# Patient Record
Sex: Female | Born: 1950 | Race: Black or African American | Hispanic: No | Smoking: Never smoker
Health system: Southern US, Community
[De-identification: ages and names within clinical notes are randomized; demographics above are authoritative.]

## PROBLEM LIST (undated history)

## (undated) DIAGNOSIS — K219 Gastro-esophageal reflux disease without esophagitis: Secondary | ICD-10-CM

## (undated) DIAGNOSIS — D126 Benign neoplasm of colon, unspecified: Secondary | ICD-10-CM

## (undated) DIAGNOSIS — M810 Age-related osteoporosis without current pathological fracture: Secondary | ICD-10-CM

## (undated) DIAGNOSIS — K5790 Diverticulosis of intestine, part unspecified, without perforation or abscess without bleeding: Secondary | ICD-10-CM

## (undated) DIAGNOSIS — I10 Essential (primary) hypertension: Secondary | ICD-10-CM

## (undated) DIAGNOSIS — K644 Residual hemorrhoidal skin tags: Secondary | ICD-10-CM

## (undated) DIAGNOSIS — K635 Polyp of colon: Secondary | ICD-10-CM

## (undated) DIAGNOSIS — K2100 Gastro-esophageal reflux disease with esophagitis, without bleeding: Secondary | ICD-10-CM

## (undated) DIAGNOSIS — K449 Diaphragmatic hernia without obstruction or gangrene: Secondary | ICD-10-CM

## (undated) DIAGNOSIS — K648 Other hemorrhoids: Secondary | ICD-10-CM

## (undated) DIAGNOSIS — M1990 Unspecified osteoarthritis, unspecified site: Secondary | ICD-10-CM

## (undated) HISTORY — DX: Diverticulosis of intestine, part unspecified, without perforation or abscess without bleeding: K57.90

## (undated) HISTORY — DX: Essential (primary) hypertension: I10

## (undated) HISTORY — PX: SPINE SURGERY: SHX786

## (undated) HISTORY — DX: Benign neoplasm of colon, unspecified: D12.6

## (undated) HISTORY — DX: Residual hemorrhoidal skin tags: K64.4

## (undated) HISTORY — PX: COLONOSCOPY: SHX174

## (undated) HISTORY — DX: Other hemorrhoids: K64.8

## (undated) HISTORY — DX: Gastro-esophageal reflux disease with esophagitis, without bleeding: K21.00

## (undated) HISTORY — PX: UPPER GASTROINTESTINAL ENDOSCOPY: SHX188

## (undated) HISTORY — DX: Diaphragmatic hernia without obstruction or gangrene: K44.9

## (undated) HISTORY — DX: Gastro-esophageal reflux disease without esophagitis: K21.9

## (undated) HISTORY — DX: Unspecified osteoarthritis, unspecified site: M19.90

## (undated) HISTORY — DX: Polyp of colon: K63.5

## (undated) HISTORY — DX: Age-related osteoporosis without current pathological fracture: M81.0

## (undated) HISTORY — PX: POLYPECTOMY: SHX149

## (undated) HISTORY — DX: Gastro-esophageal reflux disease with esophagitis: K21.0

---

## 2007-03-08 ENCOUNTER — Encounter: Payer: Self-pay | Admitting: Gastroenterology

## 2007-03-14 ENCOUNTER — Encounter: Payer: Self-pay | Admitting: Gastroenterology

## 2009-08-12 ENCOUNTER — Encounter (INDEPENDENT_AMBULATORY_CARE_PROVIDER_SITE_OTHER): Payer: Self-pay | Admitting: *Deleted

## 2009-09-19 ENCOUNTER — Ambulatory Visit: Payer: Self-pay | Admitting: Gastroenterology

## 2009-09-19 DIAGNOSIS — R933 Abnormal findings on diagnostic imaging of other parts of digestive tract: Secondary | ICD-10-CM

## 2009-09-19 DIAGNOSIS — R12 Heartburn: Secondary | ICD-10-CM

## 2009-09-19 HISTORY — DX: Abnormal findings on diagnostic imaging of other parts of digestive tract: R93.3

## 2009-09-23 LAB — CONVERTED CEMR LAB
Eosinophils Relative: 2.5 % (ref 0.0–5.0)
HCT: 37 % (ref 36.0–46.0)
Monocytes Relative: 7.5 % (ref 3.0–12.0)
Neutrophils Relative %: 70.1 % (ref 43.0–77.0)
Platelets: 277 10*3/uL (ref 150.0–400.0)
WBC: 10.5 10*3/uL (ref 4.5–10.5)

## 2009-09-24 ENCOUNTER — Telehealth (INDEPENDENT_AMBULATORY_CARE_PROVIDER_SITE_OTHER): Payer: Self-pay | Admitting: *Deleted

## 2009-09-29 ENCOUNTER — Encounter (INDEPENDENT_AMBULATORY_CARE_PROVIDER_SITE_OTHER): Payer: Self-pay | Admitting: *Deleted

## 2009-11-03 ENCOUNTER — Encounter (INDEPENDENT_AMBULATORY_CARE_PROVIDER_SITE_OTHER): Payer: Self-pay | Admitting: *Deleted

## 2009-11-05 ENCOUNTER — Ambulatory Visit: Payer: Self-pay | Admitting: Gastroenterology

## 2009-11-19 ENCOUNTER — Ambulatory Visit: Payer: Self-pay | Admitting: Gastroenterology

## 2010-03-28 ENCOUNTER — Emergency Department (HOSPITAL_COMMUNITY)
Admission: EM | Admit: 2010-03-28 | Discharge: 2010-03-28 | Payer: Self-pay | Source: Home / Self Care | Admitting: Emergency Medicine

## 2010-04-21 NOTE — Letter (Signed)
Summary: New Patient letter  [facility]  [address] [place], [place] [address]   Phone: [phone]  Fax: [phone]       08/12/2009 MRN: [id number]  [facility] [address] [place], [place]  [address]  Dear Maria [name],  Welcome to the Gastroenterology Division at [facility].    You are scheduled to see Dr.  Christella Hartigan on 09-16-09 at 2:30p.m. on the 3rd floor at [facility], [address].  We ask that you try to arrive at our office 15 minutes prior to your appointment time to allow for check-in.  We would like you to complete the enclosed self-administered evaluation form prior to your visit and bring it with you on the day of your appointment.  We will review it with you.  Also, please bring a complete list of all your medications or, if you prefer, bring the medication bottles and we will list them.  Please bring your insurance card so that we may make a copy of it.  If your insurance requires a referral to see a specialist, please bring your referral form from your primary care physician.  Co-payments are due at the time of your visit and may be paid by cash, check or credit card.     Your office visit will consist of a consult with your physician (includes a physical exam), any laboratory testing he/she may order, scheduling of any necessary diagnostic testing (e.g. x-ray, ultrasound, CT-scan), and scheduling of a procedure (e.g. Endoscopy, Colonoscopy) if required.  Please allow enough time on your schedule to allow for any/all of these possibilities.    If you cannot keep your appointment, please call [phone] to cancel or reschedule prior to your appointment date.  This allows [place] the opportunity to schedule an appointment for another patient in need of care.  If you do not cancel or reschedule by 5 p.m. the business day prior to your appointment date, you will be charged a $50.00 late cancellation/no-show fee.    Thank you for choosing [facility] for your medical needs.  We appreciate the opportunity to care for you.  Please visit [place] at our website  to learn more about our practice.                     Sincerely,                                                             The Gastroenterology Division

## 2010-04-21 NOTE — Letter (Signed)
Summary: EGD Instructions  [facility]  [address] [place], [place] [address]   Phone: [phone]  Fax: [phone]       [name]    06-02-1950    MRN: [id number]       Procedure Day Dorna Bloom:  [facility]   11/19/09     Arrival Time: 10:30AM     Procedure Time: 11:30AM     Location of Procedure:                    _X _ [facility] (4th Floor)    PREPARATION FOR ENDOSCOPY   On Wed. 11/19/09 THE DAY OF THE PROCEDURE:  1.   No solid foods, milk or milk products are allowed after midnight the night before your procedure.  2.   Do not drink anything colored red or purple.  Avoid juices with pulp.  No orange juice.  3.  You may drink clear liquids until 9:30AM, which is 2 hours before your procedure.                                                                                                CLEAR LIQUIDS INCLUDE: Water Jello Ice Popsicles Tea (sugar ok, no milk/cream) Powdered fruit flavored drinks Coffee (sugar ok, no milk/cream) Gatorade Juice: apple, white grape, white cranberry  Lemonade Clear bullion, consomm, broth Carbonated beverages (any kind) Strained chicken noodle soup Hard Candy   MEDICATION INSTRUCTIONS  Unless otherwise instructed, you should take regular prescription medications with a small sip of water as early as possible the morning of your procedure.        OTHER INSTRUCTIONS  You will need a responsible adult at least 61 years of age to accompany you and drive you home.   This person must remain in the waiting room during your procedure.  Wear loose fitting clothing that is easily removed.  Leave jewelry and other valuables at home.  However, you may wish to bring a book to read or an iPod/MP3 player to listen to music as you wait for your procedure to start.  Remove all body piercing jewelry and leave at home.  Total time from sign-in until discharge is approximately 2-3 hours.  You should go home  directly after your procedure and rest.  You can resume normal activities the day after your procedure.  The day of your procedure you should not:   Drive   Make legal decisions   Operate machinery   Drink alcohol   Return to work  You will receive specific instructions about eating, activities and medications before you leave.    The above instructions have been reviewed and explained to me by   Wyona Almas RN  November 05, 2009 3:52 PM     I fully understand and can verbalize these instructions _____________________________ Date _________

## 2010-04-21 NOTE — Miscellaneous (Signed)
Summary: [facility] PREVISIT/PREP  Clinical Lists Changes  Observations: Added new observation of NKA: T (11/05/2009 15:22)

## 2010-04-21 NOTE — Miscellaneous (Signed)
Summary: rx  Clinical Lists Changes  Medications: Added new medication of PRILOSEC 20 MG  CPDR (OMEPRAZOLE) 1 twice a day 30 minutes before meals - Signed Rx of PRILOSEC 20 MG  CPDR (OMEPRAZOLE) 1 twice a day 30 minutes before meals;  #60 x 11;  Signed;  Entered by: Rachael Fee MD;  Authorized by: Rachael Fee MD;  Method used: Electronically to [facility] # [phone]*, [address] [place], [place]  [address], Ph: [phone], Fax: [phone]    Prescriptions: PRILOSEC 20 MG  CPDR (OMEPRAZOLE) 1 twice a day 30 minutes before meals  #60 x 11   Entered and Authorized by:   Rachael Fee MD   Signed by:   Rachael Fee MD on 11/19/2009   Method used:   Electronically to        [facility] # [phone]* (retail)       [address]       [place], [place]  [address]       Ph: [phone]       Fax: [phone]   RxID:   [id number]

## 2010-04-21 NOTE — Letter (Signed)
Summary: Previsit letter  [facility]  [address] [place], [place] [address]   Phone: [phone]  Fax: [phone]       09/29/2009 MRN: [id number]  [facility] [address] [place], [place]  [address]  Dear Ms. Fuston,  Welcome to the Gastroenterology Division at [facility].    You are scheduled to see a nurse for your pre-procedure visit on 10/07/09 at 2:30 pm on the 3rd floor at [facility], [address].  We ask that you try to arrive at our office 15 minutes prior to your appointment time to allow for check-in.  Your nurse visit will consist of discussing your medical and surgical history, your immediate family medical history, and your medications.    Please bring a complete list of all your medications or, if you prefer, bring the medication bottles and we will list them.  We will need to be aware of both prescribed and over the counter drugs.  We will need to know exact dosage information as well.  If you are on blood thinners (Coumadin, Plavix, Aggrenox, Ticlid, etc.) please call our office today/prior to your appointment, as we need to consult with your physician about holding your medication.   Please be prepared to read and sign documents such as consent forms, a financial agreement, and acknowledgement forms.  If necessary, and with your consent, a friend or relative is welcome to sit-in on the nurse visit with you.  Please bring your insurance card so that we may make a copy of it.  If your insurance requires a referral to see a specialist, please bring your referral form from your primary care physician.  No co-pay is required for this nurse visit.     If you cannot keep your appointment, please call [phone] to cancel or reschedule prior to your appointment date.  This allows [place] the opportunity to schedule an appointment for another patient in need of care.    Thank you for choosing [facility] for your medical  needs.  We appreciate the opportunity to care for you.  Please visit [place] at our website  to learn more about our practice.                     Sincerely.                                                                                                                   The Gastroenterology Division

## 2010-04-21 NOTE — Progress Notes (Signed)
Summary: [facility] -- Repeat GI  Phone Note Outgoing Call Call back at  [phone]    Call placed by: Chales Abrahams CMA Duncan Dull),  September 24, 2009 1:09 PM Call placed to: [facility] Summary of Call: left message on machine to call back regarding EGD Initial call taken by: Chales Abrahams CMA Duncan Dull),  September 24, 2009 1:11 PM  Follow-up for Phone Call        records recieved on Dr Christella Hartigan desk Follow-up by: Chales Abrahams CMA Duncan Dull),  September 24, 2009 4:20 PM

## 2010-04-21 NOTE — Procedures (Signed)
Summary: Colonoscopy/[facility]  Colonoscopy/[facility]   Imported By: Sherian Rein 10/06/2009 14:00:46  _____________________________________________________________________  External Attachment:    Type:   Image     Comment:   External Document

## 2010-04-21 NOTE — Procedures (Signed)
Summary: Upper Endoscopy  Patient: Maria [name] Note: All result statuses are Final unless otherwise noted.  Tests: (1) Upper Endoscopy (EGD)   EGD Upper Endoscopy       DONE     [facility]     [address]     [place], [place]  [address]           ENDOSCOPY PROCEDURE REPORT     PATIENT:  Maria, [name]  MR#:  [id number]     BIRTHDATE:  [birth year], 58 yrs. old  GENDER:  female     ENDOSCOPIST:  Rachael Fee, MD     PROCEDURE DATE:  11/19/2009     PROCEDURE:  EGD, diagnostic     ASA CLASS:  Class II     INDICATIONS:  02/2007 EGD done for pyrosis, dyspepsia found a GE     junction ulcer, esophagitis.  biopsies showed inflammation,     ulceration and she was recommended to have repeat EGD at 2 month     interval.     MEDICATIONS:   Fentanyl 50 mcg IV, Versed 6 mg IV     TOPICAL ANESTHETIC:  Exactacain Spray     DESCRIPTION OF PROCEDURE:   After the risks benefits and     alternatives of the procedure were thoroughly explained, informed     consent was obtained.  The [facility] GIF-H180 [id number] endoscope was     introduced through the mouth and advanced to the second portion of     the duodenum, without limitations.  The instrument was slowly     withdrawn as the mucosa was fully examined.     <<PROCEDUREIMAGES>>     There was reflux esophagitis (1cmlong linear erosions at GE     junction arranged in a spoke-like fashion) (see image6 and     image7).  Otherwise the examination was normal (see image5,     image3, image2, and image4).    Retroflexed views revealed no     abnormalities.    The scope was then withdrawn from the patient     and the procedure completed.     COMPLICATIONS:  None           ENDOSCOPIC IMPRESSION:     1) Reflux esophagitis, linear erosions     2) Otherwise normal examination           RECOMMENDATIONS:     Given erosive esophagitis despite once daily PPI, she should     increase to twice daily.  Best to take this 20-30 min prior to     breakfast  meal daily.  Script called in for BID prilosec.           ______________________________     Rachael Fee, MD           n.     eSIGNED:   Rachael Fee at 11/19/2009 11:27 AM           Clyda Greener, [id number]  Note: An exclamation mark (!) indicates a result that was not dispersed into the flowsheet. Document Creation Date: 11/19/2009 11:27 AM _______________________________________________________________________  (1) Order result status: Final Collection or observation date-time: 11/19/2009 11:19 Requested date-time:  Receipt date-time:  Reported date-time:  Referring Physician:   Ordering Physician: Rob Bunting [id number]) Specimen Source:  Source: Launa Grill Order Number: [id number] Lab site:

## 2010-04-21 NOTE — Procedures (Signed)
Summary: Upper GI Endoscopy/[facility]  Upper GI Endoscopy/[facility]   Imported By: Sherian Rein 10/06/2009 14:03:24  _____________________________________________________________________  External Attachment:    Type:   Image     Comment:   External Document

## 2010-04-21 NOTE — Assessment & Plan Note (Signed)
Summary: ACID REFLUX/YF   History of Present Illness Visit Type: Initial Visit Primary GI [name]: Maria [name] Chief Complaint: acid reflux History of Present Illness:     very pleasant 60 year old woman who recently moved from [place], [place].  She has seen gastroenterologist previously.  She has had colon polyps removed at least twice: the most recent colonoscopy was maybe 3 years ago.    She believes she has had  diverticulosis.  She has chronic reflux problems.  Acid regurg symptoms.   She does not take anti-acid medicine. She has been on PPIs in the past.  These may or may not have helped in the past. Diet control, she avoidd chocolate, sodas and fried foods.  Her symptoms are under good control iwth that diet.  Overall stable weight.  No dysphagia.  She will have nocturnal acid symptoms at times.  She is bothered about 2-3 times per week.  She does not take any meds but she will eat an apple.  she has had no overt GI bleeding. She has had no significant changes in her bowel habits.  she told me that she wants an upper endoscopy as well as a colonoscopy performed.           Current Medications (verified): 1)  None  Allergies (verified): No Known Drug Allergies  Past History:  Past Medical History: arthritis Diverticulosis Reported colon polyps, unclear pathology Hypertension Urinary tract infection  Past Surgical History: none  Family History: no colon cancer  Social History: she is widowed, she has 3 children, she works as a Occupational hygienist, she does not smoke cigarettes or drink alcohol.  Review of Systems       Pertinent positive and negative review of systems were noted in the above HPI and GI specific review of systems.  All other review of systems was otherwise negative.   Vital Signs:  Patient profile:   60 year old female Height:      66.5 inches Weight:      187.13 pounds BMI:     29.86 Pulse rate:   68 / minute Pulse rhythm:   regular BP  sitting:   128 / 66  (left arm)  Vitals Entered By: Maria [name] NCMA (September 19, 2009 1:44 PM)  Physical Exam  Additional Exam:  Constitutional: generally well appearing Psychiatric: alert and oriented times 3 Eyes: extraocular movements intact Mouth: oropharynx moist, no lesions Neck: supple, no lymphadenopathy Cardiovascular: heart regular rate and rythm Lungs: CTA bilaterally Abdomen: soft, non-tender, non-distended, no obvious ascites, no peritoneal signs, normal bowel sounds Extremities: no lower extremity edema bilaterally Skin: no lesions on visible extremities    Impression & Recommendations:  Problem # 1:  Chronic GERD, history of polyps in her colon I will get records sent over from her previous colonoscopies, upper endoscopies from [facility] She said she wants another look at her stomach and in her colon however is not clear that either of these procedures is really indicated at this point. She has chronic GERD that is mainly diet controlled. She has no alarm symptoms. She has no new changes in her bowels. She will get a CBC to see if she is anemic and that might change my plans however other than that I have recommended that she be on over-the-counter Prilosec once daily 20-30 minutes before dinner meal and I have given her a GERD handout. We will contact her after reviewing her previous colonoscopy, upper endoscopies.  Other Orders: TLB-CBC Platelet -  w/Differential ([id number]-[id number])  Patient Instructions: 1)  We will get records sent over from previous [facility] (colonoscopies and upper endoscopeis). These records will help determine if/when you need repeat examinations. 2)  GERD handout given. 3)  You will get lab test(s) done today (cbc). 4)  Start OTC prilosec, one pill 20-30 min prior to dinner meal, every night. 5)  The medication list was reviewed and reconciled.  All changed / newly prescribed medications were explained.  A complete  medication list was provided to the patient / caregiver.  Appended Document: ACID REFLUX/YF received faxed records:  02/2007 colonoscopy done for personal history of TAs, found hemorrhoids, pan-diverticulosis, 3mm polyp that was adenomatous on pathology 02/2007 EGD done for pyrosis, dyspepsia found a GE junction ulcer, esophagitis.  biopsies showed inflammation, ulceration and she was recommended to have repeat EGD at 2 month interval.  These were done at [facility] by DR. Edwena [name], [name].  patty, can you put her in for colonsocopy recall in 02/2012 (let her know).  also please call the [facility] and ask if she ever had that REPEAT EGD that they recommended (would be sometime in 2009 probably) or any other EGDs since the 12.2008.  If she has not, then she will need an EGD with me in next few weeks.    Appended Document: ACID REFLUX/YF she needs EGD in [facility] to follow up the ulcer noted on 02/2007 EGD (was recommended to have repeat by outside GI [name] 2-3 years ago, hasn't had it done from what I can tell)  Appended Document: ACID REFLUX/YF pt scheduled for EGD 10/15/09 previsit for 10/06/09

## 2010-07-21 ENCOUNTER — Encounter (HOSPITAL_COMMUNITY)
Admission: RE | Admit: 2010-07-21 | Discharge: 2010-07-21 | Disposition: A | Discharge status: Home / Self Care | Payer: BC Managed Care – PPO | Source: Ambulatory Visit | Attending: Orthopedic Surgery | Admitting: Orthopedic Surgery

## 2010-07-21 ENCOUNTER — Other Ambulatory Visit (HOSPITAL_COMMUNITY): Payer: Self-pay | Admitting: Orthopedic Surgery

## 2010-07-21 DIAGNOSIS — M5000 Cervical disc disorder with myelopathy, unspecified cervical region: Secondary | ICD-10-CM

## 2010-07-21 LAB — URINE MICROSCOPIC-ADD ON

## 2010-07-21 LAB — COMPREHENSIVE METABOLIC PANEL
Albumin: 3.8 g/dL (ref 3.5–5.2)
Alkaline Phosphatase: 98 U/L (ref 39–117)
BUN: 13 mg/dL (ref 6–23)
Chloride: 101 mEq/L (ref 96–112)
Glucose, Bld: 79 mg/dL (ref 70–99)
Potassium: 3.8 mEq/L (ref 3.5–5.1)
Total Bilirubin: 0.3 mg/dL (ref 0.3–1.2)

## 2010-07-21 LAB — CBC
Hemoglobin: 13.3 g/dL (ref 12.0–15.0)
MCH: 30.3 pg (ref 26.0–34.0)
MCV: 89.5 fL (ref 78.0–100.0)
RBC: 4.39 MIL/uL (ref 3.87–5.11)

## 2010-07-21 LAB — DIFFERENTIAL
Lymphs Abs: 1.9 10*3/uL (ref 0.7–4.0)
Monocytes Relative: 7 % (ref 3–12)
Neutro Abs: 5.6 10*3/uL (ref 1.7–7.7)
Neutrophils Relative %: 66 % (ref 43–77)

## 2010-07-21 LAB — URINALYSIS, ROUTINE W REFLEX MICROSCOPIC
Glucose, UA: NEGATIVE mg/dL
Protein, ur: NEGATIVE mg/dL
pH: 5 (ref 5.0–8.0)

## 2010-07-21 LAB — SURGICAL PCR SCREEN: Staphylococcus aureus: NEGATIVE

## 2010-07-21 LAB — TYPE AND SCREEN: ABO/RH(D): A POS

## 2010-07-22 ENCOUNTER — Ambulatory Visit (HOSPITAL_COMMUNITY): Payer: BC Managed Care – PPO

## 2010-07-22 ENCOUNTER — Observation Stay (HOSPITAL_COMMUNITY)
Admission: RE | Admit: 2010-07-22 | Discharge: 2010-07-23 | Disposition: A | Discharge status: Home / Self Care | Payer: BC Managed Care – PPO | Source: Ambulatory Visit | Attending: Orthopedic Surgery | Admitting: Orthopedic Surgery

## 2010-07-22 DIAGNOSIS — M5030 Other cervical disc degeneration, unspecified cervical region: Principal | ICD-10-CM | POA: Insufficient documentation

## 2010-07-22 DIAGNOSIS — Z01812 Encounter for preprocedural laboratory examination: Secondary | ICD-10-CM | POA: Insufficient documentation

## 2010-07-22 DIAGNOSIS — Z01818 Encounter for other preprocedural examination: Secondary | ICD-10-CM | POA: Insufficient documentation

## 2010-07-22 DIAGNOSIS — Z01810 Encounter for preprocedural cardiovascular examination: Secondary | ICD-10-CM | POA: Insufficient documentation

## 2010-07-26 NOTE — Op Note (Signed)
Maria [name], Maria [name]                ACCOUNT NO.:  [id number]  MEDICAL RECORD NO.:  [id number]           PATIENT TYPE:  O  LOCATION:  5025                         FACILITY:  [facility]  PHYSICIAN:  Estill Bamberg, MD      DATE OF BIRTH:  [birth year]  DATE OF PROCEDURE:  07/22/2010 DATE OF DISCHARGE:                              OPERATIVE REPORT   PREOPERATIVE DIAGNOSES: 1. Right-sided cervical radiculopathy. 2. C5-6, C6-7 degenerative disk disease.  POSTOPERATIVE DIAGNOSES: 1. Right-sided cervical radiculopathy. 2. C5-6, C6-7 degenerative disk disease.  PROCEDURES: 1. C5-6, C6-7 anterior cervical decompression and fusion. 2. Placement of anterior instrumentation C5-C7. 3. Use of local autograft. 4. Use of structural allograft x2. 5. Intraoperative use of fluoroscopy.  SURGEON:  Estill Bamberg, MD  ASSISTANT:  Janace Litten, OPA  ESTIMATED BLOOD LOSS:  Minimal.  COMPLICATIONS:  None.  DISPOSITION:  Stable.  INDICATIONS FOR PROCEDURE:  Briefly, Maria [name] is a very pleasant 60- year-old female who was sent to me at the request of Dr. Modesto Charon in consultation.  The patient did present to my office on July 03, 2010, with symptoms related to pain in her right arm.  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  The patient at the time of my evaluation did fail extensive conservative care in the form of antiinflammatories pain medications in addition to steroid injections and physical therapy and she went on to continue complaint of ongoing pain in the right arm.  We therefore did have a discussion regarding going forward with a C5-6 and a C6-7 anterior cervical decompression and fusion as noted above.  The patient is fully understood the risks and benefits of the procedure as outlined in my preoperative note.  OPERATIVE DETAILS:  On Jul 22, 2010, the patient  was brought to surgery and general endotracheal anesthesia was administered.  Antibiotics were given and SCDs were placed.  The patient was placed supine on a hospital bed.  A time-out procedure was performed.  I did bring in a lateral fluoroscopy in order to help optimize location of my incision.  The neck was then prepped and draped in usual sterile fashion and the shoulders were taped to the inferior aspect of the bed.  The neck was placed into a gentle degree of extension.  Again, the neck was prepped and draped in the usual sterile fashion.  I then made a transverse incision from the midline to the medial border of the sternocleidomastoid muscle.  The plane between the carotid sheath laterally and the strap muscles medially was identified and explored and the anterior cervical spine was readily identified.  I did subperiosteally exposed the vertebral bodies of C5, C6 and C7 and a self-retaining Shadow-Line retractor was placed. I did first turned my attention towards the C6-7 interspace.  A 15 blade knife was used to perform annulotomy anteriorly and a preliminary diskectomy was performed.  I then placed Caspar pins into the C6 and C7 vertebral bodies and gentle distraction  was applied across the Bono pins.  I then used a 3-mm high-speed bur to perform the posterior aspect of the diskectomy.  The posterior longitudinal ligament was then readily identified and was taken down using a nerve hook.  I then used a #1 and #2 Kerrison to take down the posterior longitudinal ligament from the midline out to the right neural foramen.  I was easily able to pass a nerve hook out to the right neural foramen.  I then used a series of rasps and curettes to prepare the endplates of C6 and C7.  I then felt that a 7-mm intervertebral graft will be the most appropriate fit and a 7-mm graft from musculoskeletal transplant foundation was hydrated and tamped into position in the usual fashion.  Of note,  prior to placing the allograft, I did place the autograft obtained from the decortication aspect of the procedure above and below the graft to help optimize incorporation of the graft.  I then turned my attention towards the C6-7 interspace.  Caspar pins were applied across C6-7 interspace after removing the Caspar pin from the C7 vertebral body and placing bone wax. Gentle distraction was applied and a diskectomy was performed in a manner just described.  At the termination of the decompression, I was easily able to pass a nerve hook out the right C6-7 neural foramen.  I then selected a 6-mm allograft and this was again tamped into position after placing autograft above and below the allograft.  I was very happy with the final press fit at each intervertebral level.  I then chose an appropriately sized Synthes Vectra plate.  I did use AP and lateral fluoroscopic views to help optimize the placement of the plate.  I then selected 16 mm self-drilling, self-tapping screws and the screws were placed into the C5, C6 and C7 vertebral bodies in the usual fashion.  I was very happy with final purchase of the screws.  Again, I did use AP and lateral fluoroscopy to help optimize location of the plate placement.  At this point, I did copiously irrigated the wound using approximately 300 mL of normal saline.  The Caspar pins were removed. The bone wax was applied.  I then closed the platysma using 2-0 Vicryl. The skin was closed using 3-0 Monocryl and Steri-Strips, followed by sterile dressing was applied.  All instrument counts were correct at the termination of the procedure.  Of note, Janace Litten was my assistant throughout the procedure and aided in essential retraction and suction needed throughout the procedure.     Estill Bamberg, MD     MD/MEDQ  D:  07/22/2010  T:  07/22/2010  Job:  [id number]  cc:   Janace Hoard  Electronically Signed by Estill Bamberg  on 07/26/2010 12:32:25 PM

## 2010-07-26 NOTE — Discharge Summary (Signed)
  [name], Maria [name]                ACCOUNT NO.:  [id number]  MEDICAL RECORD NO.:  [id number]           PATIENT TYPE:  O  LOCATION:  5025                         FACILITY:  [facility]  PHYSICIAN:  Estill Bamberg, MD      DATE OF BIRTH:  1950-05-09  DATE OF ADMISSION:  07/22/2010 DATE OF DISCHARGE:  07/23/2010                              DISCHARGE SUMMARY   ADMISSION DIAGNOSES: 1. Right-sided cervical radiculopathy. 2. C5-6 and C6-7 degenerative disk disease.  DISCHARGE DIAGNOSES: 1. Right-sided cervical radiculopathy. 2. C5-6 and C6-7 degenerative disk disease.  PROCEDURE PERFORMED:  C5-6, C6-7 anterior cervical decompression and fusion on Jul 22, 2010.  ADMISSION HISTORY:  Briefly, [name] is a very pleasant 60 year old female who presented to my office with severe debilitating pain in her right arm.  An MRI did reveal neural foraminal stenosis at the C5-6 and C6-7 levels and the patient did fail conservative care.  Therefore, the patient was admitted on Jul 22, 2010, for the procedure noted above.  HOSPITAL COURSE:  On Jul 22, 2010, the patient was admitted, was brought to Surgery, and underwent the procedure noted above.  The patient tolerated the procedure well and was transferred to recovery then hopefully to the general floor in stable condition.  The patient was noted to be neurovascularly intact on postoperative evaluation and upon her evaluation on the morning of postop day #1.  Her pain was well- controlled and her radiculopathy was entirely improved.  A hard collar was fitting her appropriately.  Her pain was controlled with Percocet and Valium.  The patient was ambulating and tolerating a diet.  We therefore made a decision to discharge the patient home uneventfully.  DISCHARGE INSTRUCTIONS:  The patient will take Percocet for pain and Valium for spasms.  She will be maintained in her hard cervical collar at all times.  She was given a [place] collar to use for  showering. She will avoid lifting over 10 pounds and I will see her in followup in my office in approximately 2 weeks.     Estill Bamberg, MD     MD/MEDQ  D:  07/23/2010  T:  07/23/2010  Job:  [id number]  Electronically Signed by Estill Bamberg  on 07/26/2010 12:32:27 PM

## 2011-04-21 ENCOUNTER — Other Ambulatory Visit: Payer: Self-pay | Admitting: Obstetrics and Gynecology

## 2011-04-21 DIAGNOSIS — Z1231 Encounter for screening mammogram for malignant neoplasm of breast: Secondary | ICD-10-CM

## 2011-04-30 ENCOUNTER — Ambulatory Visit: Payer: BC Managed Care – PPO

## 2011-05-06 ENCOUNTER — Ambulatory Visit
Admission: RE | Admit: 2011-05-06 | Discharge: 2011-05-06 | Disposition: A | Discharge status: Home / Self Care | Payer: BC Managed Care – PPO | Source: Ambulatory Visit | Attending: Obstetrics and Gynecology | Admitting: Obstetrics and Gynecology

## 2011-05-06 DIAGNOSIS — Z1231 Encounter for screening mammogram for malignant neoplasm of breast: Secondary | ICD-10-CM

## 2011-07-13 ENCOUNTER — Ambulatory Visit (INDEPENDENT_AMBULATORY_CARE_PROVIDER_SITE_OTHER): Payer: BC Managed Care – PPO | Admitting: Family Medicine

## 2011-07-13 DIAGNOSIS — J301 Allergic rhinitis due to pollen: Secondary | ICD-10-CM

## 2011-07-13 MED ORDER — IPRATROPIUM BROMIDE 0.03 % NA SOLN: 2.0000 | Freq: Four times a day (QID) | NASAL | Status: DC

## 2011-07-13 MED ORDER — ALBUTEROL SULFATE HFA 108 (90 BASE) MCG/ACT IN AERS: 2.0000 | INHALATION_SPRAY | Freq: Four times a day (QID) | RESPIRATORY_TRACT | Status: DC | PRN

## 2011-07-13 NOTE — Progress Notes (Signed)
  Patient Name: Maria [name] Date of Birth: [birth year] Medical Record Number: [id number] Gender: female Date of Encounter: 07/13/2011  History of Present Illness:  Maria [name] is a 61 y.o. very pleasant female patient who presents with the following:  Here with sinus drainage for a couple of days.  Today feels better, but she did note some wheezing and poor appetite today.  Feels better today.   She had copious nasal drainage, sneezing yesterday.  Eyes are a little runny.  No fever, some cough but non- productive.    She has been taking Mucinex D.    She had surgery to relieve pressure on a nerve in her right arm almost a year ago and has done very well since this.   No GI symptoms.  No history of glaucoma   Patient Active Problem List  Diagnoses  . HEARTBURN  . NONSPECIFIC ABN FINDING RAD & OTH EXAM GI TRACT   No past medical history on file. No past surgical history on file. History  Substance Use Topics  . Smoking status: Never Smoker   . Smokeless tobacco: Not on file  . Alcohol Use: Not on file   No family history on file. No Known Allergies  Medication list has been reviewed and updated.  Review of Systems: As per HPI- otherwise negative.  Physical Examination: Filed Vitals:   07/13/11 1349  BP: 120/83  Pulse: 71  Temp: 98 F (36.7 C)  TempSrc: Oral  Resp: 16  Height: 5\' 5"  (1.651 m)  Weight: 178 lb (80.74 kg)    Body mass index is 29.62 kg/(m^2).  GEN: WDWN, NAD, Non-toxic, A & O x 3 HEENT: Atraumatic, Normocephalic. Neck supple. No masses, No LAD.  Tm, oropharynx wnl.  Nasal cavity congested and positive for stringy mucus typical of allergies Ears and Nose: No external deformity. CV: RRR, No M/G/R. No JVD. No thrill. No extra heart sounds. PULM: CTA B, no wheezes, crackles, rhonchi. No retractions. No resp. distress. No accessory muscle use EXTR: No c/c/e NEURO Normal gait.  PSYCH: Normally interactive. Conversant. Not depressed or anxious  appearing.  Calm demeanor.    Assessment and Plan: 1. Allergic rhinitis  albuterol (PROVENTIL HFA;VENTOLIN HFA) 108 (90 BASE) MCG/ACT inhaler, ipratropium (ATROVENT) 0.03 % nasal spray   Suspect that Maria [name] was having allergic rhinitis symptoms yesterday.  She is now feeling better- suggested that she try some claritin. If her symptoms come back again she may fill the rx listed above.  If any other problems or concerns please call me or RTC as needed

## 2012-03-22 DIAGNOSIS — K449 Diaphragmatic hernia without obstruction or gangrene: Secondary | ICD-10-CM | POA: Insufficient documentation

## 2012-03-22 HISTORY — DX: Diaphragmatic hernia without obstruction or gangrene: K44.9

## 2012-05-11 ENCOUNTER — Ambulatory Visit
Admission: RE | Admit: 2012-05-11 | Discharge: 2012-05-11 | Disposition: A | Discharge status: Home / Self Care | Payer: BC Managed Care – PPO | Source: Ambulatory Visit | Attending: Family Medicine | Admitting: Family Medicine

## 2012-05-11 ENCOUNTER — Ambulatory Visit (INDEPENDENT_AMBULATORY_CARE_PROVIDER_SITE_OTHER): Payer: BC Managed Care – PPO | Admitting: Family Medicine

## 2012-05-11 VITALS — BP 135/79 | HR 62 | Temp 98.0°F | Resp 16 | Ht 66.5 in | Wt 181.0 lb

## 2012-05-11 DIAGNOSIS — N390 Urinary tract infection, site not specified: Secondary | ICD-10-CM

## 2012-05-11 DIAGNOSIS — R109 Unspecified abdominal pain: Secondary | ICD-10-CM

## 2012-05-11 LAB — POCT URINALYSIS DIPSTICK
Bilirubin, UA: NEGATIVE
Glucose, UA: NEGATIVE
Leukocytes, UA: NEGATIVE
Nitrite, UA: NEGATIVE
Urobilinogen, UA: 0.2
pH, UA: 5.5

## 2012-05-11 LAB — POCT CBC
Granulocyte percent: 61.7 %G (ref 37–80)
Hemoglobin: 13.4 g/dL (ref 12.2–16.2)
MPV: 9.4 fL (ref 0–99.8)
POC Granulocyte: 4.8 (ref 2–6.9)
POC MID %: 6.1 %M (ref 0–12)
Platelet Count, POC: 323 10*3/uL (ref 142–424)
RBC: 4.64 M/uL (ref 4.04–5.48)
WBC: 7.8 10*3/uL (ref 4.6–10.2)

## 2012-05-11 LAB — COMPREHENSIVE METABOLIC PANEL
ALT: 16 U/L (ref 0–35)
AST: 20 U/L (ref 0–37)
CO2: 28 mEq/L (ref 19–32)
Calcium: 10.1 mg/dL (ref 8.4–10.5)
Chloride: 103 mEq/L (ref 96–112)
Potassium: 4.2 mEq/L (ref 3.5–5.3)
Sodium: 139 mEq/L (ref 135–145)
Total Protein: 7.4 g/dL (ref 6.0–8.3)

## 2012-05-11 LAB — POCT URINE PREGNANCY: Preg Test, Ur: NEGATIVE

## 2012-05-11 LAB — POCT UA - MICROSCOPIC ONLY
Casts, Ur, LPF, POC: NEGATIVE
Yeast, UA: NEGATIVE

## 2012-05-11 MED ORDER — CEPHALEXIN 500 MG PO CAPS: 500.0000 mg | ORAL_CAPSULE | Freq: Two times a day (BID) | ORAL | Status: DC

## 2012-05-11 MED ORDER — IOHEXOL 300 MG/ML  SOLN
100.0000 mL | Freq: Once | INTRAMUSCULAR | Status: AC | PRN
  Administered 2012-05-11: 100 mL via INTRAVENOUS

## 2012-05-11 MED ORDER — DICYCLOMINE HCL 20 MG PO TABS: 20.0000 mg | ORAL_TABLET | Freq: Four times a day (QID) | ORAL | Status: DC

## 2012-05-11 NOTE — Progress Notes (Addendum)
[facility] [address] [place] [address] [phone]  Date:  05/11/2012   Name:  Maria [name]   DOB:  April 16, 1950   MRN:  [id number]  PCP:  No primary provider on file.    Chief Complaint: Abdominal Pain and Medication Refill   History of Present Illness:  Maria [name] is a 62 y.o. very pleasant female patient who presents with the following:  She is here today with a concern about her stomach- she has noted symptoms for about 2 months.   She has noted that "I'm just not digesting my food."  She will note pain in her right side more after eating or drinking for the last 2 months.  She does have her gallbladder still.  She is concerned about this persistent pain No diarhea.  She does note reflux.  She uses vinegar or apples to help treat her symptoms.  Her stools can be hard balls, or normal.   On her past colonoscopies she has been told that she had polyps and diverticulosis.    She has never been diagnosed with diverticulitis in the past.   She will sometimes regurgitate a little bit of food.    Her last colonoscopy was about 5 years ago- it was done in [facility], she is not sure of the doctor.    She also needs a RF of her benicar.   She has noted some urinary frequency  Patient Active Problem List  Diagnosis  . HEARTBURN  . NONSPECIFIC ABN FINDING RAD & OTH EXAM GI TRACT    Past Medical History  Diagnosis Date  . Arthritis   . Hypertension   . GERD (gastroesophageal reflux disease)   . Colon polyps   . Diverticulosis     Past Surgical History  Procedure Laterality Date  . Spine surgery      Neck    History  Substance Use Topics  . Smoking status: Never Smoker   . Smokeless tobacco: Not on file  . Alcohol Use: Not on file    Family History  Problem Relation Age of Onset  . Arthritis Brother     No Known Allergies  Medication list has been reviewed and updated.  Current Outpatient Prescriptions on File Prior to Visit   Medication Sig Dispense Refill  . Multiple Vitamin (MULTIVITAMIN) tablet Take 1 tablet by mouth daily.       No current facility-administered medications on file prior to visit.    Review of Systems:  As per HPI- otherwise negative.   Physical Examination: Filed Vitals:   05/11/12 1214  BP: 135/79  Pulse: 62  Temp: 98 F (36.7 C)  Resp: 16   Filed Vitals:   05/11/12 1214  Height: 5' 6.5" (1.689 m)  Weight: 181 lb (82.101 kg)   Body mass index is 28.78 kg/(m^2). Ideal Body Weight: Weight in (lb) to have BMI = 25: 156.9  GEN: WDWN, NAD, Non-toxic, A & O x 3, mild overweight HEENT: Atraumatic, Normocephalic. Neck supple. No masses, No LAD.  Bilateral TM wnl, oropharynx normal.  PEERL,EOMI.   Ears and Nose: No external deformity. CV: RRR, No M/G/R. No JVD. No thrill. No extra heart sounds. PULM: CTA B, no wheezes, crackles, rhonchi. No retractions. No resp. distress. No accessory muscle use. ABD: S, ND, +BS. No rebound. No HSM. She has diffuse mild tenderness across her lower abdomen.  No RUQ tenderness or epigastric tenderness  EXTR: No c/c/e NEURO Normal gait.  PSYCH: Normally  interactive. Conversant. Not depressed or anxious appearing.  Calm demeanor.  Gu; normal exam for age, no discharge or CMT, no adnexal masses but she does have complaint of RLQ tenderness on BME.    Results for orders placed in visit on 05/11/12  POCT CBC      Result Value Range   WBC 7.8  4.6 - 10.2 K/uL   Lymph, poc 2.5  0.6 - 3.4   POC LYMPH PERCENT 32.2  10 - 50 %L   MID (cbc) 0.5  0 - 0.9   POC MID % 6.1  0 - 12 %M   POC Granulocyte 4.8  2 - 6.9   Granulocyte percent 61.7  37 - 80 %G   RBC 4.64  4.04 - 5.48 M/uL   Hemoglobin 13.4  12.2 - 16.2 g/dL   HCT, POC 16.1  09.6 - 47.9 %   MCV 92.2  80 - 97 fL   MCH, POC 28.9  27 - 31.2 pg   MCHC 31.3 (*) 31.8 - 35.4 g/dL   RDW, POC 04.5     Platelet Count, POC 323  142 - 424 K/uL   MPV 9.4  0 - 99.8 fL  POCT UA - MICROSCOPIC ONLY       Result Value Range   WBC, Ur, HPF, POC 0-2     RBC, urine, microscopic 1-8     Bacteria, U Microscopic 1+     Mucus, UA trace     Epithelial cells, urine per micros 0-2     Crystals, Ur, HPF, POC neg     Casts, Ur, LPF, POC neg     Yeast, UA neg    POCT URINALYSIS DIPSTICK      Result Value Range   Color, UA yellow     Clarity, UA clear     Glucose, UA neg     Bilirubin, UA neg     Ketones, UA neg     Spec Grav, UA 1.025     Blood, UA moderate     pH, UA 5.5     Protein, UA neg     Urobilinogen, UA 0.2     Nitrite, UA neg     Leukocytes, UA Negative    POCT URINE PREGNANCY      Result Value Range   Preg Test, Ur Negative       Assessment and Plan: Abdominal  pain, other specified site - Plan: POCT CBC, Comprehensive metabolic panel, POCT UA - Microscopic Only, POCT urinalysis dipstick, CT Abdomen Pelvis W Contrast, POCT urine pregnancy, dicyclomine (BENTYL) 20 MG tablet, Ambulatory referral to Gastroenterology  Urinary tract infection, site not specified - Plan: Urine culture, cephALEXin (KEFLEX) 500 MG capsule  CT scan as below- negative.  Will treat for a UTI with keflex while await urine culture.  Try bentyl for her GI discomfort.  Referral to GI as she is due for a colonoscopy- she was told to follow- up in 5 years and 5 years have passed. She had polyps at her last colonoscopy but they were benign  Meds ordered this encounter  Medications  . olmesartan (BENICAR) 20 MG tablet    Sig: Take 20 mg by mouth daily.  . cephALEXin (KEFLEX) 500 MG capsule    Sig: Take 1 capsule (500 mg total) by mouth 2 (two) times daily.    Dispense:  14 capsule    Refill:  0  . dicyclomine (BENTYL) 20 MG tablet    Sig: Take 1  tablet (20 mg total) by mouth every 6 (six) hours.    Dispense:  40 tablet    Refill:  0     Warner Laduca, MD  *RADIOLOGY REPORT*  Clinical Data: Right lower quadrant pain for several weeks  CT ABDOMEN AND PELVIS WITH CONTRAST  Technique: Multidetector  CT imaging of the abdomen and pelvis was performed following the standard protocol during bolus administration of intravenous contrast.  Contrast: OMNIPAQUE IOHEXOL 300 MG/ML SOLN  Comparison: None.  Findings: The lung bases are clear. The liver enhances with no focal abnormality and no ductal dilatation is seen. No calcified gallstones are noted. The pancreas is normal in size and the pancreatic duct is not dilated. The adrenal glands and spleen are unremarkable. The stomach is decompressed. The kidneys enhance with no calculus or mass and no hydronephrosis is seen. The abdominal aorta is normal in caliber. No adenopathy is seen.  The urinary bladder is not well distended but no abnormality is seen. The uterus is normal in size. No adnexal lesion is noted. There are scattered colonic diverticula diffusely throughout the colon present but no diverticulitis is seen. The appendix fills with air and no inflammatory process is seen. The terminal ileum is unremarkable. There is degenerative disc disease at the L5 S1 level.  IMPRESSION:  1. No explanation for the patient's right lower quadrant pain is seen. 2. The appendix and terminal ileum appear normal. 3. No renal or ureteral calculi noted. 4. Scattered colonic diverticula.

## 2012-05-11 NOTE — Patient Instructions (Addendum)
You can go to [facility] now for your scan drink contrast bottle # one now drink contrast bottle two at 2:45.    Driving directions to [address] [place], [place], [place] [address] [id number]  - more info    [address]  [place], [place] [address]     1. Head south on [address] toward [address]      0.5 mi    2. Sharp left onto [address]      0.6 mi    3. Turn left onto the [address] ramp      0.2 mi    4. Merge onto [address]      3.0 mi    5. Continue straight to stay on [address]      0.4 mi    6. Slight left to stay on [facility]  Destination will be on the right     1.0 mi     [address] [place], [place] [address]

## 2012-05-12 ENCOUNTER — Telehealth: Payer: Self-pay | Admitting: Family Medicine

## 2012-05-12 NOTE — Telephone Encounter (Signed)
Called to check on her- she is doing fine. No worse.  Told that CMP looked ok.  Plan to do pelvic ultrasound if sx persist on Monday, and catch up her colonoscopy asap.  She has had her pain for about 2 months and CT looked ok, so do not suspect acute or dangerous etiology.  An ovarian cyst is possible so will do [place] if sx persist. She will call me if she gets worse over the weekend

## 2012-05-15 ENCOUNTER — Telehealth: Payer: Self-pay | Admitting: Radiology

## 2012-05-15 ENCOUNTER — Telehealth: Payer: Self-pay | Admitting: Family Medicine

## 2012-05-15 ENCOUNTER — Ambulatory Visit
Admission: RE | Admit: 2012-05-15 | Discharge: 2012-05-15 | Disposition: A | Discharge status: Home / Self Care | Payer: BC Managed Care – PPO | Source: Ambulatory Visit | Attending: Family Medicine | Admitting: Family Medicine

## 2012-05-15 ENCOUNTER — Ambulatory Visit (INDEPENDENT_AMBULATORY_CARE_PROVIDER_SITE_OTHER): Payer: BC Managed Care – PPO | Admitting: Family Medicine

## 2012-05-15 VITALS — BP 157/82 | HR 66 | Temp 97.3°F | Resp 18 | Ht 66.5 in | Wt 183.4 lb

## 2012-05-15 DIAGNOSIS — R109 Unspecified abdominal pain: Secondary | ICD-10-CM

## 2012-05-15 DIAGNOSIS — R3129 Other microscopic hematuria: Secondary | ICD-10-CM

## 2012-05-15 LAB — POCT URINALYSIS DIPSTICK
Glucose, UA: NEGATIVE
Nitrite, UA: NEGATIVE
Protein, UA: NEGATIVE
Spec Grav, UA: 1.02
Urobilinogen, UA: 0.2
pH, UA: 5.5

## 2012-05-15 LAB — POCT UA - MICROSCOPIC ONLY
Bacteria, U Microscopic: NEGATIVE
Casts, Ur, LPF, POC: NEGATIVE
Crystals, Ur, HPF, POC: NEGATIVE
Mucus, UA: NEGATIVE
Yeast, UA: NEGATIVE

## 2012-05-15 NOTE — Telephone Encounter (Signed)
Discussed with Shawna Orleans who called patient for me.  Will call her back- as her pain is different, let's check her back in clinic today and then set her up for an ultrasound this afternoon.  Will make her NPO.

## 2012-05-15 NOTE — Telephone Encounter (Signed)
Spoke to pt, she is still experiencing pain in her left and right lower abdomen, and now a sharp vaginal pain?

## 2012-05-15 NOTE — Telephone Encounter (Signed)
Patient will need to get records. Wed 2:45 with Dr Elnoria Howard

## 2012-05-15 NOTE — Telephone Encounter (Signed)
Called and LMOM- ultrasound looked ok but I will investigate the fluid in her endometrium to be sure this is not significant.

## 2012-05-15 NOTE — Patient Instructions (Signed)
We will call GI and get an appointment for you ASAP.  If you get worse in the meantime please give me a call.

## 2012-05-15 NOTE — Progress Notes (Signed)
[facility] [address] [place] [address] [phone]  Date:  05/15/2012   Name:  Maria [name]   DOB:  01/30/1951   MRN:  [id number]  PCP:  No primary provider on file.    Chief Complaint: Follow-up and patient would not tell me what was wrong with her   History of Present Illness:  Maria [name] is a 62 y.o. very pleasant female patient who presents with the following:  Here for a follow- up of abdominal pain today.  She was here on 05/11/12 with several months of abdominal pain, worse in the lower quadrants, usually the RLQ. A CT scan was normal, CMP normal, CBC normal, and her urine culture came back as insignificant growth- we had started bentyl, prilosec and keflex for a possible UTI.  She was also concerned because at her colonoscopy about 5 years ago she was noted to have polyps and was asked to follow- up in 5 years.  However, her last colonoscopy was done in [facility] so she needed a referral to GI here in [place].    She has started prilosec OTC and bentyl.  However, she continues to notice RLQ pain after she eats.  (she does not have RUQ pain.) We called her today to check on her and she had complaint of vaginal pain so we had her come in for a recheck.  She describes occasional, "slight" pain on her labia. She had some sort of uterine polyp removed about 7 years ago and this reminds her of before she had this polyp removed.    No vomiting, she is eating fairly well but eaten nothing yet today.   She sometimes notes constipation, but no diarrhea No fever.    She does not yet notice any improvement since she started her bentyl and prilosec    Patient Active Problem List  Diagnosis  . HEARTBURN  . NONSPECIFIC ABN FINDING RAD & OTH EXAM GI TRACT    Past Medical History  Diagnosis Date  . Arthritis   . Hypertension   . GERD (gastroesophageal reflux disease)   . Colon polyps   . Diverticulosis     Past Surgical History  Procedure  Laterality Date  . Spine surgery      Neck    History  Substance Use Topics  . Smoking status: Never Smoker   . Smokeless tobacco: Not on file  . Alcohol Use: Not on file    Family History  Problem Relation Age of Onset  . Arthritis Brother     No Known Allergies  Medication list has been reviewed and updated.  Current Outpatient Prescriptions on File Prior to Visit  Medication Sig Dispense Refill  . cephALEXin (KEFLEX) 500 MG capsule Take 1 capsule (500 mg total) by mouth 2 (two) times daily.  14 capsule  0  . dicyclomine (BENTYL) 20 MG tablet Take 1 tablet (20 mg total) by mouth every 6 (six) hours.  40 tablet  0  . Multiple Vitamin (MULTIVITAMIN) tablet Take 1 tablet by mouth daily.      Marland Kitchen olmesartan (BENICAR) 20 MG tablet Take 20 mg by mouth daily.       No current facility-administered medications on file prior to visit.    Review of Systems:  As per HPI- otherwise negative.   Physical Examination: Filed Vitals:   05/15/12 1112  BP: 157/82  Pulse: 66  Temp: 97.3 F (36.3 C)  Resp: 18   Filed Vitals:  05/15/12 1112  Height: 5' 6.5" (1.689 m)  Weight: 183 lb 6.4 oz (83.19 kg)   Body mass index is 29.16 kg/(m^2). Ideal Body Weight: Weight in (lb) to have BMI = 25: 156.9  GEN: WDWN, NAD, Non-toxic, A & O x 3 HEENT: Atraumatic, Normocephalic. Neck supple. No masses, No LAD. Ears and Nose: No external deformity. CV: RRR, No M/G/R. No JVD. No thrill. No extra heart sounds. PULM: CTA B, no wheezes, crackles, rhonchi. No retractions. No resp. distress. No accessory muscle use. ABD: S, NT, ND, +BS. No rebound. No HSM. EXTR: No c/c/e NEURO Normal gait.  PSYCH: Normally interactive. Conversant. Not depressed or anxious appearing.  Calm demeanor.  GU: normal exam for age, no tenderness noted today, no CMT, no adnexal tenderness or abnormality noted  Results for orders placed in visit on 05/15/12  POCT UA - MICROSCOPIC ONLY      Result Value Range   WBC, Ur,  HPF, POC neg     RBC, urine, microscopic neg     Bacteria, U Microscopic neg     Mucus, UA neg     Epithelial cells, urine per micros 0-1     Crystals, Ur, HPF, POC neg     Casts, Ur, LPF, POC neg     Yeast, UA neg    POCT URINALYSIS DIPSTICK      Result Value Range   Color, UA yellow     Clarity, UA clear     Glucose, UA neg     Bilirubin, UA neg     Ketones, UA neg     Spec Grav, UA 1.020     Blood, UA moderate     pH, UA 5.5     Protein, UA neg     Urobilinogen, UA 0.2     Nitrite, UA neg     Leukocytes, UA Negative      Assessment and Plan: Microhematuria - Plan: POCT UA - Microscopic Only, POCT urinalysis dipstick  Abdominal  pain, other specified site - Plan: US Pelvis Complete, US Transvaginal Non-OB  Can D/C her keflex now.  Discussed her problem with her- at this time her exam is benign, so I think going to see GI would be the next best step.  However, she would also like to have an ultrasound to ensure her ovaries are normal.    Sent for an ultrasound today: *RADIOLOGY REPORT*  Clinical Data: Right lower quadrant pain.  TRANSABDOMINAL AND TRANSVAGINAL ULTRASOUND OF PELVIS Technique: Both transabdominal and transvaginal ultrasound examinations of the pelvis were performed. Transabdominal technique was performed for global imaging of the pelvis including uterus, ovaries, adnexal regions, and pelvic cul-de-sac.  It was necessary to proceed with endovaginal exam following the transabdominal exam to visualize the uterus, endometrium, ovaries and adnexal regions.  Comparison: CT abdomen pelvis 05/11/2012.  Findings:  Uterus: Measures 7.4 x 3.4 x 4.5 cm. Parenchymal echogenicity is uniform.  Endometrium: Measures 4 mm. Small amount of fluid is seen in the endometrial canal.  Right ovary: Measures 1.7 x 1.4 x 1.4 cm, negative.  Left ovary: Measures 2.1 x 1.6 x 1.5 cm, negative.  Other findings: No free fluid.  IMPRESSION: Small amount of fluid in the  endometrial canal. Otherwise, negative.  No cause for her pain noted on her ultrasound. Will refer to GI asap- we were able to make an appt for her on Wednesday.   Maria Amsterdam, MD

## 2012-05-15 NOTE — Telephone Encounter (Signed)
Appt made for Wed 2:45 Dr Elnoria Howard list of meds needed fax # [phone].

## 2012-05-15 NOTE — Telephone Encounter (Signed)
I spoke to patient about the appt I scheduled with Dr Elnoria Howard. It is for Wed at 2:45. Patient advised he needs records faxed over. She wants the # so she can call to reschedule. She is given this, but urged to keep the appt because Dr Patsy Lager wants her to go as soon as possible. She states she is having financial difficulty and may not go. I have advised her, again to keep the appt. She states she has a $70 copay and can not afford it. She was advised also to get her records sent over so they can review. She asked about her [place] advised will call her when this is reviewed. Amy

## 2012-05-16 ENCOUNTER — Telehealth: Payer: Self-pay | Admitting: Family Medicine

## 2012-05-16 DIAGNOSIS — R3129 Other microscopic hematuria: Secondary | ICD-10-CM

## 2012-05-16 NOTE — Telephone Encounter (Signed)
Called to speak to her about her pelvic ultrasound done yesterday.    She has been menopausal for nearly 10 years and has not noted any uterine bleeding.   However, she does have some endometrial fluid and her lining is 4 mm.    Endometrial bx may be indicated  TRANSABDOMINAL AND TRANSVAGINAL ULTRASOUND OF PELVIS  Technique: Both transabdominal and transvaginal ultrasound  examinations of the pelvis were performed. Transabdominal technique  was performed for global imaging of the pelvis including uterus,  ovaries, adnexal regions, and pelvic cul-de-sac.  It was necessary to proceed with endovaginal exam following the  transabdominal exam to visualize the uterus, endometrium, ovaries  and adnexal regions.  Comparison: CT abdomen pelvis 05/11/2012.  Findings:  Uterus: Measures 7.4 x 3.4 x 4.5 cm. Parenchymal echogenicity is  uniform.  Endometrium: Measures 4 mm. Small amount of fluid is seen in the  endometrial canal.  Right ovary: Measures 1.7 x 1.4 x 1.4 cm, negative.  Left ovary: Measures 2.1 x 1.6 x 1.5 cm, negative.  Other findings: No free fluid.  IMPRESSION:  Small amount of fluid in the endometrial canal. Otherwise,  negative.  Maria [name] is a pt of Dr. Normand Sloop at [facility]- will send an epic message to her and spoke with her nurse on the phone today.  Maria [name] will call and schedule an appt in the next few weeks.    Also discussed her urinary frequency which she has noted for about 2 weeks.  Her urine culture was negative on 05/11/12.  Her repeat UA showed moderate blood on the dip, but 0 rbc on micro.  Plan to have her repeat a UA/ micro here in about 2 weeks- I will leave an order on her chart.

## 2012-05-23 ENCOUNTER — Other Ambulatory Visit: Payer: Self-pay

## 2012-05-23 DIAGNOSIS — N920 Excessive and frequent menstruation with regular cycle: Secondary | ICD-10-CM

## 2012-05-30 ENCOUNTER — Encounter: Payer: Self-pay | Admitting: Obstetrics and Gynecology

## 2012-05-30 ENCOUNTER — Ambulatory Visit: Payer: BC Managed Care – PPO

## 2012-05-30 ENCOUNTER — Other Ambulatory Visit: Payer: Self-pay | Admitting: Obstetrics and Gynecology

## 2012-05-30 ENCOUNTER — Ambulatory Visit: Payer: BC Managed Care – PPO | Admitting: Obstetrics and Gynecology

## 2012-05-30 VITALS — BP 122/80 | Ht 66.0 in | Wt 182.0 lb

## 2012-05-30 DIAGNOSIS — N920 Excessive and frequent menstruation with regular cycle: Secondary | ICD-10-CM

## 2012-05-30 DIAGNOSIS — N857 Hematometra: Secondary | ICD-10-CM

## 2012-05-30 NOTE — Progress Notes (Signed)
Pt presented for [facility] and [place] because she had a pelvic US for pain and fluid was seen in the uterus.  She denies any PMVB.   SHG/EMBX:  The patient was consented for both procedures.  She was placed in dorsal lithotomy position and speculum placed in the vagina.  The cervix was cleansed with three betadine swabs.  The endometrial pipet was placed in the the endometrial cavity through the cervix.  The uterus did sound to 7cm.  The pipet was removed and specimen was sent to pathology.  The sonohysterography catheter was then placed through the cervix and vaginal probe placed back in the vagina. A small 0.53 cm calcified mass was visualized at the fundus c/w calcified fibroid Will RT 2 weeks for annual Pt offered a hysteroscopy to further identify the mass.  She will let me know at the NV

## 2012-06-02 ENCOUNTER — Telehealth: Payer: Self-pay

## 2012-06-02 NOTE — Telephone Encounter (Signed)
Message copied by Rolla Plate on Fri Jun 02, 2012 11:13 AM ------      Message from: Maria [name]      Created: Thu Jun 01, 2012 11:56 PM       Please call the patient and let her know her endometrial biopsy is normal ------

## 2012-06-02 NOTE — Telephone Encounter (Signed)
Lm on v tcb rgd labs

## 2012-06-02 NOTE — Telephone Encounter (Signed)
Spoke with pt rgd labs informed endo biopsy wnl pt voice understanding 

## 2012-08-16 ENCOUNTER — Encounter: Payer: Self-pay | Admitting: Family Medicine

## 2013-01-25 ENCOUNTER — Encounter: Payer: Self-pay | Admitting: *Deleted

## 2013-04-11 ENCOUNTER — Other Ambulatory Visit: Payer: Self-pay | Admitting: Gastroenterology

## 2013-04-11 DIAGNOSIS — R109 Unspecified abdominal pain: Secondary | ICD-10-CM

## 2013-04-26 ENCOUNTER — Encounter (HOSPITAL_COMMUNITY)
Admission: RE | Admit: 2013-04-26 | Discharge: 2013-04-26 | Disposition: A | Discharge status: Home / Self Care | Payer: BC Managed Care – PPO | Source: Ambulatory Visit | Attending: Gastroenterology | Admitting: Gastroenterology

## 2013-04-26 ENCOUNTER — Ambulatory Visit (HOSPITAL_COMMUNITY)
Admission: RE | Admit: 2013-04-26 | Discharge: 2013-04-26 | Disposition: A | Discharge status: Home / Self Care | Payer: BC Managed Care – PPO | Source: Ambulatory Visit | Attending: Gastroenterology | Admitting: Gastroenterology

## 2013-04-26 DIAGNOSIS — R109 Unspecified abdominal pain: Secondary | ICD-10-CM | POA: Insufficient documentation

## 2013-04-26 MED ORDER — TECHNETIUM TC 99M MEBROFENIN IV KIT
5.5000 | PACK | Freq: Once | INTRAVENOUS | Status: AC | PRN
  Administered 2013-04-26: 5.5 via INTRAVENOUS

## 2013-12-05 ENCOUNTER — Ambulatory Visit (INDEPENDENT_AMBULATORY_CARE_PROVIDER_SITE_OTHER): Payer: BC Managed Care – PPO | Admitting: Family Medicine

## 2013-12-05 VITALS — BP 152/86 | HR 66 | Temp 97.4°F | Resp 16 | Ht 65.0 in | Wt 191.0 lb

## 2013-12-05 DIAGNOSIS — Z13220 Encounter for screening for lipoid disorders: Secondary | ICD-10-CM

## 2013-12-05 DIAGNOSIS — I10 Essential (primary) hypertension: Secondary | ICD-10-CM

## 2013-12-05 DIAGNOSIS — M1990 Unspecified osteoarthritis, unspecified site: Secondary | ICD-10-CM

## 2013-12-05 DIAGNOSIS — M129 Arthropathy, unspecified: Secondary | ICD-10-CM

## 2013-12-05 MED ORDER — OLMESARTAN MEDOXOMIL 20 MG PO TABS: 20.0000 mg | ORAL_TABLET | Freq: Every day | ORAL | Status: DC

## 2013-12-05 MED ORDER — CELECOXIB 100 MG PO CAPS: 100.0000 mg | ORAL_CAPSULE | Freq: Two times a day (BID) | ORAL | Status: DC

## 2013-12-05 NOTE — Patient Instructions (Addendum)
Go back on your benicar.  I will be in touch with your labs.  Please come and see me in about 2 months for a recheck For your arthritis, I would recommend tylenol as your safest choice.  You can also try some celebrex as needed for your pain, but remember this may irritate your stomach or raise your blood pressure  As for GYN, I might recommend that you see someone at [facility] off of [address]

## 2013-12-05 NOTE — Progress Notes (Signed)
[facility] [address], [place] [address] [phone]  Date:  12/05/2013   Name:  Maria [name]   DOB:  10/08/1950   MRN:  [id number]  PCP:  Lamar Blinks, MD    Chief Complaint: Arthritis and Hypertension   History of Present Illness:  Maria [name] is a 63 y.o. very pleasant female patient who presents with the following:  Here today to discuss chronic issues as above.  Last seen by myself over a year ago, she did see Dr, Benson Norway and had several colon polyps removed.  She also has a hiatal hernia, and was having a lot of GERD but is now doing better with prilosec.  She does not have any ulcer history as far as she knows.   She also saw GYN for some tiny fibroid cysts.  She had seen Dr. Charlesetta Garibaldi, but might like to go somewhere else.  She liked Dr. Charlesetta Garibaldi but had a lot of pain during her most recent procedure there.    She has been out of her BP medication for a couple of months- had been on benicar and tolerated it well  She does have complaint of some arthritis.  She had some bone spurs removed from her neck a few years ago by Dr. Lynann Bologna.  However right now her main issue is her hands and knees.  She is not taking anything for this right now. She had used celebrex in the past which worked well for her.    She is fasting today and would like to do labs.   Patient Active Problem List   Diagnosis Date Noted  . HEARTBURN 09/19/2009  . NONSPECIFIC ABN FINDING RAD & OTH EXAM GI TRACT 09/19/2009    Past Medical History  Diagnosis Date  . Arthritis   . Hypertension   . GERD (gastroesophageal reflux disease)   . Colon polyps   . Diverticulosis     Past Surgical History  Procedure Laterality Date  . Spine surgery      Neck    History  Substance Use Topics  . Smoking status: Never Smoker   . Smokeless tobacco: Not on file  . Alcohol Use: Not on file    Family History  Problem Relation Age of Onset  . Arthritis Brother     No Known  Allergies  Medication list has been reviewed and updated.  Current Outpatient Prescriptions on File Prior to Visit  Medication Sig Dispense Refill  . Multiple Vitamin (MULTIVITAMIN) tablet Take 1 tablet by mouth daily.      Marland Kitchen omeprazole (PRILOSEC) 20 MG capsule Take 20 mg by mouth daily.      Marland Kitchen olmesartan (BENICAR) 20 MG tablet Take 20 mg by mouth daily.       No current facility-administered medications on file prior to visit.    Review of Systems:  As per HPI- otherwise negative.   Physical Examination: Filed Vitals:   12/05/13 1153  BP: 152/86  Pulse: 66  Temp: 97.4 F (36.3 C)  Resp: 16   Filed Vitals:   12/05/13 1153  Height: 5\' 5"  (1.651 m)  Weight: 191 lb (86.637 kg)   Body mass index is 31.78 kg/(m^2). Ideal Body Weight: Weight in (lb) to have BMI = 25: 149.9  GEN: WDWN, NAD, Non-toxic, A & O x 3, obese, looks well HEENT: Atraumatic, Normocephalic. Neck supple. No masses, No LAD. Ears and Nose: No external deformity. CV: RRR, No M/G/R. No JVD. No thrill. No extra heart  sounds. PULM: CTA B, no wheezes, crackles, rhonchi. No retractions. No resp. distress. No accessory muscle use. EXTR: No c/c/e NEURO Normal gait.  PSYCH: Normally interactive. Conversant. Not depressed or anxious appearing.  Calm demeanor.  Hands: normal motion, no apparent nodules.  No ulnar deviation of fingers Knees: crepitus bilaterally but no heat or effusion, full ROM  Assessment and Plan: Essential hypertension - Plan: olmesartan (BENICAR) 20 MG tablet, CBC, Comprehensive metabolic panel  Arthritis - Plan: celecoxib (CELEBREX) 100 MG capsule  Screening for hyperlipidemia - Plan: Lipid panel  See patient instructions for more details.   She will cautiously give celebrex a try  Signed Lamar Blinks, MD

## 2013-12-06 ENCOUNTER — Encounter: Payer: Self-pay | Admitting: Family Medicine

## 2013-12-06 ENCOUNTER — Telehealth: Payer: Self-pay

## 2013-12-06 DIAGNOSIS — I10 Essential (primary) hypertension: Secondary | ICD-10-CM

## 2013-12-06 LAB — CBC
HEMATOCRIT: 39.1 % (ref 36.0–46.0)
Hemoglobin: 13 g/dL (ref 12.0–15.0)
MCH: 29.6 pg (ref 26.0–34.0)
MCHC: 33.2 g/dL (ref 30.0–36.0)
MCV: 89.1 fL (ref 78.0–100.0)
PLATELETS: 301 10*3/uL (ref 150–400)
RBC: 4.39 MIL/uL (ref 3.87–5.11)
RDW: 12.8 % (ref 11.5–15.5)
WBC: 7.6 10*3/uL (ref 4.0–10.5)

## 2013-12-06 LAB — COMPREHENSIVE METABOLIC PANEL
ALBUMIN: 4.3 g/dL (ref 3.5–5.2)
ALK PHOS: 99 U/L (ref 39–117)
ALT: 14 U/L (ref 0–35)
AST: 18 U/L (ref 0–37)
BUN: 16 mg/dL (ref 6–23)
CHLORIDE: 103 meq/L (ref 96–112)
CO2: 26 mEq/L (ref 19–32)
Calcium: 9.7 mg/dL (ref 8.4–10.5)
Creat: 0.81 mg/dL (ref 0.50–1.10)
Glucose, Bld: 90 mg/dL (ref 70–99)
POTASSIUM: 4.1 meq/L (ref 3.5–5.3)
SODIUM: 138 meq/L (ref 135–145)
Total Bilirubin: 0.3 mg/dL (ref 0.2–1.2)
Total Protein: 7.1 g/dL (ref 6.0–8.3)

## 2013-12-06 LAB — LIPID PANEL
Cholesterol: 210 mg/dL — ABNORMAL HIGH (ref 0–200)
HDL: 51 mg/dL (ref >39)
LDL CALC: 136 mg/dL — AB (ref 0–99)
Total CHOL/HDL Ratio: 4.1 Ratio
Triglycerides: 113 mg/dL (ref <150)
VLDL: 23 mg/dL (ref 0–40)

## 2013-12-06 NOTE — Telephone Encounter (Signed)
Pt is waiting for prior authorization from dr copland for benecar and celebrex.  bcbs #[id number]

## 2013-12-07 NOTE — Telephone Encounter (Signed)
Information for Prior Auth sent to Cover My Meds. Pending approval.

## 2013-12-07 NOTE — Telephone Encounter (Signed)
Pt called again to check on the status of the prior authorization for benecar and celebrex. Pt is very frustrated and said she doesn't think she should have to wait this long for a rx. Please call asap!

## 2013-12-09 ENCOUNTER — Telehealth: Payer: Self-pay

## 2013-12-09 NOTE — Telephone Encounter (Signed)
The patient called to request information regarding the prior authorization for her Celebrex and Benicar.  The patient states that she has called several times since Wednesday and still has not been able to get her medicine.  Please call the patient at [phone].

## 2013-12-10 ENCOUNTER — Telehealth: Payer: Self-pay

## 2013-12-10 NOTE — Telephone Encounter (Signed)
Pt advised benicar has been approved. The celebrex is still under review.

## 2013-12-10 NOTE — Telephone Encounter (Signed)
Patient is calling regarding  Lab results from visit with Dr Lorelei Pont on 12/05/13.

## 2013-12-10 NOTE — Telephone Encounter (Signed)
Benicar PA approval received this morning. 12/07/2013-12/10/2014- faxed to pharmacy The celecoxib is still pending for physician review- pt notified

## 2013-12-11 NOTE — Telephone Encounter (Signed)
Letter sent last week. Called and let her know.  Please check for this when my pts call. Our mail is very slow

## 2013-12-11 NOTE — Telephone Encounter (Signed)
Please advise on labs to report to pt.

## 2013-12-12 MED ORDER — LOSARTAN POTASSIUM 50 MG PO TABS: 50.0000 mg | ORAL_TABLET | Freq: Every day | ORAL | Status: DC

## 2013-12-12 NOTE — Telephone Encounter (Signed)
Pt notified- Her Benicar copay is $190. She is unable to pay this much for her bp medication. Can this be switched to a different medication that is available as a generic?    Celebrex has been approved 11/16/2013-12/12/2014

## 2013-12-12 NOTE — Telephone Encounter (Signed)
Pt notified of lab results

## 2013-12-12 NOTE — Telephone Encounter (Signed)
Changed to losartan and let her know

## 2014-08-29 ENCOUNTER — Encounter: Payer: Self-pay | Admitting: *Deleted

## 2014-11-20 ENCOUNTER — Encounter: Payer: Self-pay | Admitting: Family Medicine

## 2014-11-20 DIAGNOSIS — K2100 Gastro-esophageal reflux disease with esophagitis, without bleeding: Secondary | ICD-10-CM

## 2014-11-20 HISTORY — DX: Gastro-esophageal reflux disease with esophagitis, without bleeding: K21.00

## 2014-12-13 ENCOUNTER — Encounter (HOSPITAL_COMMUNITY)
Admission: RE | Disposition: A | Discharge status: Home / Self Care | Payer: Self-pay | Source: Ambulatory Visit | Attending: Gastroenterology

## 2014-12-13 ENCOUNTER — Ambulatory Visit (HOSPITAL_COMMUNITY)
Admission: RE | Admit: 2014-12-13 | Discharge: 2014-12-13 | Disposition: A | Discharge status: Home / Self Care | Payer: Medicare Other | Source: Ambulatory Visit | Attending: Gastroenterology | Admitting: Gastroenterology

## 2014-12-13 DIAGNOSIS — K209 Esophagitis, unspecified: Secondary | ICD-10-CM | POA: Insufficient documentation

## 2014-12-13 DIAGNOSIS — K449 Diaphragmatic hernia without obstruction or gangrene: Secondary | ICD-10-CM | POA: Insufficient documentation

## 2014-12-13 DIAGNOSIS — K219 Gastro-esophageal reflux disease without esophagitis: Secondary | ICD-10-CM | POA: Diagnosis present

## 2014-12-13 HISTORY — PX: ESOPHAGEAL MANOMETRY: SHX5429

## 2014-12-13 SURGERY — MANOMETRY, ESOPHAGUS

## 2014-12-13 MED ORDER — LIDOCAINE VISCOUS 2 % MT SOLN
OROMUCOSAL | Status: AC
  Filled 2014-12-13: qty 15

## 2014-12-13 SURGICAL SUPPLY — 2 items
FACESHIELD LNG OPTICON STERILE (SAFETY) IMPLANT
GLOVE BIO SURGEON STRL SZ8 (GLOVE) ×6 IMPLANT

## 2014-12-13 NOTE — Consult Note (Signed)
  Rolla Etienne HPI: This is a 64 year old female with persistent esophagitis.  Her repeat EGD on 11/19/2014 revealed an LA Grade D esophagitis and a 3 cm hiatal hernia.  She is here for manometry in hopes of pursuing a Nissen fundoplication.  Past Medical History  Diagnosis Date  . Arthritis   . Hypertension   . GERD (gastroesophageal reflux disease)   . Colon polyps   . Diverticulosis     Past Surgical History  Procedure Laterality Date  . Spine surgery      Neck    Family History  Problem Relation Age of Onset  . Arthritis Brother     Social History:  reports that she has never smoked. She does not have any smokeless tobacco history on file. Her alcohol and drug histories are not on file.  Allergies: No Known Allergies  Medications: Scheduled: Continuous:  No results found for this or any previous visit (from the past 24 hour(s)).   No results found.  ROS:  As stated above in the HPI otherwise negative.  Height 5\' 5"  (1.651 m), weight 86.637 kg (191 lb).    PE: Nursing procedure.  Assessment/Plan: 1) LA Grade D esophagitis - Manometry today. 2) Hiatal Hernia.   HUNG,PATRICK D 12/13/2014, 2:05 PM

## 2014-12-17 ENCOUNTER — Encounter (HOSPITAL_COMMUNITY): Payer: Self-pay | Admitting: Gastroenterology

## 2015-02-05 ENCOUNTER — Encounter: Payer: Self-pay | Admitting: Family Medicine

## 2015-02-05 ENCOUNTER — Ambulatory Visit (INDEPENDENT_AMBULATORY_CARE_PROVIDER_SITE_OTHER): Payer: Medicare Other | Admitting: Family Medicine

## 2015-02-05 VITALS — BP 131/79 | HR 73 | Temp 97.7°F | Resp 17 | Ht 65.5 in | Wt 195.0 lb

## 2015-02-05 DIAGNOSIS — Z0000 Encounter for general adult medical examination without abnormal findings: Secondary | ICD-10-CM

## 2015-02-05 DIAGNOSIS — Z131 Encounter for screening for diabetes mellitus: Secondary | ICD-10-CM

## 2015-02-05 DIAGNOSIS — R9431 Abnormal electrocardiogram [ECG] [EKG]: Secondary | ICD-10-CM | POA: Diagnosis not present

## 2015-02-05 DIAGNOSIS — Z130 Encounter for screening for diseases of the blood and blood-forming organs and certain disorders involving the immune mechanism: Secondary | ICD-10-CM | POA: Diagnosis not present

## 2015-02-05 DIAGNOSIS — Z119 Encounter for screening for infectious and parasitic diseases, unspecified: Secondary | ICD-10-CM | POA: Diagnosis not present

## 2015-02-05 DIAGNOSIS — M1990 Unspecified osteoarthritis, unspecified site: Secondary | ICD-10-CM

## 2015-02-05 DIAGNOSIS — S91301A Unspecified open wound, right foot, initial encounter: Secondary | ICD-10-CM

## 2015-02-05 DIAGNOSIS — Z23 Encounter for immunization: Secondary | ICD-10-CM

## 2015-02-05 LAB — POC MICROSCOPIC URINALYSIS (UMFC): Mucus: ABSENT

## 2015-02-05 LAB — COMPREHENSIVE METABOLIC PANEL
ALBUMIN: 4 g/dL (ref 3.6–5.1)
ALT: 21 U/L (ref 6–29)
AST: 21 U/L (ref 10–35)
Alkaline Phosphatase: 86 U/L (ref 33–130)
BILIRUBIN TOTAL: 0.4 mg/dL (ref 0.2–1.2)
BUN: 13 mg/dL (ref 7–25)
CO2: 24 mmol/L (ref 20–31)
Calcium: 9.3 mg/dL (ref 8.6–10.4)
Chloride: 103 mmol/L (ref 98–110)
Creat: 0.88 mg/dL (ref 0.50–0.99)
GLUCOSE: 87 mg/dL (ref 65–99)
Potassium: 3.8 mmol/L (ref 3.5–5.3)
Sodium: 138 mmol/L (ref 135–146)
Total Protein: 6.8 g/dL (ref 6.1–8.1)

## 2015-02-05 LAB — CBC
HCT: 37.9 % (ref 36.0–46.0)
HEMOGLOBIN: 12.7 g/dL (ref 12.0–15.0)
MCH: 29.3 pg (ref 26.0–34.0)
MCHC: 33.5 g/dL (ref 30.0–36.0)
MCV: 87.5 fL (ref 78.0–100.0)
MPV: 10.9 fL (ref 8.6–12.4)
Platelets: 319 10*3/uL (ref 150–400)
RBC: 4.33 MIL/uL (ref 3.87–5.11)
RDW: 12.8 % (ref 11.5–15.5)
WBC: 10.2 10*3/uL (ref 4.0–10.5)

## 2015-02-05 LAB — POCT URINALYSIS DIP (MANUAL ENTRY)
Bilirubin, UA: NEGATIVE
Glucose, UA: NEGATIVE
Ketones, POC UA: NEGATIVE
LEUKOCYTES UA: NEGATIVE
NITRITE UA: NEGATIVE
PH UA: 5.5
Protein Ur, POC: NEGATIVE
Spec Grav, UA: 1.025
Urobilinogen, UA: 0.2

## 2015-02-05 LAB — LIPID PANEL
CHOLESTEROL: 198 mg/dL (ref 125–200)
HDL: 49 mg/dL (ref >=46)
LDL CALC: 130 mg/dL — AB (ref <130)
TRIGLYCERIDES: 95 mg/dL (ref <150)
Total CHOL/HDL Ratio: 4 Ratio (ref <=5.0)
VLDL: 19 mg/dL (ref <30)

## 2015-02-05 MED ORDER — CELECOXIB 100 MG PO CAPS: 100.0000 mg | ORAL_CAPSULE | Freq: Two times a day (BID) | ORAL | Status: DC

## 2015-02-05 NOTE — Progress Notes (Signed)
[facility] [address] [place] [address] [phone]  Date:  02/05/2015   Name:  Maria [name]   DOB:  1950/10/25   MRN:  [id number]  PCP:  Lamar Blinks, MD    Chief Complaint: Annual Exam   History of Present Illness:  Maria [name] is a 64 y.o. very pleasant female patient who presents with the following:  Here today for a CPE.  History of HTN, GERD She is fasting today for labs.  Colonoscopy this year Mammogram about 3 years ago Bone density: several years ago, she was told that she had osteopenia She has NOT had a hysterectomy- she sees her OBG next week.  Never had a pneumonia vaccine, due for a tetanus shot.    She has been off her HTN medication for about 6 months  She reports being active by doing a lot of walking.  She never has any CP with exertion or otherwise   Patient Active Problem List   Diagnosis Date Noted  . Reflux esophagitis 11/20/2014  . HEARTBURN 09/19/2009  . NONSPECIFIC ABN FINDING RAD & OTH EXAM GI TRACT 09/19/2009    Past Medical History  Diagnosis Date  . Arthritis   . Hypertension   . GERD (gastroesophageal reflux disease)   . Colon polyps   . Diverticulosis     Past Surgical History  Procedure Laterality Date  . Spine surgery      Neck  . Esophageal manometry N/A 12/13/2014    Procedure: ESOPHAGEAL MANOMETRY (EM);  Surgeon: Carol Ada, MD;  Location: [facility];  Service: Endoscopy;  Laterality: N/A;    Social History  Substance Use Topics  . Smoking status: Never Smoker   . Smokeless tobacco: None  . Alcohol Use: No    Family History  Problem Relation Age of Onset  . Arthritis Brother     No Known Allergies  Medication list has been reviewed and updated.  Current Outpatient Prescriptions on File Prior to Visit  Medication Sig Dispense Refill  . Multiple Vitamin (MULTIVITAMIN) tablet Take 1 tablet by mouth daily.    Marland Kitchen omeprazole (PRILOSEC) 20 MG capsule Take 20 mg by mouth daily.     . celecoxib (CELEBREX) 100 MG capsule Take 1 capsule (100 mg total) by mouth 2 (two) times daily. Use as needed for arthritis pain (Patient not taking: Reported on 02/05/2015) 60 capsule 3  . losartan (COZAAR) 50 MG tablet Take 1 tablet (50 mg total) by mouth daily. (Patient not taking: Reported on 02/05/2015) 90 tablet 3   No current facility-administered medications on file prior to visit.    Review of Systems:  As per HPI- otherwise negative.   Physical Examination: Filed Vitals:   02/05/15 1326  BP: 131/79  Pulse: 73  Temp: 97.7 F (36.5 C)  Resp: 17   Filed Vitals:   02/05/15 1326  Height: 5' 5.5" (1.664 m)  Weight: 195 lb (88.451 kg)   Body mass index is 31.94 kg/(m^2). Ideal Body Weight: Weight in (lb) to have BMI = 25: 152.2  GEN: WDWN, NAD, Non-toxic, A & O x 3, obese, looks well HEENT: Atraumatic, Normocephalic. Neck supple. No masses, No LAD.  Bilateral TM wnl, oropharynx normal.  PEERL,EOMI.   Ears and Nose: No external deformity. CV: RRR, No M/G/R. No JVD. No thrill. No extra heart sounds. PULM: CTA B, no wheezes, crackles, rhonchi. No retractions. No resp. distress. No accessory muscle use. ABD: S, NT, ND, +BS. No rebound. No  HSM. EXTR: No c/c/e NEURO Normal gait.  PSYCH: Normally interactive. Conversant. Not depressed or anxious appearing.  Calm demeanor.  She has complaint of pain in her right foot. The right foot shows fat pad atroph.  There is also cracked skin over the 1st MCP Crepitus bilateral knees, she does not have quite full flexion of the left shoulder  EKG: sinus rhythm with LVH and flipped t waves  Results for orders placed or performed in visit on 02/05/15  POCT urinalysis dipstick  Result Value Ref Range   Color, UA yellow yellow   Clarity, UA clear clear   Glucose, UA negative negative   Bilirubin, UA negative negative   Ketones, POC UA negative negative   Spec Grav, UA 1.025    Blood, UA small (A) negative   pH, UA 5.5    Protein  Ur, POC negative negative   Urobilinogen, UA 0.2    Nitrite, UA Negative Negative   Leukocytes, UA Negative Negative  POCT Microscopic Urinalysis ([facility])  Result Value Ref Range   WBC,UR,HPF,POC None None WBC/hpf   RBC,UR,HPF,POC None None RBC/hpf   Bacteria None None, Too numerous to count   Mucus Absent Absent   Epithelial Cells, UR Per Microscopy Few (A) None, Too numerous to count cells/hpf    Assessment and Plan: Welcome to Medicare preventive visit - Plan: Lipid panel, EKG 12-Lead, Pneumococcal conjugate vaccine 13-valent IM, POCT urinalysis dipstick, POCT Microscopic Urinalysis ([facility])  Open wound of right foot, initial encounter - Plan: Td vaccine greater than or equal to 7yo preservative free IM  Screening for diabetes mellitus - Plan: Comprehensive metabolic panel  Screening for deficiency anemia - Plan: CBC  Arthritis - Plan: celecoxib (CELEBREX) 100 MG capsule  Screening examination for infectious disease - Plan: Hepatitis C antibody  Abnormal EKG - Plan: Ambulatory referral to Cardiology  Here today for a CPE EKG shows non- specific abnl. She would like to see cardiology to ensure all is well and I will refer her  Labs pending as above celebrex for her various MSK pains= mostly due to OA.  Encouraged her to continue being active  Went over advance directives packet with her  Signed Lamar Blinks, MD

## 2015-02-05 NOTE — Patient Instructions (Signed)
It was great to see you today!  I will be in touch with your labs asap Please ask your OB-GYN about your mammogram and pap smear- if they could send [place] these records we would be very appreciative Try the celebrex as needed for your arthritis pain It is also ok to use tylenol as needed for pain according to the package directions Take a look at your living will/ power of attorney packet and discuss with your family/ children  Continue to exercise and keep an eye on your blood pressure Please see me in one year- sooner if you need anything   Try some soft padded insoles in your shoes

## 2015-02-06 ENCOUNTER — Encounter: Payer: Self-pay | Admitting: Family Medicine

## 2015-02-06 LAB — HEPATITIS C ANTIBODY: HCV AB: NEGATIVE

## 2015-02-14 ENCOUNTER — Emergency Department (HOSPITAL_COMMUNITY)
Admission: EM | Admit: 2015-02-14 | Discharge: 2015-02-14 | Disposition: A | Discharge status: Home / Self Care | Payer: Medicare Other | Attending: Emergency Medicine | Admitting: Emergency Medicine

## 2015-02-14 ENCOUNTER — Encounter (HOSPITAL_COMMUNITY): Payer: Self-pay | Admitting: Emergency Medicine

## 2015-02-14 DIAGNOSIS — Z791 Long term (current) use of non-steroidal anti-inflammatories (NSAID): Secondary | ICD-10-CM | POA: Insufficient documentation

## 2015-02-14 DIAGNOSIS — I10 Essential (primary) hypertension: Secondary | ICD-10-CM | POA: Diagnosis not present

## 2015-02-14 DIAGNOSIS — Z86010 Personal history of colonic polyps: Secondary | ICD-10-CM | POA: Insufficient documentation

## 2015-02-14 DIAGNOSIS — K219 Gastro-esophageal reflux disease without esophagitis: Secondary | ICD-10-CM | POA: Insufficient documentation

## 2015-02-14 DIAGNOSIS — M25551 Pain in right hip: Secondary | ICD-10-CM | POA: Diagnosis present

## 2015-02-14 DIAGNOSIS — M1990 Unspecified osteoarthritis, unspecified site: Secondary | ICD-10-CM | POA: Diagnosis not present

## 2015-02-14 DIAGNOSIS — M5431 Sciatica, right side: Secondary | ICD-10-CM | POA: Diagnosis not present

## 2015-02-14 DIAGNOSIS — Z79899 Other long term (current) drug therapy: Secondary | ICD-10-CM | POA: Insufficient documentation

## 2015-02-14 MED ORDER — KETOROLAC TROMETHAMINE 60 MG/2ML IM SOLN
30.0000 mg | Freq: Once | INTRAMUSCULAR | Status: AC
Start: 2015-02-14 — End: 2015-02-14
  Administered 2015-02-14: 30 mg via INTRAMUSCULAR
  Filled 2015-02-14: qty 2

## 2015-02-14 MED ORDER — METHOCARBAMOL 500 MG PO TABS: 500.0000 mg | ORAL_TABLET | Freq: Two times a day (BID) | ORAL | Status: DC

## 2015-02-14 MED ORDER — PREDNISONE 20 MG PO TABS: 40.0000 mg | ORAL_TABLET | Freq: Every day | ORAL | Status: DC

## 2015-02-14 MED ORDER — TRAMADOL HCL 50 MG PO TABS: 50.0000 mg | ORAL_TABLET | Freq: Four times a day (QID) | ORAL | Status: DC | PRN

## 2015-02-14 NOTE — ED Provider Notes (Signed)
CSN: [id number]     Arrival date & time 02/14/15  1632 History  By signing my name below, I, Maria [name], attest that this documentation has been prepared under the direction and in the presence of Jeannett Senior, [employer] Electronically Signed: Soijett [name], ED Scribe. 02/14/2015. 6:09 PM.   Chief Complaint  Patient presents with  . Hip Pain      The history is provided by the patient. No language interpreter was used.    Maria [name] is a 64 y.o. female with a medical hx of arthritis and HTN, who presents to the Emergency Department complaining of moderate right hip pain x 3 days. She notes that she was moving a large TV stand by herself prior to the onset of her symptoms. She denies falling at this time. She states that her right hip pain radiates to the front of her right thigh. She notes that she has had a pain similar to this in the past when she picked up a heavy baby and there was back pain resulting. Pt denies a PMHx of DM at this time. She notes that she has tried tylenol with no relief of her symptoms. She denies color change, wound, rash, joint swelling, numbness, weakness, gait problem, dysuria, fever, and any other symptoms.   PCP: Dr. Lamar Blinks   Past Medical History  Diagnosis Date  . Arthritis   . Hypertension   . GERD (gastroesophageal reflux disease)   . Colon polyps   . Diverticulosis    Past Surgical History  Procedure Laterality Date  . Spine surgery      Neck  . Esophageal manometry N/A 12/13/2014    Procedure: ESOPHAGEAL MANOMETRY (EM);  Surgeon: Carol Ada, MD;  Location: [facility];  Service: Endoscopy;  Laterality: N/A;   Family History  Problem Relation Age of Onset  . Arthritis Brother    Social History  Substance Use Topics  . Smoking status: Never Smoker   . Smokeless tobacco: None  . Alcohol Use: No   OB History    No data available     Review of Systems  Constitutional: Negative for fever.  Genitourinary: Negative for  dysuria.  Musculoskeletal: Positive for arthralgias. Negative for joint swelling and gait problem.  Skin: Negative for color change, rash and wound.  Neurological: Negative for weakness and numbness.      Allergies  Review of patient's allergies indicates no known allergies.  Home Medications   Prior to Admission medications   Medication Sig Start Date End Date Taking? Authorizing Provider  celecoxib (CELEBREX) 100 MG capsule Take 1 capsule (100 mg total) by mouth 2 (two) times daily. Use as needed for arthritis pain 02/05/15   [name], MD  Multiple Vitamin (MULTIVITAMIN) tablet Take 1 tablet by mouth daily.    Historical Provider, MD  omeprazole (PRILOSEC) 20 MG capsule Take 20 mg by mouth daily.    Historical Provider, MD   BP 137/84 mmHg  Pulse 75  Temp(Src) 98.1 F (36.7 C) (Oral)  Resp 16  SpO2 97% Physical Exam  Constitutional: She is oriented to person, place, and time. She appears well-developed and well-nourished. No distress.  HENT:  Head: Normocephalic and atraumatic.  Eyes: EOM are normal.  Neck: Neck supple.  Cardiovascular: Normal rate.   Pulmonary/Chest: Effort normal. No respiratory distress.  Musculoskeletal: Normal range of motion.  No midline lumbar spine tenderness. TTP over right SI joint. Pain with right straight leg raise.   Neurological: She is alert and  oriented to person, place, and time.  5/5 and equal lower extremity strength. 2+ and equal patellar reflexes bilaterally. Pt able to dorsiflex bilateral toes and feet with good strength against resistance. Equal sensation bilaterally over thighs and lower legs.   Skin: Skin is warm and dry.  Psychiatric: She has a normal mood and affect. Her behavior is normal.  Nursing note and vitals reviewed.   ED Course  Procedures (including critical care time) DIAGNOSTIC STUDIES: Oxygen Saturation is 97% on RA, nl by my interpretation.    COORDINATION OF CARE: 6:08 PM Discussed treatment plan  with pt at bedside which includes toradol injection, pain medication Rx, muscle relaxer Rx, prednisone Rx and pt agreed to plan.    Labs Review Labs Reviewed - No data to display  Imaging Review No results found.    EKG Interpretation None      MDM   Final diagnoses:  Sciatica of right side    Patient with right lower back pain radiating into the right anterior thigh after lifting a heavy piece of furniture. She is neurovascularly intact. No reflux to suggest cauda equina. I do not think she needs any imaging at this time. Denies any falls or any other trauma. Home with Robaxin, prednisone, tramadol. Most likely lumbar radiculopathy versus sciatica. Return precautions discussed.  Filed Vitals:   02/14/15 1701  BP: 137/84  Pulse: 75  Temp: 98.1 F (36.7 C)  TempSrc: Oral  Resp: 16  SpO2: 97%    I personally performed the services described in this documentation, which was scribed in my presence. The recorded information has been reviewed and is accurate.   Jeannett Senior, PA-C 02/14/15 1936  Carmin Muskrat, MD 02/15/15 1210

## 2015-02-14 NOTE — ED Notes (Signed)
Pt comes to ED with c/o of right hip pain , and surging pain  Right thigh pain. Pt was moving furniture and noticed on going pain for last 2 days, having trouble moving around in pain,  Pain score 7 out 10, and can not make quick movements/ range of motion limited.

## 2015-02-14 NOTE — ED Notes (Signed)
AVS explained in detail. Knows to take all medications as prescribed and not to drink ETOH/drive/operate heavy machinery while taking medications. Knows to follow up with PCP if pain persists past steroid regimen. Ambulatory with slow gait. Refused wheelchair. Contacted registration and were told to expect patient so security can transport her to her car (pt says, "My car is all the way at the other end of the parking lot."). No other c/c.

## 2015-02-14 NOTE — Discharge Instructions (Signed)
Take tylenol or motrin for pain. Take tramadol for severe pain. Robaxin for spasms. Take prednisone as prescribed daily until all gone. Follow up with your doctor next week. Return if any weakness in leg. Any trouble urinating. Loss of bowels control. Any new concerning symptom.   Lumbosacral Radiculopathy Lumbosacral radiculopathy is a condition that involves the spinal nerves and nerve roots in the low back and bottom of the spine. The condition develops when these nerves and nerve roots move out of place or become inflamed and cause symptoms. CAUSES This condition may be caused by:  Pressure from a disk that bulges out of place (herniated disk). A disk is a plate of cartilage that separates bones in the spine.  Disk degeneration.  A narrowing of the bones of the lower back (spinal stenosis).  A tumor.  An infection.  An injury that places sudden pressure on the disks that cushion the bones of your lower spine. RISK FACTORS This condition is more likely to develop in:  Males aged 30-50 years.  Females aged 65-60 years.  People who lift improperly.  People who are overweight or live a sedentary lifestyle.  People who smoke.  People who perform repetitive activities that strain the spine. SYMPTOMS Symptoms of this condition include:  Pain that goes down from the back into the legs (sciatica). This is the most common symptom. The pain may be worse with sitting, coughing, or sneezing.  Pain and numbness in the arms and legs.  Muscle weakness.  Tingling.  Loss of bladder control or bowel control. DIAGNOSIS This condition is diagnosed with a physical exam and medical history. If the pain is lasting, you may have tests, such as:  MRI scan.  X-ray.  CT scan.  Myelogram.  Nerve conduction study. TREATMENT This condition is often treated with:  Hot packs and ice applied to affected areas.  Stretches to improve flexibility.  Exercises to strengthen back  muscles.  Physical therapy.  Pain medicine.  A steroid injection in the spine. In some cases, no treatment is needed. If the condition is long-lasting (chronic), or if symptoms are severe, treatment may involve surgery or lifestyle changes, such as following a weight loss plan. HOME CARE INSTRUCTIONS Medicines  Take medicines only as directed by your health care provider.  Do not drive or operate heavy machinery while taking pain medicine. Injury Care  Apply a heat pack to the injured area as directed by your health care provider.  Apply ice to the affected area:  Put ice in a plastic bag.  Place a towel between your skin and the bag.  Leave the ice on for 20-30 minutes, every 2 hours while you are awake or as needed. Or, leave the ice on for as long as directed by your health care provider. Other Instructions  If you were shown how to do any exercises or stretches, do them as directed by your health care provider.  If your health care provider prescribed a diet or exercise program, follow it as directed.  Keep all follow-up visits as directed by your health care provider. This is important. SEEK MEDICAL CARE IF:  Your pain does not improve over time even when taking pain medicines. SEEK IMMEDIATE MEDICAL CARE IF:  Your develop severe pain.  Your pain suddenly gets worse.  You develop increasing weakness in your legs.  You lose the ability to control your bladder or bowel.  You have difficulty walking or balancing.  You have a fever.   This  information is not intended to replace advice given to you by your health care provider. Make sure you discuss any questions you have with your health care provider.   Document Released: 03/08/2005 Document Revised: 07/23/2014 Document Reviewed: 03/04/2014 Elsevier Interactive Patient Education [employer].

## 2015-04-11 ENCOUNTER — Encounter: Payer: Self-pay | Admitting: Family Medicine

## 2015-04-14 ENCOUNTER — Ambulatory Visit (INDEPENDENT_AMBULATORY_CARE_PROVIDER_SITE_OTHER): Payer: Medicare Other | Admitting: Internal Medicine

## 2015-04-14 VITALS — BP 142/88 | HR 67 | Ht 65.5 in | Wt 196.8 lb

## 2015-04-14 DIAGNOSIS — R9431 Abnormal electrocardiogram [ECG] [EKG]: Secondary | ICD-10-CM

## 2015-04-14 NOTE — Progress Notes (Signed)
Cardiology Office Note   Date:  04/14/2015   ID:  ALENI BENTSEN, DOB 01/06/1951, MRN [id number]  PCP:  Lamar Blinks, MD  Cardiologist:   Dorris Carnes, MD   Pt referred by J Copland for abnormal EKG     History of Present Illness: Maria [name] is a 65 y.o. female with a history of Pt denies SOB  Dneis CP   Occasionally feels like heart may slows  No dizziness   Works  Exercise  Goes to [employer]  Uses treadmill Steps    Current Outpatient Prescriptions  Medication Sig Dispense Refill  . celecoxib (CELEBREX) 100 MG capsule Take 1 capsule (100 mg total) by mouth 2 (two) times daily. Use as needed for arthritis pain 60 capsule 3  . Multiple Vitamin (MULTIVITAMIN) tablet Take 1 tablet by mouth daily.    Marland Kitchen omeprazole (PRILOSEC) 20 MG capsule Take 20 mg by mouth daily.     No current facility-administered medications for this visit.    Allergies:   Review of patient's allergies indicates no known allergies.   Past Medical History  Diagnosis Date  . Arthritis   . Hypertension   . GERD (gastroesophageal reflux disease)   . Colon polyps   . Diverticulosis     Past Surgical History  Procedure Laterality Date  . Spine surgery      Neck  . Esophageal manometry N/A 12/13/2014    Procedure: ESOPHAGEAL MANOMETRY (EM);  Surgeon: Carol Ada, MD;  Location: [facility];  Service: Endoscopy;  Laterality: N/A;     Social History:  The patient  reports that she has never smoked. She does not have any smokeless tobacco history on file. She reports that she does not drink alcohol or use illicit drugs.   Family History:  The patient's family history includes Arthritis in her brother.    ROS:  Please see the history of present illness. All other systems are reviewed and  Negative to the above problem except as noted.    PHYSICAL EXAM: VS:  BP 142/88 mmHg  Pulse 67  Ht 5' 5.5" (1.664 m)  Wt 89.268 kg (196 lb 12.8 oz)  BMI 32.24 kg/m2  SpO2 99%  GEN: Well nourished,  well developed, in no acute distress HEENT: normal Neck: no JVD, carotid bruits, or masses Cardiac: RRR; no murmurs, rubs, or gallops,no edema  Respiratory:  clear to auscultation bilaterally, normal work of breathing GI: soft, nontender, nondistended, + BS  No hepatomegaly  MS: no deformity Moving all extremities   Skin: warm and dry, no rash Neuro:  Strength and sensation are intact Psych: euthymic mood, full affect   EKG:  EKG is ordered today.   Lipid Panel    Component Value Date/Time   CHOL 198 02/05/2015 1416   TRIG 95 02/05/2015 1416   HDL 49 02/05/2015 1416   CHOLHDL 4.0 02/05/2015 1416   VLDL 19 02/05/2015 1416   LDLCALC 130* 02/05/2015 1416      Wt Readings from Last 3 Encounters:  04/14/15 89.268 kg (196 lb 12.8 oz)  02/05/15 88.451 kg (195 lb)  12/13/14 86.637 kg (191 lb)      ASSESSMENT AND PLAN:  1.  Abnormal EKG She has subltle changes on EKG several years ago   Will get echo to evaluate LV chamber thickness, LV function  2.  Blood pressure  Pt's diastolic BP is a little hight  WIll need to be followed  She may need Rx with  Antihypertensive  3.  HL  Discussed lipids   LDL 130  Goal 100  Watch saturated fats  Pt has questions re use of golden seal and echinacea  WIll reivew    Signed, Dorris Carnes, MD  04/14/2015 10:14 AM    [facility] [place], [place], [place]  [address] Phone: [phone]; Fax: [phone]

## 2015-04-14 NOTE — Patient Instructions (Signed)
Your physician recommends that you continue on your current medications as directed. Please refer to the Current Medication list given to you today.  Your physician has requested that you have an echocardiogram. Echocardiography is a painless test that uses sound waves to create images of your heart. It provides your doctor with information about the size and shape of your heart and how well your heart's chambers and valves are working. This procedure takes approximately one hour. There are no restrictions for this procedure.  Your physician recommends that you schedule a follow-up appointment as needed with Dr. Ross.   

## 2015-04-16 ENCOUNTER — Encounter: Payer: Self-pay | Admitting: Family Medicine

## 2015-04-16 ENCOUNTER — Telehealth: Payer: Self-pay | Admitting: Internal Medicine

## 2015-04-16 NOTE — Telephone Encounter (Signed)
Referring to Maria [name] for suggestions.

## 2015-04-16 NOTE — Telephone Encounter (Signed)
New Messasge  Pt asking what type of vitamins/supplements are useful in strengthening the heart. Please call back and discuss.

## 2015-04-16 NOTE — Telephone Encounter (Signed)
Spoke with pt.  Pt was asking if there were any specific vitamins or supplements she should take for heart health.  I explained there are none that we would recommend on a routine basis other than just a normal multivitamin.  We discussed some specific agents she had questions about including echinacea and goldenseal.  I told her neither of these have strong data to support use for heart health at this time.

## 2015-04-24 ENCOUNTER — Ambulatory Visit (HOSPITAL_COMMUNITY): Payer: Medicare Other | Attending: Internal Medicine

## 2015-04-24 ENCOUNTER — Other Ambulatory Visit: Payer: Self-pay

## 2015-04-24 DIAGNOSIS — E785 Hyperlipidemia, unspecified: Secondary | ICD-10-CM | POA: Insufficient documentation

## 2015-04-24 DIAGNOSIS — R9431 Abnormal electrocardiogram [ECG] [EKG]: Secondary | ICD-10-CM | POA: Diagnosis not present

## 2015-04-24 DIAGNOSIS — I517 Cardiomegaly: Secondary | ICD-10-CM | POA: Insufficient documentation

## 2015-04-24 DIAGNOSIS — I10 Essential (primary) hypertension: Secondary | ICD-10-CM | POA: Insufficient documentation

## 2015-05-29 ENCOUNTER — Telehealth: Payer: Self-pay

## 2015-05-29 NOTE — Telephone Encounter (Signed)
[facility] note received via fax  Call:   Date: 05/24/15 Time: 12:09pm   Caller:  Patient Return number:  [phone]  Nurse:  Starleen Blue, RN  Chief Complaint: Muscle Jerks, Tics and Shudders  Reason for call:  Caller states she has hand and leg cramps  Related visit to physician within the last 2 weeks:  No    Disposition:  Final attempt made--message left

## 2015-05-29 NOTE — Telephone Encounter (Signed)
Called to follow up with patient.  Left message for call back.

## 2015-05-30 NOTE — Telephone Encounter (Signed)
I called her back on 3/10.  She stated that she was having "real bad leg cramps."  However she had been doing a 6 day fast with her church.  Now that she is eating again she is ok.  Advised her that this fast may be excessive for her and I would not recommend that she do this again

## 2015-06-05 ENCOUNTER — Encounter: Payer: Medicare Other | Admitting: Pharmacist

## 2015-06-05 NOTE — Progress Notes (Signed)
   Cardiology Office Note   Date:  06/05/2015   ID:  Maria [name], DOB [birth year], MRN [id number]  PCP:  Lamar Blinks, MD  Cardiologist:   Dorris Carnes, MD    History of Present Illness: Maria [name] is a 65 y.o. female patient of Dr. Harrington Challenger with a PMH significant for HTN.  She was first seen by Dr. Harrington Challenger in January after there were some EKG abnormalities seen by her PCP.  Dr. Harrington Challenger suggested she have an echo done to evaluate LV function. Echo was done on 04/24/15.  EF normal but some LV hypertrophy.  Dr. Harrington Challenger thought this may explain the EKG abnormalities and suggested close follow up of her BP.  Dr. Harrington Challenger suggested she keep track of her BP over 1 month and made the appt for her to follow up today.   She has taken Benicar in the past but stopped because she felt fine.   Current BP meds: none  Intolerances: none   BP Readings from Last 3 Encounters:  04/14/15 142/88  02/14/15 137/84  02/05/15 131/79     Current Outpatient Prescriptions  Medication Sig Dispense Refill  . celecoxib (CELEBREX) 100 MG capsule Take 1 capsule (100 mg total) by mouth 2 (two) times daily. Use as needed for arthritis pain 60 capsule 3  . Multiple Vitamin (MULTIVITAMIN) tablet Take 1 tablet by mouth daily.    Marland Kitchen omeprazole (PRILOSEC) 20 MG capsule Take 20 mg by mouth daily.     No current facility-administered medications for this visit.    Allergies:   Review of patient's allergies indicates no known allergies.   Past Medical History  Diagnosis Date  . Arthritis   . Hypertension   . GERD (gastroesophageal reflux disease)   . Colon polyps   . Diverticulosis     Past Surgical History  Procedure Laterality Date  . Spine surgery      Neck  . Esophageal manometry N/A 12/13/2014    Procedure: ESOPHAGEAL MANOMETRY (EM);  Surgeon: Carol Ada, MD;  Location: [facility];  Service: Endoscopy;  Laterality: N/A;     Social History:  The patient  reports that she has never smoked. She does not have any  smokeless tobacco history on file. She reports that she does not drink alcohol or use illicit drugs.   Family History:  The patient's family history includes Arthritis in her brother.     Wt Readings from Last 3 Encounters:  04/14/15 196 lb 12.8 oz (89.268 kg)  02/05/15 195 lb (88.451 kg)  12/13/14 191 lb (86.637 kg)      ASSESSMENT AND PLAN:  1. Hypertension  Weston Brass Aris [place] [facility]  06/05/2015 10:14 AM    [facility] [place], [place], [place]  [address] Phone: [phone]; Fax: [phone]     This encounter was created in error - please disregard.

## 2015-09-15 ENCOUNTER — Encounter (INDEPENDENT_AMBULATORY_CARE_PROVIDER_SITE_OTHER): Payer: Self-pay

## 2015-09-15 ENCOUNTER — Ambulatory Visit (INDEPENDENT_AMBULATORY_CARE_PROVIDER_SITE_OTHER): Payer: Medicare Other | Admitting: Family Medicine

## 2015-09-15 ENCOUNTER — Encounter: Payer: Self-pay | Admitting: Family Medicine

## 2015-09-15 VITALS — BP 149/77 | HR 65 | Temp 98.1°F | Ht 66.0 in | Wt 195.2 lb

## 2015-09-15 DIAGNOSIS — R030 Elevated blood-pressure reading, without diagnosis of hypertension: Secondary | ICD-10-CM | POA: Diagnosis not present

## 2015-09-15 DIAGNOSIS — IMO0001 Reserved for inherently not codable concepts without codable children: Secondary | ICD-10-CM

## 2015-09-15 DIAGNOSIS — R062 Wheezing: Secondary | ICD-10-CM | POA: Diagnosis not present

## 2015-09-15 MED ORDER — MONTELUKAST SODIUM 10 MG PO TABS: 10.0000 mg | ORAL_TABLET | Freq: Every day | ORAL | Status: DC

## 2015-09-15 MED ORDER — ALBUTEROL SULFATE HFA 108 (90 BASE) MCG/ACT IN AERS: 2.0000 | INHALATION_SPRAY | Freq: Four times a day (QID) | RESPIRATORY_TRACT | Status: DC | PRN
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
## 2015-09-15 NOTE — Progress Notes (Signed)
Pre visit review using our clinic review tool, if applicable. No additional management support is needed unless otherwise documented below in the visit note. 
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
## 2015-10-13 ENCOUNTER — Ambulatory Visit (INDEPENDENT_AMBULATORY_CARE_PROVIDER_SITE_OTHER): Payer: Medicare Other | Admitting: Physician Assistant

## 2015-10-13 VITALS — BP 124/80 | HR 68 | Temp 98.0°F | Resp 17 | Ht 65.5 in | Wt 194.0 lb

## 2015-10-13 DIAGNOSIS — R350 Frequency of micturition: Secondary | ICD-10-CM

## 2015-10-13 DIAGNOSIS — R7303 Prediabetes: Secondary | ICD-10-CM | POA: Insufficient documentation

## 2015-10-13 DIAGNOSIS — N3090 Cystitis, unspecified without hematuria: Secondary | ICD-10-CM | POA: Diagnosis not present

## 2015-10-13 HISTORY — DX: Prediabetes: R73.03

## 2015-10-13 LAB — COMPREHENSIVE METABOLIC PANEL
ALBUMIN: 4.2 g/dL (ref 3.6–5.1)
ALK PHOS: 93 U/L (ref 33–130)
ALT: 17 U/L (ref 6–29)
AST: 18 U/L (ref 10–35)
BILIRUBIN TOTAL: 0.4 mg/dL (ref 0.2–1.2)
BUN: 13 mg/dL (ref 7–25)
CALCIUM: 9.7 mg/dL (ref 8.6–10.4)
CO2: 25 mmol/L (ref 20–31)
Chloride: 101 mmol/L (ref 98–110)
Creat: 0.82 mg/dL (ref 0.50–0.99)
Glucose, Bld: 106 mg/dL — ABNORMAL HIGH (ref 65–99)
POTASSIUM: 4.3 mmol/L (ref 3.5–5.3)
Sodium: 138 mmol/L (ref 135–146)
TOTAL PROTEIN: 6.9 g/dL (ref 6.1–8.1)

## 2015-10-13 LAB — POCT URINALYSIS DIP (MANUAL ENTRY)
BILIRUBIN UA: NEGATIVE
GLUCOSE UA: NEGATIVE
Ketones, POC UA: NEGATIVE
Leukocytes, UA: NEGATIVE
Nitrite, UA: NEGATIVE
Protein Ur, POC: NEGATIVE
SPEC GRAV UA: 1.015
UROBILINOGEN UA: 0.2
pH, UA: 5.5

## 2015-10-13 LAB — POCT GLYCOSYLATED HEMOGLOBIN (HGB A1C): HEMOGLOBIN A1C: 6.4

## 2015-10-13 LAB — POCT CBC
Granulocyte percent: 71.9 %G (ref 37–80)
HCT, POC: 39.7 % (ref 37.7–47.9)
Hemoglobin: 13.7 g/dL (ref 12.2–16.2)
Lymph, poc: 2.7 (ref 0.6–3.4)
MCH, POC: 30.5 pg (ref 27–31.2)
MCHC: 34.7 g/dL (ref 31.8–35.4)
MCV: 87.8 fL (ref 80–97)
MID (CBC): 0.4 (ref 0–0.9)
MPV: 9 fL (ref 0–99.8)
PLATELET COUNT, POC: 263 10*3/uL (ref 142–424)
POC Granulocyte: 7.7 — AB (ref 2–6.9)
POC LYMPH %: 24.8 % (ref 10–50)
POC MID %: 3.3 % (ref 0–12)
RBC: 4.52 M/uL (ref 4.04–5.48)
RDW, POC: 12.1 %
WBC: 10.7 10*3/uL — AB (ref 4.6–10.2)

## 2015-10-13 LAB — POC MICROSCOPIC URINALYSIS (UMFC): MUCUS RE: ABSENT

## 2015-10-13 LAB — GLUCOSE, POCT (MANUAL RESULT ENTRY): POC GLUCOSE: 107 mg/dL — AB (ref 70–99)

## 2015-10-13 MED ORDER — CIPROFLOXACIN HCL 500 MG PO TABS: 500.0000 mg | ORAL_TABLET | Freq: Two times a day (BID) | ORAL | 0 refills | Status: DC

## 2015-10-13 NOTE — Patient Instructions (Addendum)
You should LIMIT white foods (potatoes, rice, bread, pasta) and AVOID sweets, sugary beverages and fried foods. You should exercise regularly - at least 30 minutes at a time for most days of the week (at least 4).  Bernardo Heater is a good grain. All other grains should be whole grains.  Take cipro twice a day for 3 days.  I will call you with your lab results.  Follow up with Dr. Lorelei Pont or sooner if your symptoms worsen.    IF you received an x-ray today, you will receive an invoice from [facility]. Please contact [facility] at [phone] with questions or concerns regarding your invoice.   IF you received labwork today, you will receive an invoice from [employer]. Please contact [facility] at [phone] with questions or concerns regarding your invoice.   Our billing staff will not be able to assist you with questions regarding bills from these companies.  You will be contacted with the lab results as soon as they are available. The fastest way to get your results is to activate your My Chart account. Instructions are located on the last page of this paperwork. If you have not heard from [place] regarding the results in 2 weeks, please contact this office.     We recommend that you schedule a mammogram for breast cancer screening. Typically, you do not need a referral to do this. Please contact a local imaging center to schedule your mammogram.  [facility] - [phone]  *ask for the Radiology Department [facility] - [phone] or [phone]  [facility] - [phone] [place] [phone] [facility] - [phone]  *ask for the [facility] - [phone]  *ask for the Radiology Department [facility] - [phone]  *ask for the [facility] - [phone]
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
## 2015-10-15 LAB — URINE CULTURE: Organism ID, Bacteria: 10000

## 2015-10-20 ENCOUNTER — Telehealth: Payer: Self-pay | Admitting: Family Medicine

## 2015-10-20 ENCOUNTER — Encounter: Payer: Self-pay | Admitting: Family Medicine

## 2015-10-20 NOTE — Telephone Encounter (Signed)
Called her back- LMOM.  Will send her a mychart message so she can reply directly and let me know her concern.  I read note from [facility] last week

## 2015-10-20 NOTE — Telephone Encounter (Signed)
Pt called in to request a call back from PCP directly if possible. She says that provider informed her to call and leave a message for her whenever she need to speak with her.    [phone]

## 2015-10-29 ENCOUNTER — Encounter: Payer: Self-pay | Admitting: Family Medicine

## 2015-10-30 ENCOUNTER — Telehealth: Payer: Self-pay | Admitting: Emergency Medicine

## 2015-10-30 NOTE — Telephone Encounter (Signed)
Please contact Maria [name] and let her know that Dr. Edilia Bo is out of the office. I would recommend that she add trial of as needed Tylenol. If this does not help, she should schedule an appointment with Dr. Edilia Bo for further evaluation.

## 2015-10-30 NOTE — Telephone Encounter (Signed)
Dr. Lorelei Pont replied to pt's MyChart message and advised pt to schedule appt. Pt has viewed MyChart message.

## 2015-12-17 ENCOUNTER — Ambulatory Visit (HOSPITAL_BASED_OUTPATIENT_CLINIC_OR_DEPARTMENT_OTHER): Payer: Medicare Other

## 2015-12-17 ENCOUNTER — Ambulatory Visit (INDEPENDENT_AMBULATORY_CARE_PROVIDER_SITE_OTHER): Payer: Medicare Other | Admitting: Family Medicine

## 2015-12-17 VITALS — BP 137/90 | HR 95 | Temp 98.5°F | Ht 65.5 in | Wt 179.6 lb

## 2015-12-17 DIAGNOSIS — M25551 Pain in right hip: Secondary | ICD-10-CM

## 2015-12-17 DIAGNOSIS — M8580 Other specified disorders of bone density and structure, unspecified site: Secondary | ICD-10-CM

## 2015-12-17 DIAGNOSIS — Z23 Encounter for immunization: Secondary | ICD-10-CM | POA: Diagnosis not present

## 2015-12-17 DIAGNOSIS — M1990 Unspecified osteoarthritis, unspecified site: Secondary | ICD-10-CM

## 2015-12-17 DIAGNOSIS — D72829 Elevated white blood cell count, unspecified: Secondary | ICD-10-CM | POA: Diagnosis not present

## 2015-12-17 LAB — CBC
HEMATOCRIT: 39.2 % (ref 36.0–46.0)
HEMOGLOBIN: 13.3 g/dL (ref 12.0–15.0)
MCHC: 34 g/dL (ref 30.0–36.0)
MCV: 87.8 fl (ref 78.0–100.0)
PLATELETS: 253 10*3/uL (ref 150.0–400.0)
RBC: 4.47 Mil/uL (ref 3.87–5.11)
RDW: 12.4 % (ref 11.5–15.5)
WBC: 9.6 10*3/uL (ref 4.0–10.5)

## 2015-12-17 LAB — VITAMIN D 25 HYDROXY (VIT D DEFICIENCY, FRACTURES): VITD: 26.61 ng/mL — AB (ref 30.00–100.00)

## 2015-12-17 NOTE — Progress Notes (Signed)
Pre visit review using our clinic review tool, if applicable. No additional management support is needed unless otherwise documented below in the visit note. 

## 2015-12-17 NOTE — Patient Instructions (Signed)
Please go to lab, and then down to the ground floor imaging department for films of your hip and lower back.  Then you can go home and I will be in touch with your results asap!  I will also get you set up for a bone density scan.

## 2015-12-17 NOTE — Progress Notes (Addendum)
[place] [facility], [place], [place] [address] [phone]  Date:  12/17/2015   Name:  Maria [name]   DOB:  [birth year]   MRN:  [id number]  PCP:  Lamar Blinks, MD    Chief Complaint: Hip Pain (c/o right pain x 3 months. )   History of Present Illness:  Maria [name] is a 65 y.o. very pleasant female patient who presents with the following:  History of GERD and pre-diabetes Lab Results  Component Value Date   HGBA1C 6.4 10/13/2015   Here today with complaint of right hip pain- she has noted pain in her right hip only for the last 2-3 months.   It is sometimes worse after exercise.   She has to sleep on her left side. Getting up from a seated position can be painful.   The hip does not pop.   Never had any hip surgery.   She has lost a little weight but this was intentional  She has not tried any meds for her hip.    Her niece was killed not long ago allegedly by her BF- they are still awaiting trial.  This trauma has also contributed to her weight loss.  However she feels that she is managing this well and is getting better from an emotional standpoint.   Her last bone density was a couple of years ago- she is not sure where this was done. She did have osteopenia per her recollection.  BP Readings from Last 3 Encounters:  12/17/15 137/90  10/13/15 124/80  09/15/15 (!) 149/77     Wt Readings from Last 3 Encounters:  12/17/15 179 lb 9.6 oz (81.5 kg)  10/13/15 194 lb (88 kg)  09/15/15 195 lb 3.2 oz (88.5 kg)     Patient Active Problem List   Diagnosis Date Noted  . Prediabetes 10/13/2015  . Reflux esophagitis 11/20/2014  . NONSPECIFIC ABN FINDING RAD & OTH EXAM GI TRACT 09/19/2009    Past Medical History:  Diagnosis Date  . Arthritis   . Colon polyps   . Diverticulosis   . GERD (gastroesophageal reflux disease)   . Hypertension     Past Surgical History:  Procedure Laterality Date   . ESOPHAGEAL MANOMETRY N/A 12/13/2014   Procedure: ESOPHAGEAL MANOMETRY (EM);  Surgeon: Carol Ada, MD;  Location: [facility];  Service: Endoscopy;  Laterality: N/A;  . SPINE SURGERY     Neck    Social History  Substance Use Topics  . Smoking status: Never Smoker  . Smokeless tobacco: Not on file  . Alcohol use No    Family History  Problem Relation Age of Onset  . Arthritis Brother     No Known Allergies  Medication list has been reviewed and updated.  Current Outpatient Prescriptions on File Prior to Visit  Medication Sig Dispense Refill  . albuterol (PROVENTIL HFA;VENTOLIN HFA) 108 (90 Base) MCG/ACT inhaler Inhale 2 puffs into the lungs every 6 (six) hours as needed for wheezing or shortness of breath. 1 Inhaler 0  . b complex vitamins tablet Take 1 tablet by mouth daily.    . celecoxib (CELEBREX) 100 MG capsule Take 1 capsule (100 mg total) by mouth 2 (two) times daily. Use as needed for arthritis pain 60 capsule 3  . ciprofloxacin (CIPRO) 500 MG tablet Take 1 tablet (500 mg total) by mouth 2 (two) times daily. 6 tablet 0  . montelukast (SINGULAIR) 10 MG tablet  Take 1 tablet (10 mg total) by mouth at bedtime. 30 tablet 0  . Multiple Vitamin (MULTIVITAMIN) tablet Take 1 tablet by mouth daily.    Marland Kitchen omeprazole (PRILOSEC) 20 MG capsule Take 20 mg by mouth daily.     No current facility-administered medications on file prior to visit.     Review of Systems:  As per HPI- otherwise negative. No fever, chills, nausea, vomiting  Physical Examination: Vitals:   12/17/15 1152  BP: 137/90  Pulse: 95  Temp: 98.5 F (36.9 C)   Vitals:   12/17/15 1152  Weight: 179 lb 9.6 oz (81.5 kg)  Height: 5' 5.5" (1.664 m)   Body mass index is 29.43 kg/m. Ideal Body Weight: Weight in (lb) to have BMI = 25: 152.2  GEN: WDWN, NAD, Non-toxic, A & O x 3, overweight, looks well HEENT: Atraumatic, Normocephalic. Neck supple. No masses, No LAD. Ears and Nose: No external  deformity. CV: RRR, No M/G/R. No JVD. No thrill. No extra heart sounds. PULM: CTA B, no wheezes, crackles, rhonchi. No retractions. No resp. distress. No accessory muscle use. ABD: S, NT, ND EXTR: No c/c/e NEURO Normal gait.  PSYCH: Normally interactive. Conversant. Not depressed or anxious appearing.  Calm demeanor.  She has normal BLE strength and DTR, negative SLR.  Right hip: She noted pain with FABERE testing.  No pain with hip joint rotation.  Not tender over greater trochanter.  She does have some discomfort over the right sided sciatic notch, but this is not the main site of her pain. Normal lumbar flexion    Assessment and Plan: Right hip pain - Plan: DG HIP UNILAT W OR W/O PELVIS 2-3 VIEWS RIGHT, DG Lumbar Spine Complete  Osteopenia - Plan: Vitamin D (25 hydroxy), DG Bone Density  Immunization due  Leukocytosis - Plan: CBC  Encounter for immunization - Plan: Flu vaccine HIGH DOSE PF  Here today with right hip pain for 2-3 months.  Consider OA as cause. Await films and make further plans pending results Flu shot today Follow-up on mild leukocytosis with CBC- resolved Will check vitamin D and order dexa due to history of osteopenia on previous scan  Results for orders placed or performed in visit on 12/17/15  Vitamin D (25 hydroxy)  Result Value Ref Range   VITD 26.61 (L) 30.00 - 100.00 ng/mL  CBC  Result Value Ref Range   WBC 9.6 4.0 - 10.5 K/uL   RBC 4.47 3.87 - 5.11 Mil/uL   Platelets 253.0 150.0 - 400.0 K/uL   Hemoglobin 13.3 12.0 - 15.0 g/dL   HCT 39.2 36.0 - 46.0 %   MCV 87.8 78.0 - 100.0 fl   MCHC 34.0 30.0 - 36.0 g/dL   RDW 12.4 11.5 - 15.5 %   Signed Lamar Blinks, MD  Called with her radiology reports and labs on 9/28[facility] with following info  Your do have arthritis in your hip and your lower back.  I am going to refill your celebrex for you to use as needed.  However, I would try tylenol first as this would be the safest choice for your arthritis  pain.  If tylenol does not control your pain use the celebrex.   Your bone density is normal- great news!  Dg Lumbar Spine Complete  Result Date: 12/18/2015 CLINICAL DATA:  Lumbago with right-sided radicular symptoms for 3 months EXAM: LUMBAR SPINE - COMPLETE 4+ VIEW COMPARISON:  None. FINDINGS: Frontal, lateral, spot lumbosacral lateral, and bilateral oblique views were obtained.  There are 5 non-rib-bearing lumbar type vertebral bodies. There is no fracture or spondylolisthesis. There is marked disc space narrowing at L5-S1. Other disc spaces appear unremarkable. There are anterior osteophytes at L5 and S1. There is facet osteoarthritic change at L3-4, L4-5, and L5-S1 bilaterally. IMPRESSION: Areas of osteoarthritic change, most notably at L5-S1. No fracture or spondylolisthesis. Electronically Signed   By: Lowella Grip III M.D.   On: 12/18/2015 12:54   Dg Bone Density  Result Date: 12/18/2015 EXAM: DUAL X-RAY ABSORPTIOMETRY (DXA) FOR BONE MINERAL DENSITY IMPRESSION: Referring Physician:  [name] PATIENT: Name: Nahlani, Grigg Patient ID: [id number] Birth Date: 21-Aug-1950 Height: 65.5 in. Sex: Female Measured: 12/18/2015 Weight: 179.6 lbs. Indications: African-American, Estrogen Deficiency, Low Calcium Intake, Post Menopausal Fractures: Treatments: ASSESSMENT: The BMD measured at Femur Neck Left is 0.909 g/cm2 with a T-score of -0.9. This patient is considered normal according to [facility]) criteria. Site Region Measured Date Measured Age WHO YA BMD Classification T-score AP Spine L1-L4 12/18/2015 66.0 Normal -0.4 1.137 g/cm2 DualFemur Neck Left 11/18/2015 65.0 years Normal -0.9 0.909 g/cm2 World Health Organization [facility]) criteria for post-menopausal, Caucasian Women: Normal       T-score at or above -1 SD Osteopenia   T-score between -1 and -2.5 SD Osteoporosis T-score at or below -2.5 SD RECOMMENDATION: [place] recommends that FDA-approved medical  therapies be considered in postmenopausal women and men age 85 or older with a: 1. Hip or vertebral (clinical or morphometric) fracture. 2. T-score of < -2.5 at the spine or hip. 3. Ten-year fracture probability by FRAX of 3% or greater for hip fracture or 20% or greater for major osteoporotic fracture. All treatment decisions require clinical judgment and consideration of individual patient factors, including patient preferences, co-morbidities, previous drug use, risk factors not captured in the FRAX model (e.g. falls, vitamin D deficiency, increased bone turnover, interval significant decline in bone density) and possible under - or over-estimation of fracture risk by FRAX. All patients should ensure an adequate intake of dietary calcium (1200 mg/d) and vitamin D (800 IU daily) unless contraindicated. FOLLOW-UP: People with diagnosed cases of osteoporosis or at high risk for fracture should have regular bone mineral density tests. For patients eligible for Medicare, routine testing is allowed once every 2 years. The testing frequency can be increased to one year for patients who have rapidly progressing disease, those who are receiving or discontinuing medical therapy to restore bone mass, or have additional risk factors. I have reviewed this report and agree with the above findings. [facility] Radiology Electronically Signed   By: Nolon Nations M.D.   On: 12/18/2015 11:53   Dg Hip Unilat W Or W/o Pelvis 2-3 Views Right  Result Date: 12/18/2015 CLINICAL DATA:  Low back pain, right hip pain EXAM: DG HIP (WITH OR WITHOUT PELVIS) 2-3V RIGHT COMPARISON:  None. FINDINGS: Three views of the right hip submitted. No acute fracture or subluxation. Mild degenerative changes are noted bilateral hip joints with mild superior acetabular spurring and mild superior narrowing of joint space. Mild degenerative changes pubic symphysis. IMPRESSION: No acute fracture or subluxation.  Mild degenerative changes. Electronically  Signed   By: Lahoma Crocker M.D.   On: 12/18/2015 12:53

## 2015-12-18 ENCOUNTER — Ambulatory Visit (HOSPITAL_BASED_OUTPATIENT_CLINIC_OR_DEPARTMENT_OTHER)
Admission: RE | Admit: 2015-12-18 | Discharge: 2015-12-18 | Disposition: A | Discharge status: Home / Self Care | Payer: Medicare Other | Source: Ambulatory Visit | Attending: Family Medicine | Admitting: Family Medicine

## 2015-12-18 DIAGNOSIS — M47896 Other spondylosis, lumbar region: Secondary | ICD-10-CM | POA: Insufficient documentation

## 2015-12-18 DIAGNOSIS — M8580 Other specified disorders of bone density and structure, unspecified site: Secondary | ICD-10-CM | POA: Diagnosis not present

## 2015-12-18 DIAGNOSIS — M47897 Other spondylosis, lumbosacral region: Secondary | ICD-10-CM | POA: Insufficient documentation

## 2015-12-18 DIAGNOSIS — M25551 Pain in right hip: Secondary | ICD-10-CM | POA: Insufficient documentation

## 2015-12-18 DIAGNOSIS — Z780 Asymptomatic menopausal state: Secondary | ICD-10-CM | POA: Diagnosis not present

## 2015-12-18 MED ORDER — CELECOXIB 100 MG PO CAPS: 100.0000 mg | ORAL_CAPSULE | Freq: Two times a day (BID) | ORAL | 3 refills | Status: DC

## 2015-12-18 NOTE — Addendum Note (Signed)
Addended by: Lamar Blinks C on: 12/18/2015 07:56 PM   Modules accepted: Orders

## 2016-01-01 LAB — GLUCOSE, POCT (MANUAL RESULT ENTRY): POC GLUCOSE: 102 mg/dL — AB (ref 70–99)

## 2016-04-06 NOTE — Progress Notes (Deleted)
Subjective:   Maria [name] is a 66 y.o. female who presents for an Initial Medicare Annual Wellness Visit.  Review of Systems    No ROS.  Medicare Wellness Visit.   Sleep patterns: {SX; SLEEP PATTERNS:18802::"feels rested on waking","does not get up to void","gets up *** times nightly to void","*** hours nightly"}.   Home Safety/Smoke Alarms:   Living environment; residence and Firearm Safety: {Rehab home environment / accessibility:30080::"no firearms","firearms stored safely"}. Seat Belt Safety/Bike Helmet: Wears seat belt.   Counseling:   Eye Exam-  Dental-  Female:   Pap-       Mammo-  Last 05/06/11: Negative - BI-RADS 1    Dexa scan- Last 12/18/15: Normal        CCS-     Objective:    There were no vitals filed for this visit. There is no height or weight on file to calculate BMI.   Current Medications (verified) Outpatient Encounter Prescriptions as of 04/07/2016  Medication Sig  . albuterol (PROVENTIL HFA;VENTOLIN HFA) 108 (90 Base) MCG/ACT inhaler Inhale 2 puffs into the lungs every 6 (six) hours as needed for wheezing or shortness of breath.  Marland Kitchen b complex vitamins tablet Take 1 tablet by mouth daily.  . celecoxib (CELEBREX) 100 MG capsule Take 1 capsule (100 mg total) by mouth 2 (two) times daily. Use as needed for arthritis pain  . montelukast (SINGULAIR) 10 MG tablet Take 1 tablet (10 mg total) by mouth at bedtime.  . Multiple Vitamin (MULTIVITAMIN) tablet Take 1 tablet by mouth daily.  Marland Kitchen omeprazole (PRILOSEC) 20 MG capsule Take 20 mg by mouth daily.   No facility-administered encounter medications on file as of 04/07/2016.     Allergies (verified) Patient has no known allergies.   History: Past Medical History:  Diagnosis Date  . Arthritis   . Colon polyps   . Diverticulosis   . GERD (gastroesophageal reflux disease)   . Hypertension    Past Surgical History:  Procedure Laterality Date  . ESOPHAGEAL MANOMETRY N/A 12/13/2014   Procedure: ESOPHAGEAL  MANOMETRY (EM);  Surgeon: Carol Ada, MD;  Location: [facility];  Service: Endoscopy;  Laterality: N/A;  . SPINE SURGERY     Neck   Family History  Problem Relation Age of Onset  . Arthritis Brother    Social History   Occupational History  . Retired    Social History Main Topics  . Smoking status: Never Smoker  . Smokeless tobacco: Not on file  . Alcohol use No  . Drug use: No  . Sexual activity: Not on file    Tobacco Counseling Counseling given: Not Answered   Activities of Daily Living No flowsheet data found.  Immunizations and Health Maintenance Immunization History  Administered Date(s) Administered  . Influenza Split 01/17/2013, 12/21/2014  . Influenza, High Dose Seasonal PF 12/17/2015  . Pneumococcal Conjugate-13 02/05/2015  . Td 02/05/2015   Health Maintenance Due  Topic Date Due  . ZOSTAVAX  12/02/2009  . MAMMOGRAM  05/05/2013  . PNA vac Low Risk Adult (2 of 2 - PPSV23) 02/05/2016    Patient Care Team: [name], MD as PCP - General (Family Medicine)  Indicate any recent Medical Services you may have received from other than [facility] providers in the past year (date may be approximate).     Assessment:   This is a routine wellness examination for Maria [name]. Physical assessment deferred to PCP.   Hearing/Vision screen No exam data present  Dietary issues and exercise activities  discussed:   Diet (meal preparation, eat out, water intake, caffeinated beverages, dairy products, fruits and vegetables): {Desc; diets:16563} Breakfast: Lunch:  Dinner:      Goals    None     Depression Screen PHQ 2/9 Scores 10/13/2015 09/15/2015 02/05/2015  PHQ - 2 Score 0 0 0    Fall Risk Fall Risk  10/13/2015 09/15/2015 02/05/2015  Falls in the past year? No No No    Cognitive Function:        Screening Tests Health Maintenance  Topic Date Due  . ZOSTAVAX  12/02/2009  . MAMMOGRAM  05/05/2013  . PNA vac Low Risk Adult (2 of 2 - PPSV23)  02/05/2016  . COLONOSCOPY  11/18/2024  . TETANUS/TDAP  02/04/2025  . INFLUENZA VACCINE  Addressed  . DEXA SCAN  Completed  . Hepatitis C Screening  Completed      Plan:   ***  During the course of the visit, Maria [name] was educated and counseled about the following appropriate screening and preventive services:   Vaccines to include Pneumoccal, Influenza, Hepatitis B, Td, Zostavax, HCV  Electrocardiogram  Cardiovascular disease screening  Colorectal cancer screening  Bone density screening  Diabetes screening  Glaucoma screening  Mammography/PAP  Nutrition counseling  Smoking cessation counseling  Patient Instructions (the written plan) were given to the patient.    Shela Nevin, [place]   04/06/2016

## 2016-04-06 NOTE — Progress Notes (Deleted)
Pre visit review using our clinic review tool, if applicable. No additional management support is needed unless otherwise documented below in the visit note. 

## 2016-04-07 ENCOUNTER — Ambulatory Visit: Payer: Medicare Other | Admitting: *Deleted

## 2016-04-13 ENCOUNTER — Telehealth: Payer: Self-pay | Admitting: Family Medicine

## 2016-04-13 NOTE — Telephone Encounter (Signed)
Patient scheduled medicare wellness appointment 05/24/16 at 11am with Glenard Haring and physical with PCP at 12pm.

## 2016-04-22 LAB — HM MAMMOGRAPHY: HM MAMMO: NORMAL (ref 0–4)

## 2016-04-28 LAB — HM DEXA SCAN: HM DEXA SCAN: NORMAL

## 2016-05-07 ENCOUNTER — Ambulatory Visit (INDEPENDENT_AMBULATORY_CARE_PROVIDER_SITE_OTHER): Payer: Medicare Other | Admitting: Medical

## 2016-05-07 VITALS — BP 132/70 | HR 72 | Ht 66.0 in | Wt 187.0 lb

## 2016-05-07 DIAGNOSIS — B070 Plantar wart: Secondary | ICD-10-CM

## 2016-05-07 DIAGNOSIS — M2011 Hallux valgus (acquired), right foot: Secondary | ICD-10-CM | POA: Diagnosis not present

## 2016-05-07 DIAGNOSIS — R739 Hyperglycemia, unspecified: Secondary | ICD-10-CM | POA: Diagnosis not present

## 2016-05-07 LAB — COMPREHENSIVE METABOLIC PANEL
ALBUMIN: 4.3 g/dL (ref 3.5–5.2)
ALK PHOS: 92 U/L (ref 39–117)
ALT: 14 U/L (ref 0–35)
AST: 15 U/L (ref 0–37)
BUN: 16 mg/dL (ref 6–23)
CO2: 28 mEq/L (ref 19–32)
Calcium: 9.6 mg/dL (ref 8.4–10.5)
Chloride: 103 mEq/L (ref 96–112)
Creatinine, Ser: 0.86 mg/dL (ref 0.40–1.20)
GFR: 70.08 mL/min (ref >60.00)
GLUCOSE: 89 mg/dL (ref 70–99)
POTASSIUM: 3.5 meq/L (ref 3.5–5.1)
Sodium: 137 mEq/L (ref 135–145)
TOTAL PROTEIN: 7.6 g/dL (ref 6.0–8.3)
Total Bilirubin: 0.3 mg/dL (ref 0.2–1.2)

## 2016-05-07 LAB — HEMOGLOBIN A1C: HEMOGLOBIN A1C: 6.2 % (ref 4.6–6.5)

## 2016-05-07 NOTE — Progress Notes (Signed)
Pre visit review using our clinic review tool, if applicable. No additional management support is needed unless otherwise documented below in the visit note. 

## 2016-05-07 NOTE — Patient Instructions (Addendum)
You appear to have hallux valgus with small callus on rt foot. Also on left foot likely plantar wart.  I will refer you to podiatrist for both.  Will get your a1c and cmp today. Knowing sugar average would be helpful in event medicine such as compound w needs to be applied.   Follow up with pcp as regularly scheduled or as needed

## 2016-05-07 NOTE — Progress Notes (Signed)
Subjective:    Patient ID: Maria [name], female    DOB: 03-07-1951, 66 y.o.   MRN: [id number]  HPI  Pt in for left heel small lump/bump for 3 weeks. Pt felt putting lotion on her foot. If she squeezes area ill have pain. Saw small black dot in center.  Pt has change to rt great to base redness, callous formation and great toe mild deviated. Pt has history of old injury as child. to rt great toe area as child.(Pt states was severe injury and had to have surgery)  Pt a1c in past in summer was 6.4. Mild elevation of sugar in past at 106.    Review of Systems  Constitutional: Negative for chills, fatigue and fever.  Respiratory: Negative for cough, chest tightness, shortness of breath and wheezing.   Cardiovascular: Negative for chest pain and palpitations.  Gastrointestinal: Negative for abdominal pain.  Musculoskeletal: Negative for back pain.       Hallux valgus  Skin:       Likely plantar wart.   Past Medical History:  Diagnosis Date  . Arthritis   . Colon polyps   . Diverticulosis   . GERD (gastroesophageal reflux disease)   . Hypertension      Social History   Social History  . Marital status: Widowed    Spouse name: N/A  . Number of children: 3  . Years of education: 12   Occupational History  . Retired    Social History Main Topics  . Smoking status: Never Smoker  . Smokeless tobacco: Not on file  . Alcohol use No  . Drug use: No  . Sexual activity: Not on file   Other Topics Concern  . Not on file   Social History Narrative  . No narrative on file    Past Surgical History:  Procedure Laterality Date  . ESOPHAGEAL MANOMETRY N/A 12/13/2014   Procedure: ESOPHAGEAL MANOMETRY (EM);  Surgeon: Carol Ada, MD;  Location: [facility];  Service: Endoscopy;  Laterality: N/A;  . SPINE SURGERY     Neck    Family History  Problem Relation Age of Onset  . Arthritis Brother     No Known Allergies  Current Outpatient Prescriptions on File Prior to  Visit  Medication Sig Dispense Refill  . albuterol (PROVENTIL HFA;VENTOLIN HFA) 108 (90 Base) MCG/ACT inhaler Inhale 2 puffs into the lungs every 6 (six) hours as needed for wheezing or shortness of breath. 1 Inhaler 0  . b complex vitamins tablet Take 1 tablet by mouth daily.    . celecoxib (CELEBREX) 100 MG capsule Take 1 capsule (100 mg total) by mouth 2 (two) times daily. Use as needed for arthritis pain 60 capsule 3  . montelukast (SINGULAIR) 10 MG tablet Take 1 tablet (10 mg total) by mouth at bedtime. 30 tablet 0  . Multiple Vitamin (MULTIVITAMIN) tablet Take 1 tablet by mouth daily.    Marland Kitchen omeprazole (PRILOSEC) 20 MG capsule Take 20 mg by mouth daily.     No current facility-administered medications on file prior to visit.     BP 132/70   Pulse 72   Ht 5\' 6"  (1.676 m)   Wt 187 lb (84.8 kg)   SpO2 100%   BMI 30.18 kg/m       Objective:   Physical Exam  General- No acute distress. Pleasant patient. Neck- Full range of motion, no jvd Lungs- Clear, even and unlabored. Heart- regular rate and rhythm. Neurologic- CNII- XII grossly intact.  Left heel- latera aspect plantar wart lesion appearance. Rt foot- hallux valgus. Callous formation mild at base of toe. Faint redness. No warmth.        Assessment & Plan:  You appear to have hallux valgus with small callus on rt foot. Also on left foot likely plantar wart.  I will refer you to podiatrist for both.  Will get your a1c and cmp today. Knowing sugar average would be helpful in event medicine such as compound w needs to be applied.   Follow up with pcp as regularly scheduled or as needed

## 2016-05-12 ENCOUNTER — Encounter: Payer: Self-pay | Admitting: Podiatry

## 2016-05-12 ENCOUNTER — Ambulatory Visit (INDEPENDENT_AMBULATORY_CARE_PROVIDER_SITE_OTHER): Payer: Medicare Other | Admitting: Podiatry

## 2016-05-12 VITALS — BP 148/82 | HR 56 | Ht 66.0 in | Wt 187.0 lb

## 2016-05-12 DIAGNOSIS — Q828 Other specified congenital malformations of skin: Secondary | ICD-10-CM

## 2016-05-12 DIAGNOSIS — M21619 Bunion of unspecified foot: Secondary | ICD-10-CM

## 2016-05-12 DIAGNOSIS — M79672 Pain in left foot: Secondary | ICD-10-CM

## 2016-05-12 NOTE — Patient Instructions (Signed)
Seen for painful lesion left foot. Noted of porokeratotic lesion under left foot. Debrided. Return as needed.

## 2016-05-12 NOTE — Progress Notes (Signed)
SUBJECTIVE: 66 y.o. year old female presents complaining of painful knot under left plantar lateral, painful x 3 weeks. Has right foot bunion with an old scar from a cut that is not painful. Stays active with her daily walking exercise. Blood sugar was under control checked by PCP yesterday.   REVIEW OF SYSTEMS: Negative other than Diverticulitis and Acid reflux. Occasional arthritic pain in right hand.    OBJECTIVE: DERMATOLOGIC EXAMINATION: Plantar porokeratosis under left foot midlateral surface.  VASCULAR EXAMINATION OF LOWER LIMBS: All pedal pulses are palpable with normal pulsation.  Capillary Filling times within 3 seconds in all digits.  No edema or erythema noted. Temperature gradient from tibial crest to dorsum of foot is within normal bilateral.  NEUROLOGIC EXAMINATION OF THE LOWER LIMBS: Achilles DTR is present and within normal. Monofilament (Semmes-Weinstein 10-gm) sensory testing positive 6 out of 6, bilateral. Vibratory sensations(128Hz  turning fork) intact at medial and lateral forefoot bilateral.  Sharp and Dull discriminatory sensations at the plantar ball of hallux is intact bilateral.   MUSCULOSKELETAL EXAMINATION: Positive for bunion deformity R>L, asymptomatic. Short first digit bilateral.   ASSESSMENT: Painful porokeratosis left foot. Bunion deformity R>L.  PLAN: Reviewed clinical findings, nature of prognosis of the lesion, and available treatment options. Keratotic lesion debrided. Alleviated pain. Return as needed.

## 2016-05-21 NOTE — Progress Notes (Signed)
Subjective:   Maria [name] is a 66 y.o. female who presents for Medicare Annual (Subsequent) preventive examination.  Review of Systems:  No ROS.  Medicare Wellness Visit. Cardiac Risk Factors include: advanced age (>68men, >44 women);sedentary lifestyle Sleep patterns:   Sleeps 7-8 hrs per night. Feels rested. Home Safety/Smoke Alarms:  Feels safe in home. Smoke alarms in place.  Living environment; residence and Firearm Safety: Lives alone on first floor apt. No guns.  Seat Belt Safety/Bike Helmet: Wears seat belt.   Counseling:   Eye Exam- Wears glasses. Eye doctor every 2 years. Dental- Only as needed.  Female:   Pap- Dr.Megan Morris in Feb 2017. Normal per pt.     Mammo- Last 2/1/ 17: Normal per pt.   Dexa scan- last 12/18/15: Normal         CCS- Last 11/19/14: Polyp removed.diverticulosis. Repeat in 5 yrs per report.     Objective:     Vitals: BP 130/64 (BP Location: Right Arm, Patient Position: Sitting, Cuff Size: Normal)   Pulse 70   Ht 5\' 6"  (1.676 m)   Wt 178 lb 6.4 oz (80.9 kg)   SpO2 98%   BMI 28.79 kg/m   Body mass index is 28.79 kg/m.   Tobacco History  Smoking Status  . Never Smoker  Smokeless Tobacco  . Never Used     Counseling given: No   Past Medical History:  Diagnosis Date  . Arthritis   . Colon polyps   . Diverticulosis   . GERD (gastroesophageal reflux disease)   . Hypertension    Past Surgical History:  Procedure Laterality Date  . ESOPHAGEAL MANOMETRY N/A 12/13/2014   Procedure: ESOPHAGEAL MANOMETRY (EM);  Surgeon: Carol Ada, MD;  Location: [facility];  Service: Endoscopy;  Laterality: N/A;  . SPINE SURGERY     Neck   Family History  Problem Relation Age of Onset  . Arthritis Brother    History  Sexual Activity  . Sexual activity: Not on file    Outpatient Encounter Prescriptions as of 05/24/2016  Medication Sig  . albuterol (PROVENTIL HFA;VENTOLIN HFA) 108 (90 Base) MCG/ACT inhaler Inhale 2 puffs into the lungs  every 6 (six) hours as needed for wheezing or shortness of breath.  . Multiple Vitamin (MULTIVITAMIN) tablet Take 1 tablet by mouth daily.  Marland Kitchen omeprazole (PRILOSEC) 20 MG capsule Take 20 mg by mouth daily.  Marland Kitchen b complex vitamins tablet Take 1 tablet by mouth daily.  . celecoxib (CELEBREX) 100 MG capsule Take 1 capsule (100 mg total) by mouth 2 (two) times daily. Use as needed for arthritis pain (Patient not taking: Reported on 05/24/2016)  . montelukast (SINGULAIR) 10 MG tablet Take 1 tablet (10 mg total) by mouth at bedtime. (Patient not taking: Reported on 05/24/2016)   No facility-administered encounter medications on file as of 05/24/2016.     Activities of Daily Living In your present state of health, do you have any difficulty performing the following activities: 05/24/2016  Hearing? N  Vision? N  Difficulty concentrating or making decisions? N  Dressing or bathing? N  Doing errands, shopping? N  Preparing Food and eating ? N  Using the Toilet? N  In the past six months, have you accidently leaked urine? N  Do you have problems with loss of bowel control? N  Managing your Medications? N  Managing your Finances? N  Housekeeping or managing your Housekeeping? N  Some recent data might be hidden    Patient Care Team:  [name], MD as PCP - General (Family Medicine)    Assessment:    Physical assessment deferred to PCP.  Exercise Activities and Dietary recommendations Current Exercise Habits: Home exercise routine, Type of exercise: strength training/weights;treadmill, Time (Minutes): 45, Frequency (Times/Week): 3, Weekly Exercise (Minutes/Week): 135   Diet (meal preparation, eat out, water intake, caffeinated beverages, dairy products, fruits and vegetables): in general, a "healthy" diet         Goals    . Lose 15 lbs          With a healthy diet and exercise.      Fall Risk Fall Risk  05/24/2016 10/13/2015 09/15/2015 02/05/2015  Falls in the past year? No No No No    Depression Screen PHQ 2/9 Scores 05/24/2016 10/13/2015 09/15/2015 02/05/2015  PHQ - 2 Score 0 0 0 0     Cognitive Function MMSE - Mini Mental State Exam 05/24/2016  Orientation to time 5  Orientation to Place 5  Registration 3  Attention/ Calculation 5  Recall 2  Language- name 2 objects 2  Language- repeat 1  Language- follow 3 step command 3  Language- read & follow direction 1  Write a sentence 1  Copy design 1  Total score 29        Immunization History  Administered Date(s) Administered  . Influenza Split 01/17/2013, 12/21/2014  . Influenza, High Dose Seasonal PF 12/17/2015  . Pneumococcal Conjugate-13 02/05/2015  . Td 02/05/2015   Screening Tests Health Maintenance  Topic Date Due  . MAMMOGRAM  05/05/2013  . PNA vac Low Risk Adult (2 of 2 - PPSV23) 02/05/2016  . COLONOSCOPY  11/18/2024  . TETANUS/TDAP  02/04/2025  . INFLUENZA VACCINE  Addressed  . DEXA SCAN  Completed  . Hepatitis C Screening  Completed      Plan:     Continue to eat heart healthy diet (full of fruits, vegetables, whole grains, lean protein, water--limit salt, fat, and sugar intake) and increase physical activity as tolerated.  Continue doing brain stimulating activities (puzzles, reading, adult coloring books, staying active) to keep memory sharp.   During the course of the visit the patient was educated and counseled about the following appropriate screening and preventive services:   Vaccines to include Pneumoccal, Influenza, Hepatitis B, Td, Zostavax, HCV  Cardiovascular Disease  Colorectal cancer screening  Bone density screening  Diabetes screening  Glaucoma screening  Mammography/PAP  Nutrition counseling   Patient Instructions (the written plan) was given to the patient.   Maria [name], [place]  05/24/2016  I have reviewed MWE as above by [name] and agree with her documentationDenny Peon MD

## 2016-05-21 NOTE — Progress Notes (Signed)
Pre visit review using our clinic review tool, if applicable. No additional management support is needed unless otherwise documented below in the visit note. 

## 2016-05-24 ENCOUNTER — Ambulatory Visit (INDEPENDENT_AMBULATORY_CARE_PROVIDER_SITE_OTHER): Payer: Medicare Other | Admitting: Family Medicine

## 2016-05-24 ENCOUNTER — Encounter: Payer: Self-pay | Admitting: Family Medicine

## 2016-05-24 VITALS — BP 130/64 | HR 70 | Ht 66.0 in | Wt 178.4 lb

## 2016-05-24 DIAGNOSIS — J3489 Other specified disorders of nose and nasal sinuses: Secondary | ICD-10-CM | POA: Diagnosis not present

## 2016-05-24 DIAGNOSIS — R7303 Prediabetes: Secondary | ICD-10-CM | POA: Diagnosis not present

## 2016-05-24 DIAGNOSIS — Z0000 Encounter for general adult medical examination without abnormal findings: Secondary | ICD-10-CM | POA: Diagnosis not present

## 2016-05-24 NOTE — Progress Notes (Signed)
[place] at [facility] [address], [place], [place] [address] [phone]  Date:  05/24/2016   Name:  Maria [name]   DOB:  1950-10-23   MRN:  [id number]  PCP:  Lamar Blinks, MD    Chief Complaint: Medicare Wellness   History of Present Illness:  Maria [name] is a 66 y.o. very pleasant female patient who presents with the following:  Here for CPE today.  Last CPE 02-05-2015.    Feels like she is coming down with some sinus issues.  This morning her mouth and nasal passages are dry.  Some nasal congestion.  Thought maybe he throat was dry this AM, so she took some Tylenol.  No cough, headache, sinus pressure, sneezing, drainage or mucus.  Denies CP.  Working in a [employer] right now.  This job will be ending the week before Easter.  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  Is wanting to go back to school for medical coding.  Is going to look into what classes she might be able to take for free as a retiree.    Denies SOB, has not used albuterol in so long, that she doesn't know where the inhaler is.  Has occasional reflux.  Paying better attention to not eating late, certain foods and sitting up after eating.  Will take a Zantac PRN, if reflux flares up.  Is not taking a medications at this time.  Denies any pain of any sort.  No fever or chills She is overall feeling well Had her medicare wellness exam today She has a history of pre-diabetes with good recent A1c  Lab Results  Component Value Date   HGBA1C 6.2 05/07/2016   She is NOT fasting today  Patient Active Problem List   Diagnosis Date Noted  . Prediabetes 10/13/2015  . Reflux esophagitis 11/20/2014  . NONSPECIFIC ABN FINDING RAD & OTH EXAM GI TRACT 09/19/2009    Past Medical History:  Diagnosis Date  . Arthritis   . Colon polyps   . Diverticulosis   . GERD (gastroesophageal reflux disease)   . Hypertension     Past Surgical History:   Procedure Laterality Date  . ESOPHAGEAL MANOMETRY N/A 12/13/2014   Procedure: ESOPHAGEAL MANOMETRY (EM);  Surgeon: Carol Ada, MD;  Location: [facility];  Service: Endoscopy;  Laterality: N/A;  . SPINE SURGERY     Neck    Social History  Substance Use Topics  . Smoking status: Never Smoker  . Smokeless tobacco: Never Used  . Alcohol use No    Family History  Problem Relation Age of Onset  . Arthritis Brother     No Known Allergies  Medication list has been reviewed and updated.  Current Outpatient Prescriptions on File Prior to Visit  Medication Sig Dispense Refill  . albuterol (PROVENTIL HFA;VENTOLIN HFA) 108 (90 Base) MCG/ACT inhaler Inhale 2 puffs into the lungs every 6 (six) hours as needed for wheezing or shortness of breath. 1 Inhaler 0  . Multiple Vitamin (MULTIVITAMIN) tablet Take 1 tablet by mouth daily.    Marland Kitchen omeprazole (PRILOSEC) 20 MG capsule Take 20 mg by mouth daily.    Marland Kitchen b complex vitamins tablet Take 1 tablet by mouth daily.    . celecoxib (CELEBREX) 100 MG capsule Take 1 capsule (100 mg total) by mouth 2 (two) times daily. Use as needed for arthritis pain (Patient not taking: Reported on 05/24/2016) 60 capsule 3  . montelukast (SINGULAIR)  10 MG tablet Take 1 tablet (10 mg total) by mouth at bedtime. (Patient not taking: Reported on 05/24/2016) 30 tablet 0   No current facility-administered medications on file prior to visit.     Review of Systems:  As per HPI- otherwise negative.   Physical Examination: Vitals:   05/24/16 1115  BP: 130/64  Pulse: 70   Vitals:   05/24/16 1115  Weight: 178 lb 6.4 oz (80.9 kg)  Height: 5\' 6"  (1.676 m)   Body mass index is 28.79 kg/m. Ideal Body Weight: Weight in (lb) to have BMI = 25: 154.6  GEN: WDWN, NAD, Non-toxic, A & O x 3, overweight, looks well HEENT: Atraumatic, Normocephalic. Neck supple. No masses, No LAD.  Bilateral TM wnl, oropharynx normal.  PEERL,EOMI.   Nasal cavity is inflamed slightly, more so on  the left  Ears and Nose: No external deformity. CV: RRR, No M/G/R. No JVD. No thrill. No extra heart sounds. PULM: CTA B, no wheezes, crackles, rhonchi. No retractions. No resp. distress. No accessory muscle use. ABD: S, NT, ND EXTR: No c/c/e NEURO Normal gait.  PSYCH: Normally interactive. Conversant. Not depressed or anxious appearing.  Calm demeanor.    Assessment and Plan:   Encounter for Medicare annual wellness exam  Nasal dryness  Pre-diabetes  Here today for her medicare wellness exam- see RN note.  She notes that her nose feels dry but otherwise no sx of current illness Her labs are UTD She will let me know if her sx do not go away with the conservative measures we discussed today   Patient instructions:    Signed Lamar Blinks, MD

## 2016-05-24 NOTE — Patient Instructions (Addendum)
Continue to eat heart healthy diet (full of fruits, vegetables, whole grains, lean protein, water--limit salt, fat, and sugar intake) and increase physical activity as tolerated.  Continue doing brain stimulating activities (puzzles, reading, adult coloring books, staying active) to keep memory sharp.   It was great to see you today; for your dry nose try a nasal saline spray to irrigate and wash out your nose. Placing a small amount of vaseline in your nostrils can also be helpful for dryness.  Take care and please do let me know if your symptoms do not resolve- Sooner if worse.  Otherwise please see me in 6 months

## 2016-07-19 ENCOUNTER — Telehealth: Payer: Self-pay

## 2016-07-19 NOTE — Telephone Encounter (Signed)
Clearance form received via fax from Dr. Burt Knack, [place], [place].  Form forwarded to PCP for review and completion.

## 2016-07-19 NOTE — Telephone Encounter (Signed)
Ok will look for it

## 2016-08-24 NOTE — Progress Notes (Signed)
[place] at [facility] [address] [place], [place] [address] [phone]  Date:  08/25/2016   Name:  Maria [name]   DOB:  1950/09/06   MRN:  [id number]  PCP:  [name], MD    Chief Complaint: Foot Pain (c/o right foot discomfort x over 6 months. Pt feels the discomfort is getting worse. )   History of Present Illness:  Maria [name] is a 66 y.o. very pleasant female patient who presents with the following:  Seen here for a CPE/ MWE about 3 months ago History of pre-diabetes, reflux  Here today with pain in her right foot-  She did see Percell Miller for this back in February- noted to have hallux valgus.  However she feels that her current sx are different from what brought her in during February She feels as though her mid foot hurts She seems to be afraid that she is getting a blood clot in her foot which will cause a stroke No history of blood clot, no calf pain or swelling NKI  She has noted pain in her foot for the last 6 months She notes that pain in her foot is causing her to alter her gait and may be causing right hip pain.  She cannot really tell if the pain in her hip is coming from her foot or not  She did go to [facility] on [address] and had her prevnar last year- not quite time for pneumovax yet  Patient Active Problem List   Diagnosis Date Noted  . Prediabetes 10/13/2015  . Reflux esophagitis 11/20/2014  . NONSPECIFIC ABN FINDING RAD & OTH EXAM GI TRACT 09/19/2009    Past Medical History:  Diagnosis Date  . Arthritis   . Colon polyps   . Diverticulosis   . GERD (gastroesophageal reflux disease)   . Hypertension     Past Surgical History:  Procedure Laterality Date  . ESOPHAGEAL MANOMETRY N/A 12/13/2014   Procedure: ESOPHAGEAL MANOMETRY (EM);  Surgeon: Carol Ada, MD;  Location: [facility];  Service: Endoscopy;  Laterality: N/A;  . SPINE SURGERY     Neck    Social History  Substance Use  Topics  . Smoking status: Never Smoker  . Smokeless tobacco: Never Used  . Alcohol use No    Family History  Problem Relation Age of Onset  . Arthritis Brother     No Known Allergies  Medication list has been reviewed and updated.  Current Outpatient Prescriptions on File Prior to Visit  Medication Sig Dispense Refill  . albuterol (PROVENTIL HFA;VENTOLIN HFA) 108 (90 Base) MCG/ACT inhaler Inhale 2 puffs into the lungs every 6 (six) hours as needed for wheezing or shortness of breath. 1 Inhaler 0  . b complex vitamins tablet Take 1 tablet by mouth daily.    . celecoxib (CELEBREX) 100 MG capsule Take 1 capsule (100 mg total) by mouth 2 (two) times daily. Use as needed for arthritis pain 60 capsule 3  . montelukast (SINGULAIR) 10 MG tablet Take 1 tablet (10 mg total) by mouth at bedtime. 30 tablet 0  . Multiple Vitamin (MULTIVITAMIN) tablet Take 1 tablet by mouth daily.    Marland Kitchen omeprazole (PRILOSEC) 20 MG capsule Take 20 mg by mouth daily.     No current facility-administered medications on file prior to visit.     Review of Systems:  As per HPI- otherwise negative.   Physical Examination: Vitals:   08/25/16 5638  BP: 140/82  Pulse: 62  Temp: 97.8 F (36.6 C)   Vitals:   08/25/16 0927  Weight: 184 lb 12.8 oz (83.8 kg)  Height: 5\' 6"  (1.676 m)   Body mass index is 29.83 kg/m. Ideal Body Weight: Weight in (lb) to have BMI = 25: 154.6  GEN: WDWN, NAD, Non-toxic, A & O x 3, overweight, otherwise looks well HEENT: Atraumatic, Normocephalic. Neck supple. No masses, No LAD. Ears and Nose: No external deformity. CV: RRR, No M/G/R. No JVD. No thrill. No extra heart sounds. PULM: CTA B, no wheezes, crackles, rhonchi. No retractions. No resp. distress. No accessory muscle use. ABD: S, NT, ND, +BS. No rebound. No HSM. EXTR: No c/c/e NEURO Normal gait.  PSYCH: Normally interactive. Conversant. Not depressed or anxious appearing.  Calm demeanor.  She has normal right hip ROM and  I am not able to reproduce any tenderness in her hip or over the SI joint Foot has normal circulation and perfusion.  Mild bunion.  She has some tenderness to squeeze of mid foot which may indicate a neuroma   Dg Foot Complete Right  Result Date: 08/25/2016 CLINICAL DATA:  2 months of plantar right foot pain with no known injury EXAM: RIGHT FOOT COMPLETE - 3+ VIEW COMPARISON:  None in PACs FINDINGS: The bones of the right foot are subjectively adequately mineralized. There is mild narrowing of the interphalangeal joint spaces diffusely. The first MTP joint exhibits moderate narrowing and there is a hallux valgus contour of the first ray. The other MTP joints appear normal. The tarsometatarsal and intertarsal joint spaces are well maintained. There is a moderate-sized plantar calcaneal spur with smaller Achilles region spur. A punctate calcification is noted in the soft tissues of the heel pad. IMPRESSION: No acute abnormality of the plantar region of foot is observed. There is hallux valgus contour of the first ray with mild degenerative change of the first MTP joint. Mild diffuse interphalangeal joint degenerative change is present. Moderate-sized plantar calcaneal spur. Punctate radiodensity in the soft tissues of the heel pad may reflect a foreign body. Electronically Signed   By: David  Martinique M.D.   On: 08/25/2016 10:20   Dg Hip Unilat W Or W/o Pelvis 2-3 Views Right  Result Date: 08/25/2016 CLINICAL DATA:  Posterior right hip pain radiates to right leg. EXAM: DG HIP (WITH OR WITHOUT PELVIS) 2-3V RIGHT COMPARISON:  12/10/2015 FINDINGS: No fracture. No subluxation or dislocation. Minimal degenerative change in the right hip is similar to prior. IMPRESSION: Mild degeneration right acetabulum.  No acute bony findings. Electronically Signed   By: Misty Stanley M.D.   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

## 2016-08-25 ENCOUNTER — Ambulatory Visit (HOSPITAL_BASED_OUTPATIENT_CLINIC_OR_DEPARTMENT_OTHER)
Admission: RE | Admit: 2016-08-25 | Discharge: 2016-08-25 | Disposition: A | Discharge status: Home / Self Care | Payer: Medicare Other | Source: Ambulatory Visit | Attending: Family Medicine | Admitting: Family Medicine

## 2016-08-25 ENCOUNTER — Ambulatory Visit (INDEPENDENT_AMBULATORY_CARE_PROVIDER_SITE_OTHER): Payer: Medicare Other | Admitting: Family Medicine

## 2016-08-25 VITALS — BP 140/82 | HR 62 | Temp 97.8°F | Ht 66.0 in | Wt 184.8 lb

## 2016-08-25 DIAGNOSIS — M25551 Pain in right hip: Secondary | ICD-10-CM | POA: Diagnosis not present

## 2016-08-25 DIAGNOSIS — M79671 Pain in right foot: Secondary | ICD-10-CM

## 2016-08-25 DIAGNOSIS — M1990 Unspecified osteoarthritis, unspecified site: Secondary | ICD-10-CM | POA: Diagnosis not present

## 2016-08-25 DIAGNOSIS — M2011 Hallux valgus (acquired), right foot: Secondary | ICD-10-CM | POA: Diagnosis not present

## 2016-08-25 DIAGNOSIS — M19071 Primary osteoarthritis, right ankle and foot: Secondary | ICD-10-CM | POA: Insufficient documentation

## 2016-08-25 DIAGNOSIS — M7731 Calcaneal spur, right foot: Secondary | ICD-10-CM | POA: Insufficient documentation

## 2016-08-25 DIAGNOSIS — M1611 Unilateral primary osteoarthritis, right hip: Secondary | ICD-10-CM | POA: Diagnosis not present

## 2016-08-25 MED ORDER — CELECOXIB 100 MG PO CAPS: 100.0000 mg | ORAL_CAPSULE | Freq: Two times a day (BID) | ORAL | 6 refills | Status: DC

## 2016-08-25 NOTE — Patient Instructions (Signed)
You do have mild arthritis in your hip, but this does not appear to be any worse than last year.  I am going to have you see a foot doctor about your right foot pain Also, use your celebrex as needed for pain- remember when you are taking this do not also use aleve or motrin

## 2016-09-09 ENCOUNTER — Ambulatory Visit (INDEPENDENT_AMBULATORY_CARE_PROVIDER_SITE_OTHER): Payer: Medicare Other | Admitting: Family Medicine

## 2016-09-09 ENCOUNTER — Encounter: Payer: Self-pay | Admitting: Family Medicine

## 2016-09-09 ENCOUNTER — Telehealth: Payer: Self-pay | Admitting: Family Medicine

## 2016-09-09 VITALS — BP 126/68 | HR 58 | Temp 98.3°F | Resp 14 | Ht 66.0 in | Wt 186.5 lb

## 2016-09-09 DIAGNOSIS — R109 Unspecified abdominal pain: Secondary | ICD-10-CM

## 2016-09-09 DIAGNOSIS — R350 Frequency of micturition: Secondary | ICD-10-CM | POA: Diagnosis not present

## 2016-09-09 DIAGNOSIS — R319 Hematuria, unspecified: Secondary | ICD-10-CM

## 2016-09-09 LAB — POCT URINALYSIS DIPSTICK
Bilirubin, UA: NEGATIVE
GLUCOSE UA: NEGATIVE
KETONES UA: NEGATIVE
Leukocytes, UA: NEGATIVE
Nitrite, UA: NEGATIVE
Protein, UA: NEGATIVE
SPEC GRAV UA: 1.015 (ref 1.010–1.025)
Urobilinogen, UA: 4 E.U./dL — AB
pH, UA: 6 (ref 5.0–8.0)

## 2016-09-09 MED ORDER — TRAMADOL HCL 50 MG PO TABS: 50.0000 mg | ORAL_TABLET | Freq: Two times a day (BID) | ORAL | 0 refills | Status: DC | PRN

## 2016-09-09 NOTE — Telephone Encounter (Signed)
pt prefers call when results are in for urine culture from 09/09/16 with Dr. Nani Ravens

## 2016-09-09 NOTE — Patient Instructions (Addendum)
Someone will reach to you regarding your ultrasound.  Give [place] around 2-3 days to get the results of your urine culture back.   Keep a food/symptom diary for your symptoms and bring them to your next appointment.   Come to your next appointment ready to leave a urine sample.

## 2016-09-09 NOTE — Progress Notes (Signed)
Chief Complaint  Patient presents with  . Abdominal Pain    x2-3 days, RLQ, denies fever, chills, n/v     Maria [name] is here for R abdominal pain.  Duration: 7 days Nighttime awakenings? No Bleeding? No Weight loss? No Palliation: Unknown Provocation: Drinking soda Associated symptoms: increased urination Denies: fever, nausea, vomiting, diarrhea, constipation, dark/tarry stool, injury, change in activity, blood in urine, pain with urination, retention or flank pain Treatment to date: None  ROS: Constitutional: No fevers GI: No N/V/D/C, no bleeding + pain  Past Medical History:  Diagnosis Date  . Arthritis   . Colon polyps   . Diverticulosis   . GERD (gastroesophageal reflux disease)   . Hypertension    Family History  Problem Relation Age of Onset  . Arthritis Brother    Past Surgical History:  Procedure Laterality Date  . ESOPHAGEAL MANOMETRY N/A 12/13/2014   Procedure: ESOPHAGEAL MANOMETRY (EM);  Surgeon: Carol Ada, MD;  Location: [facility];  Service: Endoscopy;  Laterality: N/A;  . SPINE SURGERY     Neck    BP 126/68 (BP Location: Left Arm, Patient Position: Sitting, Cuff Size: Small)   Pulse (!) 58   Temp 98.3 F (36.8 C) (Oral)   Resp 14   Ht 5\' 6"  (1.676 m)   Wt 186 lb 8 oz (84.6 kg)   SpO2 98%   BMI 30.10 kg/m  Gen.: Awake, alert, appears stated age 66: Mucous membranes moist without mucosal lesions Heart: Regular rate and rhythm without murmurs Lungs: Clear auscultation bilaterally, no rales or wheezing, normal effort without accessory muscle use. Abdomen: Bowel sounds are present. Abdomen is soft, nontender, nondistended, no masses or organomegaly. Negative Murphy's, Rovsing's, McBurney's, and Carnett's sign. Pain was more over R abd wall over muscle (oblique?) Psych: Age appropriate judgment and insight. Normal mood and affect.  Urinary frequency  Abdominal wall pain - Plan: traMADol (ULTRAM) 50 MG tablet, US Abdomen Limited RUQ,  Fecal occult blood, imunochemical  Hematuria, unspecified type  Orders as above. Will culture. Tx based off of results.  UA neg but did show some blood. I see this has been present in past UAs as well. Will rule out infection through culture and have her come in with PCP for possible hematuria work up vs f/u pain. Keep a symptom diary. RUQ [place] given post prandial symptoms. Could be msk in etiology, but the drinking cause pain doesn't fit.  F/u in 1-2 weeks with Dr Lorelei Pont.  Pt voiced understanding and agreement to the plan.  [place], DO 09/09/16 1:46 PM

## 2016-09-09 NOTE — Progress Notes (Signed)
Pre visit review using our clinic review tool, if applicable. No additional management support is needed unless otherwise documented below in the visit note. 

## 2016-09-10 NOTE — Addendum Note (Signed)
Addended by: Harl Bowie on: 09/10/2016 07:11 AM   Modules accepted: Orders

## 2016-09-10 NOTE — Addendum Note (Signed)
Addended by: Harl Bowie on: 09/10/2016 02:50 PM   Modules accepted: Orders

## 2016-09-11 LAB — URINALYSIS, MICROSCOPIC ONLY
Bacteria, UA: NONE SEEN [HPF]
Casts: NONE SEEN [LPF]
Crystals: NONE SEEN [HPF]
RBC / HPF: NONE SEEN RBC/HPF (ref <=2)
Squamous Epithelial / LPF: NONE SEEN [HPF] (ref <=5)
WBC, UA: NONE SEEN WBC/HPF (ref <=5)
YEAST: NONE SEEN [HPF]

## 2016-09-11 LAB — URINE CULTURE: ORGANISM ID, BACTERIA: NO GROWTH

## 2016-09-13 ENCOUNTER — Ambulatory Visit: Payer: Medicare Other | Admitting: Family Medicine

## 2016-09-14 ENCOUNTER — Ambulatory Visit (HOSPITAL_BASED_OUTPATIENT_CLINIC_OR_DEPARTMENT_OTHER)
Admission: RE | Admit: 2016-09-14 | Discharge: 2016-09-14 | Disposition: A | Discharge status: Home / Self Care | Payer: Medicare Other | Source: Ambulatory Visit | Attending: Family Medicine | Admitting: Family Medicine

## 2016-09-14 ENCOUNTER — Other Ambulatory Visit: Payer: Self-pay | Admitting: Emergency Medicine

## 2016-09-14 DIAGNOSIS — K760 Fatty (change of) liver, not elsewhere classified: Secondary | ICD-10-CM | POA: Insufficient documentation

## 2016-09-14 DIAGNOSIS — R109 Unspecified abdominal pain: Secondary | ICD-10-CM | POA: Diagnosis present

## 2016-09-14 NOTE — Progress Notes (Addendum)
[place] at [facility] [address] [place], [place] [address] [phone]  Date:  09/15/2016   Name:  Maria [name]   DOB:  03/30/1950   MRN:  [id number]  PCP:  [name], MD    Chief Complaint: Follow-up (Pt here for 1 week f/u . Pt states pain is worse. )  myoglobinuria or hemoglobinuria  History of Present Illness:  Maria [name] is a 66 y.o. very pleasant female patient who presents with the following:  Here today to follow-up possible microhematuria- see recent note per Dr. Nani Ravens She has been noted on a couple of occasions to have heme on her dipstick, but negative micro exam.  The only positive micro I can see is from 2014 where she was noted to have 1-8 RBC per field.  She was here about one week ago with abdominal pain - thought likely MSK in origin.  Given tramadol to use prn.   She had a RUQ [place] yesterday which was reassuring, showed fatty liver only  Today notes that she has been having belly pain for the last 3 weeks or so.  She feels like "my whole stomach is swollen."  Sx will wax and wane but never go away  No nausea or vomiting She has not seen any blood in her stool Her stool does seem lighter in color but otherwise no change in her bowel habits No fever or chills noted  She has noted urinary frequency No vaginal symptoms  Wt Readings from Last 3 Encounters:  09/15/16 187 lb 6.4 oz (85 kg)  09/09/16 186 lb 8 oz (84.6 kg)  08/25/16 184 lb 12.8 oz (83.8 kg)   History of pre-diabetes, diverticulitis.  This does not seem like diverticulitis pain she had has in the past   Patient Active Problem List   Diagnosis Date Noted  . Prediabetes 10/13/2015  . Reflux esophagitis 11/20/2014  . NONSPECIFIC ABN FINDING RAD & OTH EXAM GI TRACT 09/19/2009    Past Medical History:  Diagnosis Date  . Arthritis   . Colon polyps   . Diverticulosis   . GERD (gastroesophageal reflux disease)   .  Hypertension     Past Surgical History:  Procedure Laterality Date  . ESOPHAGEAL MANOMETRY N/A 12/13/2014   Procedure: ESOPHAGEAL MANOMETRY (EM);  Surgeon: Carol Ada, MD;  Location: [facility];  Service: Endoscopy;  Laterality: N/A;  . SPINE SURGERY     Neck    Social History  Substance Use Topics  . Smoking status: Never Smoker  . Smokeless tobacco: Never Used  . Alcohol use No    Family History  Problem Relation Age of Onset  . Arthritis Brother     No Known Allergies  Medication list has been reviewed and updated.  Current Outpatient Prescriptions on File Prior to Visit  Medication Sig Dispense Refill  . albuterol (PROVENTIL HFA;VENTOLIN HFA) 108 (90 Base) MCG/ACT inhaler Inhale 2 puffs into the lungs every 6 (six) hours as needed for wheezing or shortness of breath. 1 Inhaler 0  . b complex vitamins tablet Take 1 tablet by mouth daily.    . celecoxib (CELEBREX) 100 MG capsule Take 1 capsule (100 mg total) by mouth 2 (two) times daily. Use as needed for arthritis pain 60 capsule 6  . montelukast (SINGULAIR) 10 MG tablet Take 1 tablet (10 mg total) by mouth at bedtime. 30 tablet 0  . Multiple Vitamin (MULTIVITAMIN) tablet Take 1  tablet by mouth daily.    Marland Kitchen omeprazole (PRILOSEC) 20 MG capsule Take 20 mg by mouth daily.    . traMADol (ULTRAM) 50 MG tablet Take 1 tablet (50 mg total) by mouth every 12 (twelve) hours as needed. 15 tablet 0   No current facility-administered medications on file prior to visit.     Review of Systems:  As per HPI- otherwise negative. No fever or chills   Physical Examination: Vitals:   09/15/16 1107 09/15/16 1112  BP: (!) 147/75 (!) 151/70  Pulse: 63   Temp: 97.6 F (36.4 C)    Vitals:   09/15/16 1107  Weight: 187 lb 6.4 oz (85 kg)  Height: 5\' 6"  (1.676 m)   Body mass index is 30.25 kg/m. Ideal Body Weight: Weight in (lb) to have BMI = 25: 154.6  GEN: WDWN, NAD, Non-toxic, A & O x 3, overweight, looks well HEENT:  Atraumatic, Normocephalic. Neck supple. No masses, No LAD.  Bilateral TM wnl, oropharynx normal.  PEERL,EOMI.   Ears and Nose: No external deformity. CV: RRR, No M/G/R. No JVD. No thrill. No extra heart sounds. PULM: CTA B, no wheezes, crackles, rhonchi. No retractions. No resp. distress. No accessory muscle use. ABD: S,  ND, +BS. No rebound. No HSM. EXTR: No c/c/e NEURO Normal gait.  PSYCH: Normally interactive. Conversant. Not depressed or anxious appearing.  Calm demeanor.  Pt notes mild, diffuse abd pain, most over the RLQ but still mild in this quadrant   Assessment and Plan: Right lower quadrant pain - Plan: CBC, Comprehensive metabolic panel, Lipase, Urine Culture, POCT urinalysis dipstick, Urine Microscopic Only  Abnormal urinalysis  Here today with RLQ pain for about 3 weeks and concern about microhematuria Would like to obtain CT scan for persistent abdominal pain, but first will have to get her renal function.  Recommended stat CMP followed by CT later today.  However pt has class this evening and prefers to do her CT tomorrow.  Given her mild sx of 3 weeks duration I agree that this is reasonable, but she will seek care if any worsening overnight   Received normal renal function and ordered CT scan.  Will follow-up with pt pending this result  Signed Lamar Blinks, MD  Results for orders placed or performed in visit on 09/15/16  CBC  Result Value Ref Range   WBC 7.6 4.0 - 10.5 K/uL   RBC 4.40 3.87 - 5.11 Mil/uL   Platelets 267.0 150.0 - 400.0 K/uL   Hemoglobin 13.4 12.0 - 15.0 g/dL   HCT 39.9 36.0 - 46.0 %   MCV 90.5 78.0 - 100.0 fl   MCHC 33.6 30.0 - 36.0 g/dL   RDW 12.6 11.5 - 15.5 %  Comprehensive metabolic panel  Result Value Ref Range   Sodium 137 135 - 145 mEq/L   Potassium 3.7 3.5 - 5.1 mEq/L   Chloride 101 96 - 112 mEq/L   CO2 31 19 - 32 mEq/L   Glucose, Bld 114 (H) 70 - 99 mg/dL   BUN 14 6 - 23 mg/dL   Creatinine, Ser 0.90 0.40 - 1.20 mg/dL   Total  Bilirubin 0.4 0.2 - 1.2 mg/dL   Alkaline Phosphatase 102 39 - 117 U/L   AST 18 0 - 37 U/L   ALT 15 0 - 35 U/L   Total Protein 7.2 6.0 - 8.3 g/dL   Albumin 4.1 3.5 - 5.2 g/dL   Calcium 9.8 8.4 - 10.5 mg/dL   GFR 66.42 >60.00 mL/min  Lipase  Result Value Ref Range   Lipase 17.0 11.0 - 59.0 U/L  Urine Microscopic Only  Result Value Ref Range   WBC, UA none seen 0-2/hpf   RBC / HPF none seen 0-2/hpf    No RBCs seen on urine micro today.  Did not get a dipstick today Her last urine on 6/22 had a positive dip for blood but no RBCs on micro In 7/17 she had  "moderate" blood on dip but no micro done  Obtained CT of her abd/ pelvis on 6/29  CT ABDOMEN AND PELVIS WITH CONTRAST  TECHNIQUE: Multidetector CT imaging of the abdomen and pelvis was performed using the standard protocol following bolus administration of intravenous contrast.  CONTRAST:  139mL ISOVUE-300 IOPAMIDOL (ISOVUE-300) INJECTION 61%  COMPARISON:  05/11/2012  FINDINGS: Lower chest: Normal.  Hepatobiliary: No focal liver abnormality is seen. No gallstones, gallbladder wall thickening, or biliary dilatation.  Pancreas: Unremarkable. No pancreatic ductal dilatation or surrounding inflammatory changes.  Spleen: Normal in size without focal abnormality.  Adrenals/Urinary Tract: Adrenal glands are unremarkable. Kidneys are normal, without renal calculi, focal lesion, or hydronephrosis. Bladder is unremarkable.  Stomach/Bowel: Stomach is within normal limits. Appendix appears normal. No evidence of bowel wall thickening, distention, or inflammatory changes. Numerous diverticula throughout the colon.  Vascular/Lymphatic: No significant vascular findings are present. No enlarged abdominal or pelvic lymph nodes. Reproductive: Uterus and bilateral adnexa are unremarkable. Other: No abdominal wall hernia or abnormality. No abdominopelvic ascites. Musculoskeletal: No acute findings. Degenerative disc  and joint disease in the lower lumbar spine. IMPRESSION: No acute abnormalities. Diverticulosis of the colon. 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  Let pt know that her CT was negative.  We decided to have her see GI as our next step in her evaluation Will also offer to have her see urology for possible microhematuria.  They may want to do a cysto to complete eval.  Ct already done as above

## 2016-09-15 ENCOUNTER — Ambulatory Visit (INDEPENDENT_AMBULATORY_CARE_PROVIDER_SITE_OTHER): Payer: Medicare Other | Admitting: Family Medicine

## 2016-09-15 ENCOUNTER — Other Ambulatory Visit (INDEPENDENT_AMBULATORY_CARE_PROVIDER_SITE_OTHER): Payer: Medicare Other

## 2016-09-15 VITALS — BP 151/70 | HR 63 | Temp 97.6°F | Ht 66.0 in | Wt 187.4 lb

## 2016-09-15 DIAGNOSIS — R8290 Unspecified abnormal findings in urine: Secondary | ICD-10-CM | POA: Diagnosis not present

## 2016-09-15 DIAGNOSIS — R1031 Right lower quadrant pain: Secondary | ICD-10-CM | POA: Diagnosis not present

## 2016-09-15 DIAGNOSIS — R109 Unspecified abdominal pain: Secondary | ICD-10-CM

## 2016-09-15 LAB — COMPREHENSIVE METABOLIC PANEL
ALBUMIN: 4.1 g/dL (ref 3.5–5.2)
ALK PHOS: 102 U/L (ref 39–117)
ALT: 15 U/L (ref 0–35)
AST: 18 U/L (ref 0–37)
BUN: 14 mg/dL (ref 6–23)
CALCIUM: 9.8 mg/dL (ref 8.4–10.5)
CO2: 31 mEq/L (ref 19–32)
CREATININE: 0.9 mg/dL (ref 0.40–1.20)
Chloride: 101 mEq/L (ref 96–112)
GFR: 66.42 mL/min (ref >60.00)
Glucose, Bld: 114 mg/dL — ABNORMAL HIGH (ref 70–99)
Potassium: 3.7 mEq/L (ref 3.5–5.1)
SODIUM: 137 meq/L (ref 135–145)
TOTAL PROTEIN: 7.2 g/dL (ref 6.0–8.3)
Total Bilirubin: 0.4 mg/dL (ref 0.2–1.2)

## 2016-09-15 LAB — FECAL OCCULT BLOOD, IMMUNOCHEMICAL: FECAL OCCULT BLD: NEGATIVE

## 2016-09-15 LAB — CBC
HEMATOCRIT: 39.9 % (ref 36.0–46.0)
Hemoglobin: 13.4 g/dL (ref 12.0–15.0)
MCHC: 33.6 g/dL (ref 30.0–36.0)
MCV: 90.5 fl (ref 78.0–100.0)
PLATELETS: 267 10*3/uL (ref 150.0–400.0)
RBC: 4.4 Mil/uL (ref 3.87–5.11)
RDW: 12.6 % (ref 11.5–15.5)
WBC: 7.6 10*3/uL (ref 4.0–10.5)

## 2016-09-15 LAB — URINALYSIS, MICROSCOPIC ONLY
RBC / HPF: NONE SEEN (ref 0–?)
WBC UA: NONE SEEN (ref 0–?)

## 2016-09-15 LAB — LIPASE: LIPASE: 17 U/L (ref 11.0–59.0)

## 2016-09-15 NOTE — Patient Instructions (Signed)
It was good to see you today- I will be in touch once your labs come in.  Assuming your kidney function is ok we will go ahead with a CT can for you

## 2016-09-16 LAB — URINE CULTURE: Organism ID, Bacteria: NO GROWTH

## 2016-09-17 ENCOUNTER — Encounter (HOSPITAL_BASED_OUTPATIENT_CLINIC_OR_DEPARTMENT_OTHER): Payer: Self-pay

## 2016-09-17 ENCOUNTER — Ambulatory Visit (HOSPITAL_BASED_OUTPATIENT_CLINIC_OR_DEPARTMENT_OTHER)
Admission: RE | Admit: 2016-09-17 | Discharge: 2016-09-17 | Disposition: A | Discharge status: Home / Self Care | Payer: Medicare Other | Source: Ambulatory Visit | Attending: Family Medicine | Admitting: Family Medicine

## 2016-09-17 ENCOUNTER — Encounter: Payer: Self-pay | Admitting: Family Medicine

## 2016-09-17 ENCOUNTER — Ambulatory Visit: Payer: Medicare Other | Admitting: Podiatry

## 2016-09-17 DIAGNOSIS — R8290 Unspecified abnormal findings in urine: Secondary | ICD-10-CM | POA: Diagnosis present

## 2016-09-17 DIAGNOSIS — R1031 Right lower quadrant pain: Principal | ICD-10-CM

## 2016-09-17 DIAGNOSIS — K5730 Diverticulosis of large intestine without perforation or abscess without bleeding: Secondary | ICD-10-CM | POA: Diagnosis not present

## 2016-09-17 DIAGNOSIS — M47816 Spondylosis without myelopathy or radiculopathy, lumbar region: Secondary | ICD-10-CM | POA: Diagnosis not present

## 2016-09-17 DIAGNOSIS — G8929 Other chronic pain: Secondary | ICD-10-CM

## 2016-09-17 DIAGNOSIS — M5136 Other intervertebral disc degeneration, lumbar region: Secondary | ICD-10-CM | POA: Diagnosis not present

## 2016-09-17 MED ORDER — IOPAMIDOL (ISOVUE-300) INJECTION 61%
100.0000 mL | Freq: Once | INTRAVENOUS | Status: AC | PRN
  Administered 2016-09-17: 100 mL via INTRAVENOUS

## 2016-09-18 NOTE — Addendum Note (Signed)
Addended by: Lamar Blinks C on: 09/18/2016 02:01 PM   Modules accepted: Orders

## 2016-09-29 ENCOUNTER — Encounter: Payer: Self-pay | Admitting: Family Medicine

## 2016-09-29 DIAGNOSIS — R3129 Other microscopic hematuria: Secondary | ICD-10-CM

## 2016-10-01 ENCOUNTER — Inpatient Hospital Stay (HOSPITAL_COMMUNITY)
Admission: AD | Admit: 2016-10-01 | Discharge: 2016-10-01 | Disposition: A | Discharge status: Home / Self Care | Payer: Medicare Other | Source: Ambulatory Visit | Attending: Obstetrics & Gynecology | Admitting: Obstetrics & Gynecology

## 2016-10-01 ENCOUNTER — Telehealth: Payer: Self-pay | Admitting: Gastroenterology

## 2016-10-01 ENCOUNTER — Encounter (HOSPITAL_COMMUNITY): Payer: Self-pay

## 2016-10-01 DIAGNOSIS — Z86010 Personal history of colonic polyps: Secondary | ICD-10-CM | POA: Insufficient documentation

## 2016-10-01 DIAGNOSIS — R109 Unspecified abdominal pain: Secondary | ICD-10-CM | POA: Diagnosis not present

## 2016-10-01 DIAGNOSIS — R1031 Right lower quadrant pain: Secondary | ICD-10-CM | POA: Insufficient documentation

## 2016-10-01 LAB — URINALYSIS, ROUTINE W REFLEX MICROSCOPIC
Bilirubin Urine: NEGATIVE
Glucose, UA: NEGATIVE mg/dL
Ketones, ur: NEGATIVE mg/dL
Leukocytes, UA: NEGATIVE
Nitrite: NEGATIVE
PROTEIN: NEGATIVE mg/dL
Specific Gravity, Urine: 1.004 — ABNORMAL LOW (ref 1.005–1.030)
pH: 5 (ref 5.0–8.0)

## 2016-10-01 LAB — CBC WITH DIFFERENTIAL/PLATELET
BASOS PCT: 0 %
Basophils Absolute: 0 10*3/uL (ref 0.0–0.1)
EOS ABS: 0.2 10*3/uL (ref 0.0–0.7)
Eosinophils Relative: 2 %
HCT: 40.7 % (ref 36.0–46.0)
Hemoglobin: 14.1 g/dL (ref 12.0–15.0)
Lymphocytes Relative: 33 %
Lymphs Abs: 2.8 10*3/uL (ref 0.7–4.0)
MCH: 30.7 pg (ref 26.0–34.0)
MCHC: 34.6 g/dL (ref 30.0–36.0)
MCV: 88.5 fL (ref 78.0–100.0)
MONO ABS: 0.4 10*3/uL (ref 0.1–1.0)
MONOS PCT: 5 %
Neutro Abs: 5.1 10*3/uL (ref 1.7–7.7)
Neutrophils Relative %: 60 %
PLATELETS: 297 10*3/uL (ref 150–400)
RBC: 4.6 MIL/uL (ref 3.87–5.11)
RDW: 12.5 % (ref 11.5–15.5)
WBC: 8.6 10*3/uL (ref 4.0–10.5)

## 2016-10-01 LAB — COMPREHENSIVE METABOLIC PANEL
ALBUMIN: 4.2 g/dL (ref 3.5–5.0)
ALK PHOS: 100 U/L (ref 38–126)
ALT: 19 U/L (ref 14–54)
AST: 25 U/L (ref 15–41)
Anion gap: 10 (ref 5–15)
BILIRUBIN TOTAL: 0.6 mg/dL (ref 0.3–1.2)
BUN: 15 mg/dL (ref 6–20)
CALCIUM: 9.6 mg/dL (ref 8.9–10.3)
CO2: 23 mmol/L (ref 22–32)
CREATININE: 1.02 mg/dL — AB (ref 0.44–1.00)
Chloride: 101 mmol/L (ref 101–111)
GFR calc non Af Amer: 56 mL/min — ABNORMAL LOW (ref >60)
GLUCOSE: 94 mg/dL (ref 65–99)
Potassium: 4 mmol/L (ref 3.5–5.1)
SODIUM: 134 mmol/L — AB (ref 135–145)
Total Protein: 7.8 g/dL (ref 6.5–8.1)

## 2016-10-01 NOTE — MAU Provider Note (Signed)
History     CSN: [id number]  Arrival date and time: 10/01/16 1630   First Provider Initiated Contact with Patient 10/01/16 1732      Chief Complaint  Patient presents with  . Abdominal Pain   HPI  [name] is a 66 yo non-pregnant female presenting to MAU with complaints of persistent RLQ pain that radiates to RUQ.  She was evaluated 3 wks ago for the same complaints.  She also has seen her PCP 2 wks ago for the same complaints.  She had lab work and a CT scan; all of which were WNL.  She reports that she was told if her pain persisted to seek medical care at an [facility]; so she presents to MAU unaware that she is in the wrong location.  Past Medical History:  Diagnosis Date  . Arthritis   . Colon polyps   . Diverticulosis   . GERD (gastroesophageal reflux disease)   . Hypertension     Past Surgical History:  Procedure Laterality Date  . ESOPHAGEAL MANOMETRY N/A 12/13/2014   Procedure: ESOPHAGEAL MANOMETRY (EM);  Surgeon: Carol Ada, MD;  Location: [facility];  Service: Endoscopy;  Laterality: N/A;  . SPINE SURGERY     Neck    Family History  Problem Relation Age of Onset  . Arthritis Brother     Social History  Substance Use Topics  . Smoking status: Never Smoker  . Smokeless tobacco: Never Used  . Alcohol use No    Allergies: No Known Allergies  Prescriptions Prior to Admission  Medication Sig Dispense Refill Last Dose  . albuterol (PROVENTIL HFA;VENTOLIN HFA) 108 (90 Base) MCG/ACT inhaler Inhale 2 puffs into the lungs every 6 (six) hours as needed for wheezing or shortness of breath. 1 Inhaler 0 Taking  . b complex vitamins tablet Take 1 tablet by mouth daily.   Taking  . celecoxib (CELEBREX) 100 MG capsule Take 1 capsule (100 mg total) by mouth 2 (two) times daily. Use as needed for arthritis pain 60 capsule 6 Taking  . montelukast (SINGULAIR) 10 MG tablet Take 1 tablet (10 mg total) by mouth at bedtime. 30 tablet 0 Taking  . Multiple Vitamin  (MULTIVITAMIN) tablet Take 1 tablet by mouth daily.   Taking  . omeprazole (PRILOSEC) 20 MG capsule Take 20 mg by mouth daily.   Taking  . traMADol (ULTRAM) 50 MG tablet Take 1 tablet (50 mg total) by mouth every 12 (twelve) hours as needed. 15 tablet 0 Taking    Review of Systems  Constitutional: Negative.   HENT: Negative.   Eyes: Negative.   Respiratory: Negative.   Cardiovascular: Negative.   Gastrointestinal: Positive for abdominal distention (after drinking orange juice) and abdominal pain.  Endocrine: Negative.   Genitourinary: Negative.   Musculoskeletal: Negative.   Skin: Negative.   Allergic/Immunologic: Negative.   Neurological: Negative.   Hematological: Negative.   Psychiatric/Behavioral: Negative.    Physical Exam   Blood pressure 133/74, pulse 75, temperature 97.7 F (36.5 C), temperature source Oral, resp. rate 18.  Physical Exam  Constitutional: She is oriented to person, place, and time. She appears well-developed and well-nourished.  HENT:  Head: Normocephalic.  Eyes: Pupils are equal, round, and reactive to light.  Neck: Normal range of motion.  Cardiovascular: Normal rate.   Respiratory: Effort normal.  GI: Soft. She exhibits no distension. There is no tenderness. There is no rebound.  Genitourinary:  Genitourinary Comments: Pelvic deferred  Musculoskeletal: Normal range of motion.  Neurological: She  is alert and oriented to person, place, and time.  Skin: Skin is warm.  Psychiatric: She has a normal mood and affect. Her behavior is normal. Judgment and thought content normal.    MAU Course  Procedures  MDM CCUA CBC with Diff CMP *Consult with Dr. Elonda Husky - all lab results are WNL, should seek medical care at [facility] or [facility]  Results for orders placed or performed during the hospital encounter of 10/01/16 (from the past 24 hour(s))  Urinalysis, Routine w reflex microscopic     Status: Abnormal   Collection Time: 10/01/16  4:30 PM  Result Value Ref  Range   Color, Urine STRAW (A) YELLOW   APPearance CLEAR CLEAR   Specific Gravity, Urine 1.004 (L) 1.005 - 1.030   pH 5.0 5.0 - 8.0   Glucose, UA NEGATIVE NEGATIVE mg/dL   Hgb urine dipstick SMALL (A) NEGATIVE   Bilirubin Urine NEGATIVE NEGATIVE   Ketones, ur NEGATIVE NEGATIVE mg/dL   Protein, ur NEGATIVE NEGATIVE mg/dL   Nitrite NEGATIVE NEGATIVE   Leukocytes, UA NEGATIVE NEGATIVE   RBC / HPF 0-5 0 - 5 RBC/hpf   WBC, UA 0-5 0 - 5 WBC/hpf   Bacteria, UA RARE (A) NONE SEEN   Squamous Epithelial / LPF 0-5 (A) NONE SEEN  Comprehensive metabolic panel     Status: Abnormal   Collection Time: 10/01/16  4:55 PM  Result Value Ref Range   Sodium 134 (L) 135 - 145 mmol/L   Potassium 4.0 3.5 - 5.1 mmol/L   Chloride 101 101 - 111 mmol/L   CO2 23 22 - 32 mmol/L   Glucose, Bld 94 65 - 99 mg/dL   BUN 15 6 - 20 mg/dL   Creatinine, Ser 1.02 (H) 0.44 - 1.00 mg/dL   Calcium 9.6 8.9 - 10.3 mg/dL   Total Protein 7.8 6.5 - 8.1 g/dL   Albumin 4.2 3.5 - 5.0 g/dL   AST 25 15 - 41 U/L   ALT 19 14 - 54 U/L   Alkaline Phosphatase 100 38 - 126 U/L   Total Bilirubin 0.6 0.3 - 1.2 mg/dL   GFR calc non Af Amer 56 (L) >60 mL/min   GFR calc Af Amer >60 >60 mL/min   Anion gap 10 5 - 15  CBC with Differential/Platelet     Status: None   Collection Time: 10/01/16  4:55 PM  Result Value Ref Range   WBC 8.6 4.0 - 10.5 K/uL   RBC 4.60 3.87 - 5.11 MIL/uL   Hemoglobin 14.1 12.0 - 15.0 g/dL   HCT 40.7 36.0 - 46.0 %   MCV 88.5 78.0 - 100.0 fL   MCH 30.7 26.0 - 34.0 pg   MCHC 34.6 30.0 - 36.0 g/dL   RDW 12.5 11.5 - 15.5 %   Platelets 297 150 - 400 K/uL   Neutrophils Relative % 60 %   Neutro Abs 5.1 1.7 - 7.7 K/uL   Lymphocytes Relative 33 %   Lymphs Abs 2.8 0.7 - 4.0 K/uL   Monocytes Relative 5 %   Monocytes Absolute 0.4 0.1 - 1.0 K/uL   Eosinophils Relative 2 %   Eosinophils Absolute 0.2 0.0 - 0.7 K/uL   Basophils Relative 0 %   Basophils Absolute 0.0 0.0 - 0.1 K/uL    Assessment and Plan   Right-sided abdominal pain of unknown cause - Reassurance given that lab work is stable - Advised to seek care at [facility] or [facility] - directions given to [facility] per pt request -  Advised to schedule a F/U appt with PCP for further work-up - Schedule appt with GI provider  Discharge home Patient verbalized an understanding of the plan of care and agrees.   Laury Deep, MSN, CNM 10/01/2016, 7:18 PM

## 2016-10-01 NOTE — Discharge Instructions (Signed)
Abdominal Bloating °When you have abdominal bloating, your abdomen may feel full, tight, or painful. It may also look bigger than normal or swollen (distended). Common causes of abdominal bloating include: °· Swallowing air. °· Constipation. °· Problems digesting food. °· Eating too much. °· Irritable bowel syndrome. This is a condition that affects the large intestine. °· Lactose intolerance. This is an inability to digest lactose, a natural sugar in dairy products. °· Celiac disease. This is a condition that affects the ability to digest gluten, a protein found in some grains. °· Gastroparesis. This is a condition that slows down the movement of food in the stomach and small intestine. It is more common in people with diabetes mellitus. °· Gastroesophageal reflux disease (GERD). This is a digestive condition that makes stomach acid flow back into the esophagus. °· Urinary retention. This means that the body is holding onto urine, and the bladder cannot be emptied all the way. ° °Follow these instructions at home: °Eating and drinking °· Avoid eating too much. °· Try not to swallow air while talking or eating. °· Avoid eating while lying down. °· Avoid these foods and drinks: °? Foods that cause gas, such as broccoli, cabbage, cauliflower, and baked beans. °? Carbonated drinks. °? Hard candy. °? Chewing gum. °Medicines °· Take over-the-counter and prescription medicines only as told by your health care provider. °· Take probiotic medicines. These medicines contain live bacteria or yeasts that can help digestion. °· Take coated peppermint oil capsules. °Activity °· Try to exercise regularly. Exercise may help to relieve bloating that is caused by gas and relieve constipation. °General instructions °· Keep all follow-up visits as told by your health care provider. This is important. °Contact a health care provider if: °· You have nausea and vomiting. °· You have diarrhea. °· You have abdominal pain. °· You have  unusual weight loss or weight gain. °· You have severe pain, and medicines do not help. °Get help right away if: °· You have severe chest pain. °· You have trouble breathing. °· You have shortness of breath. °· You have trouble urinating. °· You have darker urine than normal. °· You have blood in your stools or have dark, tarry stools. °Summary °· Abdominal bloating means that the abdomen is swollen. °· Common causes of abdominal bloating are swallowing air, constipation, and problems digesting food. °· Avoid eating too much and avoid swallowing air. °· Avoid foods that cause gas, carbonated drinks, hard candy, and chewing gum. °This information is not intended to replace advice given to you by your health care provider. Make sure you discuss any questions you have with your health care provider. °Document Released: 04/09/2016 Document Revised: 04/09/2016 Document Reviewed: 04/09/2016 °Elsevier Interactive Patient Education © 2018 Elsevier Inc. ° ° °Abdominal Pain, Adult °Abdominal pain can be caused by many things. Often, abdominal pain is not serious and it gets better with no treatment or by being treated at home. However, sometimes abdominal pain is serious. Your health care provider will do a medical history and a physical exam to try to determine the cause of your abdominal pain. °Follow these instructions at home: °· Take over-the-counter and prescription medicines only as told by your health care provider. Do not take a laxative unless told by your health care provider. °· Drink enough fluid to keep your urine clear or pale yellow. °· Watch your condition for any changes. °· Keep all follow-up visits as told by your health care provider. This is important. °Contact a health   care provider if:  Your abdominal pain changes or gets worse.  You are not hungry or you lose weight without trying.  You are constipated or have diarrhea for more than 2-3 days.  You have pain when you urinate or have a bowel  movement.  Your abdominal pain wakes you up at night.  Your pain gets worse with meals, after eating, or with certain foods.  You are throwing up and cannot keep anything down.  You have a fever. Get help right away if:  Your pain does not go away as soon as your health care provider told you to expect.  You cannot stop throwing up.  Your pain is only in areas of the abdomen, such as the right side or the left lower portion of the abdomen.  You have bloody or black stools, or stools that look like tar.  You have severe pain, cramping, or bloating in your abdomen.  You have signs of dehydration, such as: ? Dark urine, very little urine, or no urine. ? Cracked lips. ? Dry mouth. ? Sunken eyes. ? Sleepiness. ? Weakness. This information is not intended to replace advice given to you by your health care provider. Make sure you discuss any questions you have with your health care provider. Document Released: 12/16/2004 Document Revised: 09/26/2015 Document Reviewed: 08/20/2015 Elsevier Interactive Patient Education  2017 [employer].

## 2016-10-01 NOTE — MAU Note (Addendum)
Pt. States that she has had lower right quadrant abdominal pain x 2 weeks. Pt has had CT scan and lab work done for same issue last week by PCP, all results per pt were normal. Pt. Presents today with persistent pain.

## 2016-10-01 NOTE — Telephone Encounter (Signed)
Received records from Dr. Ulyses Amor office and placed on Dr. Ardis Hughs desk.  Patient is not happy with Dr. Ulyses Amor office and would like to come back to [facility] and see Dr. Havery Moros.  Dr. Eugenia Pancoast would you accept the switch and Dr. Havery Moros would you accept patient?

## 2016-10-12 ENCOUNTER — Encounter: Payer: Self-pay | Admitting: Gastroenterology

## 2016-10-12 NOTE — Telephone Encounter (Signed)
Dr. Ardis Hughs and Dr. Havery Moros both agreed that patient could switch to Dr. Havery Moros.  Dr. Havery Moros would like patient scheduled for a office visit.  LM on Vmail to call back to schedule

## 2016-10-21 ENCOUNTER — Ambulatory Visit: Payer: Medicare Other | Admitting: Podiatry

## 2016-11-04 ENCOUNTER — Ambulatory Visit: Payer: Medicare Other

## 2016-11-04 ENCOUNTER — Ambulatory Visit (INDEPENDENT_AMBULATORY_CARE_PROVIDER_SITE_OTHER): Payer: Medicare Other | Admitting: Podiatry

## 2016-11-04 ENCOUNTER — Ambulatory Visit (INDEPENDENT_AMBULATORY_CARE_PROVIDER_SITE_OTHER): Payer: Medicare Other

## 2016-11-04 ENCOUNTER — Encounter: Payer: Self-pay | Admitting: Podiatry

## 2016-11-04 DIAGNOSIS — M2011 Hallux valgus (acquired), right foot: Secondary | ICD-10-CM

## 2016-11-04 DIAGNOSIS — M7741 Metatarsalgia, right foot: Secondary | ICD-10-CM | POA: Diagnosis not present

## 2016-11-04 DIAGNOSIS — M79672 Pain in left foot: Secondary | ICD-10-CM

## 2016-11-04 DIAGNOSIS — M7742 Metatarsalgia, left foot: Secondary | ICD-10-CM

## 2016-11-04 DIAGNOSIS — M79671 Pain in right foot: Secondary | ICD-10-CM | POA: Diagnosis not present

## 2016-11-04 MED ORDER — MELOXICAM 7.5 MG PO TABS: 7.5000 mg | ORAL_TABLET | Freq: Every day | ORAL | 0 refills | Status: DC

## 2016-11-04 NOTE — Progress Notes (Signed)
   Subjective:    Patient ID: Maria [name], female    DOB: [birth year], 66 y.o.   MRN: [id number]  HPI  Chief Complaint  Patient presents with  . Foot Pain    B/L forefoot pain x 7 months. right worse than left.    66 year old female presents the office today for concerns of pain to both of her feet with the right side worse than left. His been ongoing for about 7 months. She points along the submetatarsal area which she gets pain which she cannot localize her pain today. She states that she has not had any numbness or tingling she denies any recent injury or trauma. She said no treatment for it. She states that she has been careful with walking to make sure is not can hurt her foot. She has no other concerns today.    Review of Systems  Gastrointestinal:       Bloating   All other systems reviewed and are negative.      Objective:   Physical Exam General: AAO x3, NAD  Dermatological: Skin is warm, dry and supple bilateral. Nails x 10 are well manicured; remaining integument appears unremarkable at this time. There are no open sores, no preulcerative lesions, no rash or signs of infection present.  Vascular: Dorsalis Pedis artery and Posterior Tibial artery pedal pulses are 2/4 bilateral with immedate capillary fill time.There is no pain with calf compression, swelling, warmth, erythema.   Neruologic: Grossly intact via light touch bilateral. Protective threshold with Semmes Wienstein monofilament intact to all pedal sites bilateral.   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 Mild discomfort submetatarsal 2/3 on the right foot but there is no specific area pinpoint bony tenderness or pain the vibratory sensation. Mild decrease in present. Range of motion on the right side. Muscular strength 5/5 in all groups tested bilateral.  Gait: Unassisted, Nonantalgic.     Assessment & Plan:  66 year old female with right foot pain likely metatarsalgia/capsulitis -Treatment options  discussed including all alternatives, risks, and complications -Etiology of symptoms were discussed -X-rays were obtained and reviewed with the patient. HAV is present. No evidence of acute fracture. -Dispensed metatarsal offloading pads. -Prescribed mobic. Discussed side effects of the medication and directed to stop if any are to occur and call the office.  -Long term I discussed shoe gear modifications and orthotics  Celesta Gentile, DPM

## 2016-11-14 ENCOUNTER — Encounter (HOSPITAL_COMMUNITY): Payer: Self-pay | Admitting: Emergency Medicine

## 2016-11-14 ENCOUNTER — Emergency Department (HOSPITAL_COMMUNITY)
Admission: EM | Admit: 2016-11-14 | Discharge: 2016-11-14 | Disposition: A | Discharge status: Home / Self Care | Payer: Medicare Other | Attending: Emergency Medicine | Admitting: Emergency Medicine

## 2016-11-14 DIAGNOSIS — I10 Essential (primary) hypertension: Secondary | ICD-10-CM | POA: Diagnosis not present

## 2016-11-14 DIAGNOSIS — Z79899 Other long term (current) drug therapy: Secondary | ICD-10-CM | POA: Insufficient documentation

## 2016-11-14 DIAGNOSIS — L03032 Cellulitis of left toe: Secondary | ICD-10-CM | POA: Diagnosis not present

## 2016-11-14 DIAGNOSIS — M79675 Pain in left toe(s): Secondary | ICD-10-CM | POA: Diagnosis present

## 2016-11-14 MED ORDER — LIDOCAINE HCL 2 % IJ SOLN
10.0000 mL | Freq: Once | INTRAMUSCULAR | Status: AC
  Administered 2016-11-14: 200 mg
  Filled 2016-11-14: qty 10

## 2016-11-14 NOTE — Discharge Instructions (Addendum)
Please soak the toe 2-3 times a day for roughly 20 minutes in warm water. Please clean the toe at least Keep the toe dry when you are not soaking it. Please keep the toe covered with a bandage until the incision heals, usually in the next 5 days.   If you develop any new or worsening symptoms, including fever, chills, or if the skin around the toe swell up or looks like it has more pus collecting under the skin, you can return to the Emergency Department for re-evaluation or follow up with your primary doctor.

## 2016-11-14 NOTE — ED Triage Notes (Signed)
Pt with edema and pus around nail of L 1st toe. Pt does not recall injury to toe.

## 2016-11-14 NOTE — ED Provider Notes (Signed)
[facility] DEPT Provider Note   CSN: [id number] Arrival date & time: 11/14/16  4818     History   Chief Complaint Chief Complaint  Patient presents with  . toe infection    L 1st toe    HPI Maria [name] is a 66 y.o. female who presents to the emergency department with a chief complaint of left great toe pain and swelling that began one week ago and worsened over the last 3 days. She denies recent trauma or injury, but reports that she will pick her finger and toenails from time to time. No fever or chills. No treatment prior to arrival. No history of diabetes.   The history is provided by the patient. No language interpreter was used.    Past Medical History:  Diagnosis Date  . Arthritis   . Colon polyps   . Diverticulosis   . GERD (gastroesophageal reflux disease)   . Hypertension     Patient Active Problem List   Diagnosis Date Noted  . Prediabetes 10/13/2015  . Reflux esophagitis 11/20/2014  . NONSPECIFIC ABN FINDING RAD & OTH EXAM GI TRACT 09/19/2009    Past Surgical History:  Procedure Laterality Date  . ESOPHAGEAL MANOMETRY N/A 12/13/2014   Procedure: ESOPHAGEAL MANOMETRY (EM);  Surgeon: Carol Ada, MD;  Location: [facility];  Service: Endoscopy;  Laterality: N/A;  . SPINE SURGERY     Neck    OB History    Gravida Para Term Preterm AB Living   3 3 3     2    SAB TAB Ectopic Multiple Live Births           2       Home Medications    Prior to Admission medications   Medication Sig Start Date End Date Taking? Authorizing Provider  albuterol (PROVENTIL HFA;VENTOLIN HFA) 108 (90 Base) MCG/ACT inhaler Inhale 2 puffs into the lungs every 6 (six) hours as needed for wheezing or shortness of breath. 09/15/15   Copland, Gay Filler, MD  b complex vitamins tablet Take 1 tablet by mouth daily.    [provider]  celecoxib (CELEBREX) 100 MG capsule Take 1 capsule (100 mg total) by mouth 2 (two) times daily. Use as needed for arthritis pain 08/25/16    Copland, Gay Filler, MD  meloxicam (MOBIC) 7.5 MG tablet Take 1 tablet (7.5 mg total) by mouth daily. 11/04/16   Trula Slade, DPM  montelukast (SINGULAIR) 10 MG tablet Take 1 tablet (10 mg total) by mouth at bedtime. Patient not taking: Reported on 11/04/2016 09/15/15   Copland, Gay Filler, MD  Multiple Vitamin (MULTIVITAMIN) tablet Take 1 tablet by mouth daily.    [provider]  omeprazole (PRILOSEC) 20 MG capsule Take 20 mg by mouth daily.    [provider]  RaNITidine HCl (ZANTAC PO) Take by mouth.    [provider]  traMADol (ULTRAM) 50 MG tablet Take 1 tablet (50 mg total) by mouth every 12 (twelve) hours as needed. Patient not taking: Reported on 11/04/2016 09/09/16   Shelda Pal, DO    Family History Family History  Problem Relation Age of Onset  . Arthritis Brother     Social History Social History  Substance Use Topics  . Smoking status: Never Smoker  . Smokeless tobacco: Never Used  . Alcohol use No     Allergies   Patient has no known allergies.   Review of Systems Review of Systems  Constitutional: Negative for activity change, chills and  fever.  Respiratory: Negative for shortness of breath.   Cardiovascular: Negative for chest pain.  Gastrointestinal: Negative for abdominal pain.  Musculoskeletal: Negative for back pain.  Skin: Positive for wound. Negative for rash.     Physical Exam Updated Vital Signs BP (!) 142/70 (BP Location: Right Arm)   Pulse 67   Temp 98 F (36.7 C) (Oral)   Resp 18   SpO2 99%   Physical Exam  Constitutional: No distress.  HENT:  Head: Normocephalic.  Eyes: Conjunctivae are normal.  Neck: Neck supple.  Cardiovascular: Normal rate and regular rhythm.  Exam reveals no gallop and no friction rub.   No murmur heard. Pulmonary/Chest: Effort normal. No respiratory distress.  Abdominal: Soft. She exhibits no distension.  Neurological: She is alert.  Skin: Skin is warm. No rash  noted.  TTP and pus noted to the paronychium along the left lateral nail fold of the left great toe.   Psychiatric: Her behavior is normal.  Nursing note and vitals reviewed.    ED Treatments / Results  Labs (all labs ordered are listed, but only abnormal results are displayed) Labs Reviewed  AEROBIC CULTURE (SUPERFICIAL SPECIMEN)    EKG  EKG Interpretation None       Radiology No results found.  Procedures Drain paronychia Date/Time: 11/14/2016 11:02 AM Performed by: Sherryle Lis, Ladamien Rammel A Authorized by: Joline Maxcy A  Consent: Verbal consent obtained. Consent given by: patient Patient understanding: patient states understanding of the procedure being performed Patient consent: the patient's understanding of the procedure matches consent given Patient identity confirmed: verbally with patient Local anesthesia used: yes Anesthesia: digital block  Anesthesia: Local anesthesia used: yes Local Anesthetic: lidocaine 2% without epinephrine Anesthetic total: 9 mL  Sedation: Patient sedated: no Patient tolerance: Patient tolerated the procedure well with no immediate complications Comments: A 1 cm incision was made along the lateral aspect of the left great toe. Moderate amount of purulent drainage was expressed. After no more purulent material was expressed, the digit was copiously irrigated, a topical antibiotic and sterile bandage were applied.    (including critical care time)  Medications Ordered in ED Medications  lidocaine (XYLOCAINE) 2 % (with pres) injection 200 mg (200 mg Infiltration Given 11/14/16 1122)     Initial Impression / Assessment and Plan / ED Course  I have reviewed the triage vital signs and the nursing notes.  Pertinent labs & imaging results that were available during my care of the patient were reviewed by me and considered in my medical decision making (see chart for details).     66 year old female with paronychia to the left great toe.  The patient was discussed and evaluated by Dr. Zenia Resides, attending physician.The risks and benefits of the procedure were discussed with the patient, which she understood and was agreeable to.  A 1 cm incision was made along the lateral aspect of the nailbed of the left great toe. A moderate amount of purulent discharge was expressed. The digit was copiously irrigated and wrapped with a sterile bandage after applying a topical antibiotic. The patient has no risks factors for impaired wound healing. Will d/c the patient to home without antibiotics and follow up to her PCP. Strict return precautions given. NAD. VSS. Will d/c the patient to home at this time.   Final Clinical Impressions(s) / ED Diagnoses   Final diagnoses:  Paronychia of great toe, left    New Prescriptions Discharge Medication List as of 11/14/2016 12:44 PM  Joanne Gavel, PA-C 11/15/16 1318    Lacretia Leigh, MD 11/19/16 [phone]

## 2016-11-14 NOTE — ED Provider Notes (Signed)
Medical screening examination/treatment/procedure(s) were conducted as a shared visit with non-physician practitioner(s) and myself.  I personally evaluated the patient during the encounter.   EKG Interpretation None     Patient here with left toe paronychia that was trained by the physician assistant. She tolerated procedure well. Stable for discharge   Lacretia Leigh, MD 11/14/16 1247

## 2016-11-14 NOTE — ED Notes (Signed)
Bed: WTR6 Expected date:  Expected time:  Means of arrival:  Comments: 

## 2016-11-16 LAB — AEROBIC CULTURE  (SUPERFICIAL SPECIMEN): SPECIAL REQUESTS: NORMAL

## 2016-11-16 LAB — AEROBIC CULTURE W GRAM STAIN (SUPERFICIAL SPECIMEN)

## 2016-11-17 ENCOUNTER — Telehealth: Payer: Self-pay | Admitting: Emergency Medicine

## 2016-11-17 NOTE — Telephone Encounter (Signed)
Post ED Visit - Positive Culture Follow-up  Culture report reviewed by antimicrobial stewardship pharmacist:  []  Elenor Quinones, Pharm.D. []  Heide Guile, Pharm.D., BCPS AQ-ID []  Parks Neptune, Pharm.D., BCPS []  Alycia Rossetti, Pharm.D., BCPS []  [place], Pharm.D., BCPS, AAHIVP []  Legrand Como, Pharm.D., BCPS, AAHIVP []  Salome Arnt, PharmD, BCPS []  Dimitri Ped, PharmD, BCPS []  Vincenza Hews, PharmD, BCPS Leeroy Cha PharmD  Positive wound culture Treated with none,  no further patient follow-up is required at this time.  Hazle Nordmann 11/17/2016, 1:28 PM

## 2016-11-17 NOTE — Progress Notes (Signed)
ED Antimicrobial Stewardship Positive Culture Follow Up   Maria [name] is an 66 y.o. female who presented to [facility] on 11/14/2016 with a chief complaint of  Chief Complaint  Patient presents with  . toe infection    L 1st toe    Recent Results (from the past 720 hour(s))  Wound or Superficial Culture     Status: None   Collection Time: 11/14/16 12:27 PM  Result Value Ref Range Status   Specimen Description WOUND LEFT TOE  Final   Special Requests Normal  Final   Gram Stain   Final    FEW WBC PRESENT, PREDOMINANTLY PMN RARE SQUAMOUS EPITHELIAL CELLS PRESENT ABUNDANT GRAM POSITIVE COCCI IN CLUSTERS Performed at [facility], [address], [place], [place] [address]    Culture ABUNDANT STAPHYLOCOCCUS AUREUS  Final   Report Status 11/16/2016 FINAL  Final   Organism ID, Bacteria STAPHYLOCOCCUS AUREUS  Final      Susceptibility   Staphylococcus aureus - MIC*    CIPROFLOXACIN <=0.5 SENSITIVE Sensitive     ERYTHROMYCIN >=8 RESISTANT Resistant     GENTAMICIN <=0.5 SENSITIVE Sensitive     OXACILLIN 0.5 SENSITIVE Sensitive     TETRACYCLINE <=1 SENSITIVE Sensitive     VANCOMYCIN <=0.5 SENSITIVE Sensitive     TRIMETH/SULFA <=10 SENSITIVE Sensitive     CLINDAMYCIN >=8 RESISTANT Resistant     RIFAMPIN <=0.5 SENSITIVE Sensitive     Inducible Clindamycin NEGATIVE Sensitive     * ABUNDANT STAPHYLOCOCCUS AUREUS    Plan: No antibiotics indicated for mild SSTI after I&D.  ED Provider: Glendell Docker, NP   Mila Merry Gerarda Fraction, PharmD PGY1 Pharmacy Resident Pager: [phone]

## 2016-12-06 ENCOUNTER — Encounter: Payer: Self-pay | Admitting: *Deleted

## 2016-12-07 ENCOUNTER — Ambulatory Visit (INDEPENDENT_AMBULATORY_CARE_PROVIDER_SITE_OTHER): Payer: Medicare Other | Admitting: Gastroenterology

## 2016-12-07 ENCOUNTER — Encounter: Payer: Self-pay | Admitting: Gastroenterology

## 2016-12-07 VITALS — BP 128/82 | HR 72 | Ht 66.0 in | Wt 190.0 lb

## 2016-12-07 DIAGNOSIS — K2100 Gastro-esophageal reflux disease with esophagitis, without bleeding: Secondary | ICD-10-CM

## 2016-12-07 DIAGNOSIS — K209 Esophagitis, unspecified without bleeding: Secondary | ICD-10-CM

## 2016-12-07 DIAGNOSIS — R1031 Right lower quadrant pain: Secondary | ICD-10-CM | POA: Insufficient documentation

## 2016-12-07 DIAGNOSIS — K219 Gastro-esophageal reflux disease without esophagitis: Secondary | ICD-10-CM | POA: Insufficient documentation

## 2016-12-07 MED ORDER — OMEPRAZOLE 40 MG PO CPDR: 40.0000 mg | DELAYED_RELEASE_CAPSULE | Freq: Every day | ORAL | 3 refills | Status: DC

## 2016-12-07 NOTE — Patient Instructions (Signed)
You have been scheduled for an endoscopy. Please follow written instructions given to you at your visit today. If you use inhalers (even only as needed), please bring them with you on the day of your procedure. Your physician has requested that you go to www.startemmi.com and enter the access code given to you at your visit today. This web site gives a general overview about your procedure. However, you should still follow specific instructions given to you by our office regarding your preparation for the procedure.  We have sent the following medications to your pharmacy for you to pick up at your convenience: Omeprazole  If you are age 11 or older, your body mass index should be between 23-30. Your Body mass index is 30.67 kg/m. If this is out of the aforementioned range listed, please consider follow up with your Primary Care Provider.  If you are age 38 or younger, your body mass index should be between 19-25. Your Body mass index is 30.67 kg/m. If this is out of the aformentioned range listed, please consider follow up with your Primary Care Provider.

## 2016-12-07 NOTE — Progress Notes (Signed)
HPI :  66 y/o female with a history of GERD / esophagitis, colon polyps, HTN, referred here by Lamar Blinks MD for GERD and abdominal pain.  Longstanding history of reflux, with pyrosis and regurgitation. She has symptoms that are triggered by particular foods. She thinks fried food precipitates reflux. She has been on PPIs in the past which work for her but she does not take anything routinely. She will take zantac PRN which helps. She thinks about twice per week. She thinks she has symptoms routinely. No dysphagia. No FH of esophageal cancer. She has belching which bothers her. Last 2 EGDs in 2014 and 2016 showed LA grade D esophagitis.  She is otherwise having some pain in her R flank to RLQ. She has had a CT scan a few months ago for this which did not show any particular etiology. She thinks it is tender to the touch, sometimes she thinks produced by eating and can make her feel worse if she drinks soda. She does not think it is bothering her so much recently. She thinks having a bowel movement will take away her pain sometimes. She is having a bowel movement once every 3-4 days. She denies any blood in the stools. No FH of colon cancer.  She takes celebrex PRN.   Prior workup per Dr. Benson [place]: CT scan abdomen 09/17/16 - diverticulosis, otherwise DJD of lumbar spine Reportedly prior normal [place] and HIDA scan Colonoscopy 11/19/14 - 12mm polyp, pancolonic diverticulosis Colonoscopy 07/04/2012 - multiple small adenomas, diverticulosis, hemorrhoids EGD 11/19/09 - reflux esophagitis, otherwise normal EGD 161096 - LA grade D esophagitis, 5cm hiatal hernia EGD 11/19/2014 - LA grade D esophagitis, 2cm hiatal hernia  Esophageal manometry 12/13/2014 - overall weak peristalsis, IRP normal, 20% large breaks  Past Medical History:  Diagnosis Date  . Arthritis   . Colon polyps   . Diverticulosis   . GERD (gastroesophageal reflux disease)   . Hemorrhoids, external without complications   . Hemorrhoids,  internal   . Hernia, diaphragmatic, without obstruction   . HH (hiatus hernia) 2014  . Hypertension   . Reflux esophagitis   . Tubular adenoma of colon      Past Surgical History:  Procedure Laterality Date  . ESOPHAGEAL MANOMETRY N/A 12/13/2014   Procedure: ESOPHAGEAL MANOMETRY (EM);  Surgeon: Carol Ada, MD;  Location: [facility];  Service: Endoscopy;  Laterality: N/A;  . SPINE SURGERY     Neck   Family History  Problem Relation Age of Onset  . Hypertension Father   . Arthritis Brother   . Colon cancer Neg Hx   . Rectal cancer Neg Hx   . Esophageal cancer Neg Hx   . Stomach cancer Neg Hx   . Liver cancer Neg Hx    Social History  Substance Use Topics  . Smoking status: Never Smoker  . Smokeless tobacco: Never Used  . Alcohol use No   Current Outpatient Prescriptions  Medication Sig Dispense Refill  . b complex vitamins tablet Take 1 tablet by mouth daily.    . celecoxib (CELEBREX) 100 MG capsule Take 1 capsule (100 mg total) by mouth 2 (two) times daily. Use as needed for arthritis pain 60 capsule 6  . Multiple Vitamin (MULTIVITAMIN) tablet Take 1 tablet by mouth daily.    . RaNITidine HCl (ZANTAC PO) Take by mouth.     No current facility-administered medications for this visit.    No Known Allergies   Review of Systems: All systems reviewed and negative except  where noted in HPI.   Lab Results  Component Value Date   WBC 8.6 10/01/2016   HGB 14.1 10/01/2016   HCT 40.7 10/01/2016   MCV 88.5 10/01/2016   PLT 297 10/01/2016    Lab Results  Component Value Date   CREATININE 1.02 (H) 10/01/2016   BUN 15 10/01/2016   NA 134 (L) 10/01/2016   K 4.0 10/01/2016   CL 101 10/01/2016   CO2 23 10/01/2016    Lab Results  Component Value Date   ALT 19 10/01/2016   AST 25 10/01/2016   ALKPHOS 100 10/01/2016   BILITOT 0.6 10/01/2016     Physical Exam: BP 128/82   Pulse 72   Ht 5\' 6"  (1.676 m)   Wt 190 lb (86.2 kg)   BMI 30.67 kg/m    Constitutional: Pleasant,well-developed, female in no acute distress. HEENT: Normocephalic and atraumatic. Conjunctivae are normal. No scleral icterus. Neck supple.  Cardiovascular: Normal rate, regular rhythm.  Pulmonary/chest: Effort normal and breath sounds normal. No wheezing, rales or rhonchi. Abdominal: Soft, nondistended, nontender, could not reproduce symptoms. There are no masses palpable. No hepatomegaly. Extremities: no edema Lymphadenopathy: No cervical adenopathy noted. Neurological: Alert and oriented to person place and time. Skin: Skin is warm and dry. No rashes noted. Psychiatric: Normal mood and affect. Behavior is normal.   ASSESSMENT AND PLAN: 66 y/o female here for new patient evaluation to discuss the following issues:  GERD / history of esophagitis - she has ongoing frequent reflux symptoms, not taking anything for this. LA grade D esophagitis on multiple EGDs along with hiatal hernia. I counseled her on risks of Barrett's esophagus with poorly controlled reflux / esophagitis. I discussed the risks and benefits of long-term PPI use with her, I think she warrants PPI therapy at this time. Recommend omeprazole 40 mg once daily, or low-dose dosest needed to control her symptoms (he can increase to twice a day if needed). We'll plan for an EGD in 6 weeks or so after starting PPI to screen for Barrett's esophagus, ensure healing of esophagitis. Discussed risk and benefits of EGD and she wished proceed.   Right lower quadrant pain - CT scan is unremarkable and I reassured her. Not sure if this is bowel spasm versus musculoskeletal. I offered her a trial of Bentyl, she declined, it is getting better over time. If he gets worse or bothers her in the future she can follow-up as needed.   Quinby Cellar, MD [facility] Pager [phone]  CC: Copland, Gay Filler, MD

## 2016-12-20 ENCOUNTER — Ambulatory Visit: Payer: Medicare Other | Admitting: Podiatry

## 2017-01-03 ENCOUNTER — Encounter: Payer: Self-pay | Admitting: Gastroenterology

## 2017-01-09 NOTE — Progress Notes (Signed)
[place] at [facility] [address] [place], [place] [address] [phone]  Date:  01/10/2017   Name:  Maria [name]   DOB:  1950/03/24   MRN:  [id number]  PCP:  Maria [name], MD    Chief Complaint: Knee Pain (c/o shooting pain knee in right knee only felt when putting pressure on knee. Already had flu vaccine. )   History of Present Illness:  Maria [name] is a 66 y.o. very pleasant female patient who presents with the following:  History of GERD, pre-diabetes.  Here today with concern of knee pain Labs done in July- A1c ok Lab Results  Component Value Date   HGBA1C 6.2 05/07/2016   Flu shot: already done this season, last week  She notes a problem with her RIGHT knee.   The left does not really bother her It has bothered her to perhaps 2 months, seems to be getting worse No known injury It hurts most when she kneels on her right knee- the left does not hurt when she kneels on it No pain with going up or down stairs A couple of years ago she did see Maria [name]- she was told that she had arthritis (we presume osteo) in her knee at that time.   The left knee does not generally hurt, but it can pop or catch at times, and can feel unstable.  She has not fallen however The right knee may pop sometimes She has not noted swelling or heat in her knees She is taking celebrex but is not sure how much it is helping with her knee pain  She is having endoscopy done on Monday per GI, Maria [name] Patient Active Problem List   Diagnosis Date Noted  . GERD (gastroesophageal reflux disease) 12/07/2016  . Prediabetes 10/13/2015  . Reflux esophagitis 11/20/2014  . NONSPECIFIC ABN FINDING RAD & OTH EXAM GI TRACT 09/19/2009    Past Medical History:  Diagnosis Date  . Arthritis   . Colon polyps   . Diverticulosis   . GERD (gastroesophageal reflux disease)   . Hemorrhoids, external without complications   . Hemorrhoids,  internal   . Hernia, diaphragmatic, without obstruction   . HH (hiatus hernia) 2014  . Hypertension   . Reflux esophagitis   . Tubular adenoma of colon     Past Surgical History:  Procedure Laterality Date  . ESOPHAGEAL MANOMETRY N/A 12/13/2014   Procedure: ESOPHAGEAL MANOMETRY (EM);  Surgeon: Carol Ada, MD;  Location: [facility];  Service: Endoscopy;  Laterality: N/A;  . SPINE SURGERY     Neck    Social History  Substance Use Topics  . Smoking status: Never Smoker  . Smokeless tobacco: Never Used  . Alcohol use No    Family History  Problem Relation Age of Onset  . Hypertension Father   . Arthritis Brother   . Colon cancer Neg Hx   . Rectal cancer Neg Hx   . Esophageal cancer Neg Hx   . Stomach cancer Neg Hx   . Liver cancer Neg Hx     No Known Allergies  Medication list has been reviewed and updated.  Current Outpatient Prescriptions on File Prior to Visit  Medication Sig Dispense Refill  . b complex vitamins tablet Take 1 tablet by mouth daily.    . celecoxib (CELEBREX) 100 MG capsule Take 1 capsule (100 mg total) by mouth 2 (two) times daily. Use as needed for arthritis  pain 60 capsule 6  . Multiple Vitamin (MULTIVITAMIN) tablet Take 1 tablet by mouth daily.    Marland Kitchen omeprazole (PRILOSEC) 40 MG capsule Take 1 capsule (40 mg total) by mouth daily. 90 capsule 3  . RaNITidine HCl (ZANTAC PO) Take by mouth.     No current facility-administered medications on file prior to visit.     Review of Systems:  As per HPI- otherwise negative.   Physical Examination: Vitals:   01/10/17 1257  BP: 140/84  Pulse: 64  Temp: 97.7 F (36.5 C)  SpO2: 98%   Vitals:   01/10/17 1257  Weight: 195 lb (88.5 kg)  Height: 5\' 6"  (1.676 m)   Body mass index is 31.47 kg/m. Ideal Body Weight: Weight in (lb) to have BMI = 25: 154.6  GEN: WDWN, NAD, Non-toxic, A & O x 3, overweight, looks well HEENT: Atraumatic, Normocephalic. Neck supple. No masses, No LAD. Ears and Nose:  No external deformity. CV: RRR, No M/G/R. No JVD. No thrill. No extra heart sounds. PULM: CTA B, no wheezes, crackles, rhonchi. No retractions. No resp. distress. No accessory muscle use. ABD: S, NT, ND, +BS. No rebound. No HSM. EXTR: No c/c/e NEURO Normal gait.  PSYCH: Normally interactive. Conversant. Not depressed or anxious appearing.  Calm demeanor.  Knees: both are stable, no effusion, but significant crepitus with manipulation/ ROM bilaterally Ligaments feel solid No heat or redness Lateral joint line tenderness right only  Dg Knee Complete 4 Views Left  Result Date: 01/10/2017 CLINICAL DATA:  Pt having bilateral knee pain for months with no injury EXAM: LEFT KNEE - COMPLETE 4+ VIEW COMPARISON:  None. FINDINGS: Advanced degenerative change at the patellofemoral compartment, with complete or near complete loss of joint space and associated osseous spurring. mediolateral compartments are relatively well preserved. No acute or suspicious osseous finding. No appreciable joint effusion and adjacent soft tissues are unremarkable. IMPRESSION: 1. Advanced degenerative change at the left patellofemoral compartment, with complete or near complete loss of joint space and associated osseous spurring. 2. No acute findings. Electronically Signed   By: Franki Cabot M.D.   On: 01/10/2017 15:44   Dg Knee Complete 4 Views Right  Result Date: 01/10/2017 CLINICAL DATA:  Bilateral knee pain for months, no known injury. EXAM: RIGHT KNEE - COMPLETE 4+ VIEW COMPARISON:  None. FINDINGS: Advanced degenerative change at the patellofemoral compartment, with complete or near complete loss of joint space and associated osseous spurring. Medial and lateral compartments appear relatively well preserved. No acute or suspicious osseous finding. No appreciable joint effusion and adjacent soft tissues are unremarkable. IMPRESSION: 1. Advanced degenerative change at the patellofemoral compartment, with complete or near  complete loss of joint space and associated osseous spurring. 2. No acute findings. Electronically Signed   By: Franki Cabot M.D.   On: 01/10/2017 15:43    Assessment and Plan: Chronic pain of right knee - Plan: DG Knee Complete 4 Views Right  Injury of left knee, initial encounter - Plan: DG Knee Complete 4 Views Left  Primary osteoarthritis of both knees - Plan: Ambulatory referral to Orthopedic Surgery  Here today with right knee pain- films reveal significant degenerative change.  She is not to the point of thinking about surgery, but would be interested in ?steroid injection Will refer to orthopedics for further management of her knee pain  Signed Lamar Blinks, MD

## 2017-01-10 ENCOUNTER — Ambulatory Visit (HOSPITAL_BASED_OUTPATIENT_CLINIC_OR_DEPARTMENT_OTHER)
Admission: RE | Admit: 2017-01-10 | Discharge: 2017-01-10 | Disposition: A | Discharge status: Home / Self Care | Payer: Medicare Other | Source: Ambulatory Visit | Attending: Family Medicine | Admitting: Family Medicine

## 2017-01-10 ENCOUNTER — Ambulatory Visit (INDEPENDENT_AMBULATORY_CARE_PROVIDER_SITE_OTHER): Payer: Medicare Other | Admitting: Family Medicine

## 2017-01-10 ENCOUNTER — Encounter: Payer: Self-pay | Admitting: Family Medicine

## 2017-01-10 VITALS — BP 140/84 | HR 64 | Temp 97.7°F | Ht 66.0 in | Wt 195.0 lb

## 2017-01-10 DIAGNOSIS — S8992XA Unspecified injury of left lower leg, initial encounter: Secondary | ICD-10-CM | POA: Insufficient documentation

## 2017-01-10 DIAGNOSIS — M170 Bilateral primary osteoarthritis of knee: Secondary | ICD-10-CM

## 2017-01-10 DIAGNOSIS — M25561 Pain in right knee: Secondary | ICD-10-CM | POA: Insufficient documentation

## 2017-01-10 DIAGNOSIS — G8929 Other chronic pain: Secondary | ICD-10-CM | POA: Insufficient documentation

## 2017-01-10 NOTE — Patient Instructions (Signed)
Please have your knee x-rays- then you can head home and I will be in touch with your x-ray reports asap

## 2017-01-17 ENCOUNTER — Ambulatory Visit (AMBULATORY_SURGERY_CENTER): Payer: Medicare Other | Admitting: Gastroenterology

## 2017-01-17 ENCOUNTER — Encounter: Payer: Self-pay | Admitting: Gastroenterology

## 2017-01-17 VITALS — BP 158/83 | HR 62 | Temp 98.4°F | Resp 11 | Ht 66.0 in | Wt 190.0 lb

## 2017-01-17 DIAGNOSIS — K219 Gastro-esophageal reflux disease without esophagitis: Secondary | ICD-10-CM

## 2017-01-17 MED ORDER — SODIUM CHLORIDE 0.9 % IV SOLN: 500.0000 mL | INTRAVENOUS | Status: DC

## 2017-01-17 NOTE — Op Note (Signed)
[place] Patient Name: Maria [name] Procedure Date: 01/17/2017 9:50 AM MRN: [id number] Endoscopist: Remo Lipps P. Tianah Lonardo MD, MD Age: 66 Referring MD:  Date of Birth: October 20, 1950 Gender: Female Account #: [id number] Procedure:                Upper GI endoscopy Indications:              Heartburn, history of severe esophagitis, now on                            omeprazole 40mg  once daily with control of                            symptoms, rule out Barrett's Medicines:                Monitored Anesthesia Care Procedure:                Pre-Anesthesia Assessment:                           - Prior to the procedure, a History and Physical                            was performed, and patient medications and                            allergies were reviewed. The patient's tolerance of                            previous anesthesia was also reviewed. The risks                            and benefits of the procedure and the sedation                            options and risks were discussed with the patient.                            All questions were answered, and informed consent                            was obtained. Prior Anticoagulants: The patient has                            taken no previous anticoagulant or antiplatelet                            agents. ASA Grade Assessment: II - A patient with                            mild systemic disease. After reviewing the risks                            and benefits, the patient was deemed in  satisfactory condition to undergo the procedure.                           After obtaining informed consent, the endoscope was                            passed under direct vision. Throughout the                            procedure, the patient's blood pressure, pulse, and                            oxygen saturations were monitored continuously. The                            Endoscope was introduced  through the mouth, and                            advanced to the body of the stomach. The upper GI                            endoscopy was accomplished without difficulty. The                            patient tolerated the procedure well. Scope In: Scope Out: Findings:                 Esophagogastric landmarks were identified: the                            Z-line was found at 30 cm, the gastroesophageal                            junction was found at 30 cm and the upper extent of                            the gastric folds was found at 35 cm from the                            incisors. No evidence of Barrett's esophagus.                           A 5 cm hiatal hernia was present.                           The exam of the esophagus was otherwise normal.                            Interval healing of esophagitis on omeprazole.                           A large amount of food was found in the gastric  body and in the gastric antrum, prohibiting                            examination of the stomach and duodenum - the                            procedure was aborted prematurely due to this                            finding. Complications:            No immediate complications. Estimated blood loss:                            None. Estimated Blood Loss:     Estimated blood loss: none. Impression:               - Esophagogastric landmarks identified.                           - 5 cm hiatal hernia.                           - No Barrett's esophagus                           - Interval healing of esophagitis                           - A large amount of residual food in the stomach,                            prohibiting evaluation of the stomach and duodenum. Recommendation:           - Patient has a contact number available for                            emergencies. The signs and symptoms of potential                            delayed complications were  discussed with the                            patient. Return to normal activities tomorrow.                            Written discharge instructions were provided to the                            patient.                           - Resume previous diet.                           - Continue present medications (omeprazole) Remo Lipps P. Niklaus Mamaril MD, MD 01/17/2017 10:05:53 AM This report has been signed electronically.

## 2017-01-17 NOTE — Patient Instructions (Signed)
Continue current medications. Call us with any questions or concerns. Thank you!   YOU HAD AN ENDOSCOPIC PROCEDURE TODAY AT [facility]:   Refer to the procedure report that was given to you for any specific questions about what was found during the examination.  If the procedure report does not answer your questions, please call your gastroenterologist to clarify.  If you requested that your care partner not be given the details of your procedure findings, then the procedure report has been included in a sealed envelope for you to review at your convenience later.  YOU SHOULD EXPECT: Some feelings of bloating in the abdomen. Passage of more gas than usual.  Walking can help get rid of the air that was put into your GI tract during the procedure and reduce the bloating. If you had a lower endoscopy (such as a colonoscopy or flexible sigmoidoscopy) you may notice spotting of blood in your stool or on the toilet paper. If you underwent a bowel prep for your procedure, you may not have a normal bowel movement for a few days.  Please Note:  You might notice some irritation and congestion in your nose or some drainage.  This is from the oxygen used during your procedure.  There is no need for concern and it should clear up in a day or so.  SYMPTOMS TO REPORT IMMEDIATELY:   Following upper endoscopy (EGD)  Vomiting of blood or coffee ground material  New chest pain or pain under the shoulder blades  Painful or persistently difficult swallowing  New shortness of breath  Fever of 100F or higher  Black, tarry-looking stools  For urgent or emergent issues, a gastroenterologist can be reached at any hour by calling [phone].   DIET:  We do recommend a small meal at first, but then you may proceed to your regular diet.  Drink plenty of fluids but you should avoid alcoholic beverages for 24 hours.  ACTIVITY:  You should plan to take it easy for the rest of today and you should NOT  DRIVE or use heavy machinery until tomorrow (because of the sedation medicines used during the test).    FOLLOW UP: Our staff will call the number listed on your records the next business day following your procedure to check on you and address any questions or concerns that you may have regarding the information given to you following your procedure. If we do not reach you, we will leave a message.  However, if you are feeling well and you are not experiencing any problems, there is no need to return our call.  We will assume that you have returned to your regular daily activities without incident.  If any biopsies were taken you will be contacted by phone or by letter within the next 1-3 weeks.  Please call us at [phone] if you have not heard about the biopsies in 3 weeks.    SIGNATURES/CONFIDENTIALITY: You and/or your care partner have signed paperwork which will be entered into your electronic medical record.  These signatures attest to the fact that that the information above on your After Visit Summary has been reviewed and is understood.  Full responsibility of the confidentiality of this discharge information lies with you and/or your care-partner.

## 2017-01-17 NOTE — Progress Notes (Signed)
Report to PACU, RN, vss, BBS= Clear.  

## 2017-01-18 ENCOUNTER — Telehealth: Payer: Self-pay | Admitting: *Deleted

## 2017-01-18 NOTE — Telephone Encounter (Signed)
No answer, second call.  Left message to call if questions or concerns. 

## 2017-01-18 NOTE — Telephone Encounter (Signed)
No answer, left message for the patient.

## 2017-05-19 ENCOUNTER — Ambulatory Visit: Payer: Medicare Other | Admitting: Family Medicine

## 2017-05-19 ENCOUNTER — Encounter: Payer: Self-pay | Admitting: Family Medicine

## 2017-05-19 VITALS — BP 122/84 | HR 89 | Temp 98.6°F | Ht 66.0 in | Wt 187.5 lb

## 2017-05-19 DIAGNOSIS — B9789 Other viral agents as the cause of diseases classified elsewhere: Secondary | ICD-10-CM | POA: Diagnosis not present

## 2017-05-19 DIAGNOSIS — J029 Acute pharyngitis, unspecified: Secondary | ICD-10-CM | POA: Diagnosis not present

## 2017-05-19 DIAGNOSIS — J069 Acute upper respiratory infection, unspecified: Secondary | ICD-10-CM

## 2017-05-19 LAB — POCT RAPID STREP A (OFFICE): RAPID STREP A SCREEN: NEGATIVE

## 2017-05-19 MED ORDER — BENZONATATE 100 MG PO CAPS: 100.0000 mg | ORAL_CAPSULE | Freq: Three times a day (TID) | ORAL | 0 refills | Status: DC | PRN

## 2017-05-19 MED ORDER — METHYLPREDNISOLONE ACETATE 80 MG/ML IJ SUSP
80.0000 mg | Freq: Once | INTRAMUSCULAR | Status: AC
  Administered 2017-05-19: 80 mg via INTRAMUSCULAR

## 2017-05-19 NOTE — Progress Notes (Signed)
Pre visit review using our clinic review tool, if applicable. No additional management support is needed unless otherwise documented below in the visit note. 

## 2017-05-19 NOTE — Patient Instructions (Addendum)
Continue to push fluids, practice good hand hygiene, and cover your mouth if you cough.  If you start having fevers, shaking or shortness of breath, seek immediate care.  OK to take Tylenol 1000 mg (2 extra strength tabs) or 975 mg (3 regular strength tabs) every 6 hours as needed.  For symptoms, consider using Vick's VapoRub on chest or under nose, air humidifier, Benadryl at night, and elevating the head of the bed. Delsym may help with the cough also.   Let us know if you need anything.

## 2017-05-19 NOTE — Progress Notes (Signed)
Chief Complaint  Patient presents with  . Sore Throat    [place] here for URI complaints.  Duration: 4 days  Associated symptoms: sinus congestion, rhinorrhea, sore throat and cough Denies: sinus pain, itchy watery eyes, ear fullness, ear pain, ear drainage, wheezing, shortness of breath, myalgia and fevers Treatment to date: Lemon, honey, Mucinex, Nyquil Sick contacts: No  ROS:  Const: Denies fevers HEENT: As noted in HPI Lungs: No SOB  Past Medical History:  Diagnosis Date  . Arthritis   . Colon polyps   . Diverticulosis   . GERD (gastroesophageal reflux disease)   . Hemorrhoids, external without complications   . Hemorrhoids, internal   . Hernia, diaphragmatic, without obstruction   . HH (hiatus hernia) 2014  . Hypertension   . Reflux esophagitis   . Tubular adenoma of colon    Family History  Problem Relation Age of Onset  . Hypertension Father   . Arthritis Brother   . Colon cancer Neg Hx   . Rectal cancer Neg Hx   . Esophageal cancer Neg Hx   . Stomach cancer Neg Hx   . Pancreatic cancer Neg Hx   . Prostate cancer Neg Hx     BP 122/84 (BP Location: Left Arm, Patient Position: Sitting, Cuff Size: Normal)   Pulse 89   Temp 98.6 F (37 C) (Oral)   Ht 5\' 6"  (1.676 m)   Wt 187 lb 8 oz (85 kg)   SpO2 95%   BMI 30.26 kg/m  General: Awake, alert, appears stated age HEENT: AT, [place], ears patent b/l and TM's neg, nares patent w/o discharge, pharynx pink and without exudates, MMM Neck: No masses or asymmetry Heart: RRR Lungs: CTAB, no accessory muscle use Psych: Age appropriate judgment and insight, normal mood and affect  Sore throat - Plan: POCT rapid strep A  Viral URI with cough - Plan: benzonatate (TESSALON) 100 MG capsule  Orders as above. Rapid strep neg. Depo shot to help with symptoms. Tx symptoms only, abx not necessary for this.  F/u prn. If starting to experience fevers, shaking, or shortness of breath, seek immediate care. Pt voiced  understanding and agreement to the plan.  [place], DO 05/19/17 11:46 AM

## 2017-05-21 ENCOUNTER — Encounter: Payer: Self-pay | Admitting: Family Medicine

## 2017-05-25 ENCOUNTER — Encounter: Payer: Self-pay | Admitting: Family Medicine

## 2017-05-26 ENCOUNTER — Encounter: Payer: Self-pay | Admitting: Family Medicine

## 2017-05-27 ENCOUNTER — Encounter: Payer: Self-pay | Admitting: Family Medicine

## 2017-08-18 LAB — HM MAMMOGRAPHY

## 2018-04-26 ENCOUNTER — Encounter: Payer: Medicare Other | Admitting: Family Medicine

## 2018-05-09 NOTE — Progress Notes (Addendum)
[facility] [address], [place], [place] [address] [phone]  Date:  05/10/2018   Name:  Maria [name]   DOB:  1950-06-16   MRN:  [id number]  PCP:  [name], MD    Chief Complaint: Annual Exam   History of Present Illness:  Maria [name] is a 68 y.o. very pleasant female patient who presents with the following:  Here today for complete physical.  History of GERD, prediabetes, hypertension I last saw her in October 2018  She has been doing well overall, here today with her daughter Jonelle Sidle  Lab Results  Component Value Date   HGBA1C 6.2 05/10/2018    Labs: Due for complete panel, she is fasting  Colon cancer screening: 2016, given 5-year follow-up.  Dr. Benson Norway She is now seeing Dr. Havery Moros for her upper GI  Immunizations: Flu done,  Shingrix- we are getting dates  Mammogram: Appears to be due- done per her GYN  Bone density: 11/2015, normal result.  Now due Pap: per her GYN, [facility]  She has noted some urinary frequency the last week or so No blood in her urine   She is out of omeprazole   Patient Active Problem List   Diagnosis Date Noted  . GERD (gastroesophageal reflux disease) 12/07/2016  . Prediabetes 10/13/2015  . Reflux esophagitis 11/20/2014  . NONSPECIFIC ABN FINDING RAD & OTH EXAM GI TRACT 09/19/2009    Past Medical History:  Diagnosis Date  . Arthritis   . Colon polyps   . Diverticulosis   . GERD (gastroesophageal reflux disease)   . Hemorrhoids, external without complications   . Hemorrhoids, internal   . Hernia, diaphragmatic, without obstruction   . HH (hiatus hernia) 2014  . Hypertension   . Reflux esophagitis   . Tubular adenoma of colon     Past Surgical History:  Procedure Laterality Date  . COLONOSCOPY    . ESOPHAGEAL MANOMETRY N/A 12/13/2014   Procedure: ESOPHAGEAL MANOMETRY (EM);  Surgeon: Carol Ada, MD;  Location: [facility];  Service: Endoscopy;   Laterality: N/A;  . POLYPECTOMY    . SPINE SURGERY     Neck  . UPPER GASTROINTESTINAL ENDOSCOPY      Social History   Tobacco Use  . Smoking status: Never Smoker  . Smokeless tobacco: Never Used  Substance Use Topics  . Alcohol use: No    Alcohol/week: 0.0 standard drinks  . Drug use: No    Family History  Problem Relation Age of Onset  . Hypertension Father   . Arthritis Brother   . Colon cancer Neg Hx   . Rectal cancer Neg Hx   . Esophageal cancer Neg Hx   . Stomach cancer Neg Hx   . Pancreatic cancer Neg Hx   . Prostate cancer Neg Hx     No Known Allergies  Medication list has been reviewed and updated.  Current Outpatient Medications on File Prior to Visit  Medication Sig Dispense Refill  . b complex vitamins tablet Take 1 tablet by mouth daily.    . Multiple Vitamin (MULTIVITAMIN) tablet Take 1 tablet by mouth daily.    . RaNITidine HCl (ZANTAC PO) Take by mouth.     No current facility-administered medications on file prior to visit.     Review of Systems:  As per HPI- otherwise negative. No chest pain or shortness of breath No skin changes No postmenopausal bleeding   Physical Examination:  Vitals:   05/10/18 1254  BP: 126/78  Pulse: 65  Resp: 16  Temp: 98.1 F (36.7 C)  SpO2: 98%   Vitals:   05/10/18 1254  Weight: 185 lb (83.9 kg)  Height: 5\' 6"  (1.676 m)   Body mass index is 29.86 kg/m. Ideal Body Weight: Weight in (lb) to have BMI = 25: 154.6  GEN: WDWN, NAD, Non-toxic, A & O x 3, overweight, looks well  HEENT: Atraumatic, Normocephalic. Neck supple. No masses, No LAD.  Bilateral TM wnl, oropharynx normal.  PEERL,EOMI.   Ears and Nose: No external deformity. CV: RRR, No M/G/R. No JVD. No thrill. No extra heart sounds. PULM: CTA B, no wheezes, crackles, rhonchi. No retractions. No resp. distress. No accessory muscle use. ABD: S, NT, ND, +BS. No rebound. No HSM. EXTR: No c/c/e NEURO Normal gait.  PSYCH: Normally interactive.  Conversant. Not depressed or anxious appearing.  Calm demeanor.   Results for orders placed or performed in visit on 05/10/18  Urine Culture  Result Value Ref Range   MICRO NUMBER: [id number]    SPECIMEN QUALITY: Adequate    Sample Source NOT GIVEN    STATUS: FINAL    Result: No Growth   CBC  Result Value Ref Range   WBC 8.9 4.0 - 10.5 K/uL   RBC 4.47 3.87 - 5.11 Mil/uL   Platelets 298.0 150.0 - 400.0 K/uL   Hemoglobin 13.5 12.0 - 15.0 g/dL   HCT 40.6 36.0 - 46.0 %   MCV 90.9 78.0 - 100.0 fl   MCHC 33.2 30.0 - 36.0 g/dL   RDW 12.5 11.5 - 15.5 %  Comprehensive metabolic panel  Result Value Ref Range   Sodium 138 135 - 145 mEq/L   Potassium 4.0 3.5 - 5.1 mEq/L   Chloride 101 96 - 112 mEq/L   CO2 29 19 - 32 mEq/L   Glucose, Bld 89 70 - 99 mg/dL   BUN 19 6 - 23 mg/dL   Creatinine, Ser 0.93 0.40 - 1.20 mg/dL   Total Bilirubin 0.5 0.2 - 1.2 mg/dL   Alkaline Phosphatase 96 39 - 117 U/L   AST 14 0 - 37 U/L   ALT 13 0 - 35 U/L   Total Protein 7.0 6.0 - 8.3 g/dL   Albumin 4.3 3.5 - 5.2 g/dL   Calcium 10.1 8.4 - 10.5 mg/dL   GFR 59.88 (L) >60.00 mL/min  Hemoglobin A1c  Result Value Ref Range   Hgb A1c MFr Bld 6.2 4.6 - 6.5 %  Lipid panel  Result Value Ref Range   Cholesterol 226 (H) 0 - 200 mg/dL   Triglycerides 107.0 0.0 - 149.0 mg/dL   HDL 45.50 >39.00 mg/dL   VLDL 21.4 0.0 - 40.0 mg/dL   LDL Cholesterol 159 (H) 0 - 99 mg/dL   Total CHOL/HDL Ratio 5    NonHDL 180.08   Urine Microscopic Only  Result Value Ref Range   WBC, UA 0-2/hpf 0-2/hpf   RBC / HPF none seen 0-2/hpf   Squamous Epithelial / LPF Rare(0-4/hpf) Rare(0-4/hpf)   Bacteria, UA Rare(<10/hpf) (A) None  POCT urinalysis dipstick  Result Value Ref Range   Color, UA yellow yellow   Clarity, UA clear clear   Glucose, UA negative negative mg/dL   Bilirubin, UA negative negative   Ketones, POC UA negative negative mg/dL   Spec Grav, UA 1.015 1.010 - 1.025   Blood, UA small (A) negative   pH, UA 6.0 5.0 - 8.0    Protein  Ur, POC negative negative mg/dL   Urobilinogen, UA 0.2 0.2 or 1.0 E.U./dL   Nitrite, UA Negative Negative   Leukocytes, UA Negative Negative    Assessment and Plan: Physical exam  Pre-diabetes - Plan: Comprehensive metabolic panel, Hemoglobin A1c  Reflux esophagitis  Screening for breast cancer  Estrogen deficiency  Screening for hyperlipidemia - Plan: Lipid panel  Screening for deficiency anemia - Plan: CBC  Arthritis - Plan: celecoxib (CELEBREX) 100 MG capsule  Right hip pain - Plan: celecoxib (CELEBREX) 100 MG capsule  Right foot pain - Plan: celecoxib (CELEBREX) 100 MG capsule  Urinary frequency - Plan: POCT urinalysis dipstick, Urine Culture  Gastroesophageal reflux disease with esophagitis - Plan: omeprazole (PRILOSEC) 40 MG capsule  Microhematuria - Plan: Urine Microscopic Only  Today for complete physical exam. She has just moved back to this area Labs pain as above, she notes mild urinary symptoms.  Her UA is relatively benign, will get a micro and culture Encouraged her to schedule an MWE with our nurse as well ------------------------------------------------------ It was very nice to see you again today I will be in touch with your labs ASAP I am sending a message to [facility], and will ask for your mammogram and bone density reports.  If you need a bone density I can order it for you  Your colonoscopy will be due next year Please follow-up with Dr. Havery Moros about your stomach symptoms [phone] We also refilled your stomach acid medication  I refilled your Celebrex, which you can use as needed for joint pains.  However I would suggest that you try Tylenol first, as this will be easier on your stomach  You had one shingles vaccine so far, this was done on 12/31/2017.  You can have the second dose anytime at your drugstore I am waiting on your follow-up urine culture, will be in touch with this result  Signed Lamar Blinks,  MD  Received her labs, message to patient Results for orders placed or performed in visit on 05/10/18  Urine Culture  Result Value Ref Range   MICRO NUMBER: [id number]    SPECIMEN QUALITY: Adequate    Sample Source NOT GIVEN    STATUS: FINAL    Result: No Growth   CBC  Result Value Ref Range   WBC 8.9 4.0 - 10.5 K/uL   RBC 4.47 3.87 - 5.11 Mil/uL   Platelets 298.0 150.0 - 400.0 K/uL   Hemoglobin 13.5 12.0 - 15.0 g/dL   HCT 40.6 36.0 - 46.0 %   MCV 90.9 78.0 - 100.0 fl   MCHC 33.2 30.0 - 36.0 g/dL   RDW 12.5 11.5 - 15.5 %  Comprehensive metabolic panel  Result Value Ref Range   Sodium 138 135 - 145 mEq/L   Potassium 4.0 3.5 - 5.1 mEq/L   Chloride 101 96 - 112 mEq/L   CO2 29 19 - 32 mEq/L   Glucose, Bld 89 70 - 99 mg/dL   BUN 19 6 - 23 mg/dL   Creatinine, Ser 0.93 0.40 - 1.20 mg/dL   Total Bilirubin 0.5 0.2 - 1.2 mg/dL   Alkaline Phosphatase 96 39 - 117 U/L   AST 14 0 - 37 U/L   ALT 13 0 - 35 U/L   Total Protein 7.0 6.0 - 8.3 g/dL   Albumin 4.3 3.5 - 5.2 g/dL   Calcium 10.1 8.4 - 10.5 mg/dL   GFR 59.88 (L) >60.00 mL/min  Hemoglobin A1c  Result Value Ref Range  Hgb A1c MFr Bld 6.2 4.6 - 6.5 %  Lipid panel  Result Value Ref Range   Cholesterol 226 (H) 0 - 200 mg/dL   Triglycerides 107.0 0.0 - 149.0 mg/dL   HDL 45.50 >39.00 mg/dL   VLDL 21.4 0.0 - 40.0 mg/dL   LDL Cholesterol 159 (H) 0 - 99 mg/dL   Total CHOL/HDL Ratio 5    NonHDL 180.08   Urine Microscopic Only  Result Value Ref Range   WBC, UA 0-2/hpf 0-2/hpf   RBC / HPF none seen 0-2/hpf   Squamous Epithelial / LPF Rare(0-4/hpf) Rare(0-4/hpf)   Bacteria, UA Rare(<10/hpf) (A) None  POCT urinalysis dipstick  Result Value Ref Range   Color, UA yellow yellow   Clarity, UA clear clear   Glucose, UA negative negative mg/dL   Bilirubin, UA negative negative   Ketones, POC UA negative negative mg/dL   Spec Grav, UA 1.015 1.010 - 1.025   Blood, UA small (A) negative   pH, UA 6.0 5.0 - 8.0   Protein Ur, POC  negative negative mg/dL   Urobilinogen, UA 0.2 0.2 or 1.0 E.U./dL   Nitrite, UA Negative Negative   Leukocytes, UA Negative Negative    Blood counts are normal Metabolic profile looks fine Your A1c-average blood sugar-is stable.  It is in the prediabetes range Your cholesterol is not bad, but your LDL (bad cholesterol) is slightly high. We calculated your estimated 10-year risk of cardiovascular disease, see below.  The 10-year ASCVD risk score Mikey Bussing DC Brooke Bonito., et al., 2013) is: 8.2%   Values used to calculate the score:     Age: 6 years     Sex: Female     Is Non-Hispanic African American: No     Diabetic: No     Tobacco smoker: No     Systolic Blood Pressure: 031 mmHg     Is BP treated: No     HDL Cholesterol: 45.5 mg/dL     Total Cholesterol: 226 mg/dL  If you like, we might consider a cholesterol medication to lower your LDL and perhaps lower your risk of cardiovascular disease.  Please let me know if you would like to start this medication.  You are really on the borderline, so I would ask for your preference  I will be in touch with your urine culture  addnd 2/21- received her urine culture Negative for any infection Message to pt

## 2018-05-09 NOTE — Patient Instructions (Addendum)
It was very nice to see you again today I will be in touch with your labs ASAP I am sending a message to [facility], and will ask for your mammogram and bone density reports.  If you need a bone density I can order it for you  Your colonoscopy will be due next year Please follow-up with Dr. Havery Moros about your stomach symptoms [phone] We also refilled your stomach acid medication  I refilled your Celebrex, which you can use as needed for joint pains.  However I would suggest that you try Tylenol first, as this will be easier on your stomach  You had one shingles vaccine so far, this was done on 12/31/2017.  You can have the second dose anytime at your drugstore I am waiting on your follow-up urine culture, will be in touch with this result   Health Maintenance After Age 67 After age 73, you are at a higher risk for certain long-term diseases and infections as well as injuries from falls. Falls are a major cause of broken bones and head injuries in people who are older than age 39. Getting regular preventive care can help to keep you healthy and well. Preventive care includes getting regular testing and making lifestyle changes as recommended by your health care provider. Talk with your health care provider about:  Which screenings and tests you should have. A screening is a test that checks for a disease when you have no symptoms.  A diet and exercise plan that is right for you. What should I know about screenings and tests to prevent falls? Screening and testing are the best ways to find a health problem early. Early diagnosis and treatment give you the best chance of managing medical conditions that are common after age 25. Certain conditions and lifestyle choices may make you more likely to have a fall. Your health care provider may recommend:  Regular vision checks. Poor vision and conditions such as cataracts can make you more likely to have a fall. If you wear  glasses, make sure to get your prescription updated if your vision changes.  Medicine review. Work with your health care provider to regularly review all of the medicines you are taking, including over-the-counter medicines. Ask your health care provider about any side effects that may make you more likely to have a fall. Tell your health care provider if any medicines that you take make you feel dizzy or sleepy.  Osteoporosis screening. Osteoporosis is a condition that causes the bones to get weaker. This can make the bones weak and cause them to break more easily.  Blood pressure screening. Blood pressure changes and medicines to control blood pressure can make you feel dizzy.  Strength and balance checks. Your health care provider may recommend certain tests to check your strength and balance while standing, walking, or changing positions.  Foot health exam. Foot pain and numbness, as well as not wearing proper footwear, can make you more likely to have a fall.  Depression screening. You may be more likely to have a fall if you have a fear of falling, feel emotionally low, or feel unable to do activities that you used to do.  Alcohol use screening. Using too much alcohol can affect your balance and may make you more likely to have a fall. What actions can I take to lower my risk of falls? General instructions  Talk with your health care provider about your risks for falling. Tell your health care provider  if: ? You fall. Be sure to tell your health care provider about all falls, even ones that seem minor. ? You feel dizzy, sleepy, or off-balance.  Take over-the-counter and prescription medicines only as told by your health care provider. These include any supplements.  Eat a healthy diet and maintain a healthy weight. A healthy diet includes low-fat dairy products, low-fat (lean) meats, and fiber from whole grains, beans, and lots of fruits and vegetables. Home safety  Remove any  tripping hazards, such as rugs, cords, and clutter.  Install safety equipment such as grab bars in bathrooms and safety rails on stairs.  Keep rooms and walkways well-lit. Activity   Follow a regular exercise program to stay fit. This will help you maintain your balance. Ask your health care provider what types of exercise are appropriate for you.  If you need a cane or walker, use it as recommended by your health care provider.  Wear supportive shoes that have nonskid soles. Lifestyle  Do not drink alcohol if your health care provider tells you not to drink.  If you drink alcohol, limit how much you have: ? 0-1 drink a day for women. ? 0-2 drinks a day for men.  Be aware of how much alcohol is in your drink. In the [place] one drink equals one typical bottle of beer (12 oz), one-half glass of wine (5 oz), or one shot of hard liquor (1 oz).  Do not use any products that contain nicotine or tobacco, such as cigarettes and e-cigarettes. If you need help quitting, ask your health care provider. Summary  Having a healthy lifestyle and getting preventive care can help to protect your health and wellness after age 79.  Screening and testing are the best way to find a health problem early and help you avoid having a fall. Early diagnosis and treatment give you the best chance for managing medical conditions that are more common for people who are older than age 88.  Falls are a major cause of broken bones and head injuries in people who are older than age 70. Take precautions to prevent a fall at home.  Work with your health care provider to learn what changes you can make to improve your health and wellness and to prevent falls. This information is not intended to replace advice given to you by your health care provider. Make sure you discuss any questions you have with your health care provider. Document Released: 01/19/2017 Document Revised: 01/19/2017 Document Reviewed:  01/19/2017 Elsevier Interactive Patient Education  2019 [employer].

## 2018-05-10 ENCOUNTER — Encounter: Payer: Self-pay | Admitting: Family Medicine

## 2018-05-10 ENCOUNTER — Ambulatory Visit (INDEPENDENT_AMBULATORY_CARE_PROVIDER_SITE_OTHER): Payer: Medicare Other | Admitting: Family Medicine

## 2018-05-10 VITALS — BP 126/78 | HR 65 | Temp 98.1°F | Resp 16 | Ht 66.0 in | Wt 185.0 lb

## 2018-05-10 DIAGNOSIS — R7303 Prediabetes: Secondary | ICD-10-CM | POA: Diagnosis not present

## 2018-05-10 DIAGNOSIS — Z1239 Encounter for other screening for malignant neoplasm of breast: Secondary | ICD-10-CM | POA: Diagnosis not present

## 2018-05-10 DIAGNOSIS — R3129 Other microscopic hematuria: Secondary | ICD-10-CM

## 2018-05-10 DIAGNOSIS — K2100 Gastro-esophageal reflux disease with esophagitis, without bleeding: Secondary | ICD-10-CM

## 2018-05-10 DIAGNOSIS — Z0000 Encounter for general adult medical examination without abnormal findings: Secondary | ICD-10-CM

## 2018-05-10 DIAGNOSIS — R350 Frequency of micturition: Secondary | ICD-10-CM | POA: Diagnosis not present

## 2018-05-10 DIAGNOSIS — Z13220 Encounter for screening for lipoid disorders: Secondary | ICD-10-CM | POA: Diagnosis not present

## 2018-05-10 DIAGNOSIS — M25551 Pain in right hip: Secondary | ICD-10-CM

## 2018-05-10 DIAGNOSIS — M1990 Unspecified osteoarthritis, unspecified site: Secondary | ICD-10-CM

## 2018-05-10 DIAGNOSIS — E2839 Other primary ovarian failure: Secondary | ICD-10-CM

## 2018-05-10 DIAGNOSIS — Z130 Encounter for screening for diseases of the blood and blood-forming organs and certain disorders involving the immune mechanism: Secondary | ICD-10-CM

## 2018-05-10 DIAGNOSIS — M79671 Pain in right foot: Secondary | ICD-10-CM

## 2018-05-10 LAB — URINALYSIS, MICROSCOPIC ONLY: RBC / HPF: NONE SEEN (ref 0–?)

## 2018-05-10 LAB — POCT URINALYSIS DIP (MANUAL ENTRY)
Bilirubin, UA: NEGATIVE
Glucose, UA: NEGATIVE mg/dL
Ketones, POC UA: NEGATIVE mg/dL
Leukocytes, UA: NEGATIVE
Nitrite, UA: NEGATIVE
Protein Ur, POC: NEGATIVE mg/dL
Spec Grav, UA: 1.015 (ref 1.010–1.025)
Urobilinogen, UA: 0.2 E.U./dL
pH, UA: 6 (ref 5.0–8.0)

## 2018-05-10 LAB — LIPID PANEL
CHOLESTEROL: 226 mg/dL — AB (ref 0–200)
HDL: 45.5 mg/dL (ref >39.00)
LDL Cholesterol: 159 mg/dL — ABNORMAL HIGH (ref 0–99)
NonHDL: 180.08
Total CHOL/HDL Ratio: 5
Triglycerides: 107 mg/dL (ref 0.0–149.0)
VLDL: 21.4 mg/dL (ref 0.0–40.0)

## 2018-05-10 LAB — COMPREHENSIVE METABOLIC PANEL
ALT: 13 U/L (ref 0–35)
AST: 14 U/L (ref 0–37)
Albumin: 4.3 g/dL (ref 3.5–5.2)
Alkaline Phosphatase: 96 U/L (ref 39–117)
BUN: 19 mg/dL (ref 6–23)
CO2: 29 meq/L (ref 19–32)
Calcium: 10.1 mg/dL (ref 8.4–10.5)
Chloride: 101 mEq/L (ref 96–112)
Creatinine, Ser: 0.93 mg/dL (ref 0.40–1.20)
GFR: 59.88 mL/min — AB (ref >60.00)
GLUCOSE: 89 mg/dL (ref 70–99)
POTASSIUM: 4 meq/L (ref 3.5–5.1)
Sodium: 138 mEq/L (ref 135–145)
Total Bilirubin: 0.5 mg/dL (ref 0.2–1.2)
Total Protein: 7 g/dL (ref 6.0–8.3)

## 2018-05-10 LAB — CBC
HEMATOCRIT: 40.6 % (ref 36.0–46.0)
HEMOGLOBIN: 13.5 g/dL (ref 12.0–15.0)
MCHC: 33.2 g/dL (ref 30.0–36.0)
MCV: 90.9 fl (ref 78.0–100.0)
Platelets: 298 10*3/uL (ref 150.0–400.0)
RBC: 4.47 Mil/uL (ref 3.87–5.11)
RDW: 12.5 % (ref 11.5–15.5)
WBC: 8.9 10*3/uL (ref 4.0–10.5)

## 2018-05-10 LAB — HEMOGLOBIN A1C: Hgb A1c MFr Bld: 6.2 % (ref 4.6–6.5)

## 2018-05-10 MED ORDER — CELECOXIB 100 MG PO CAPS: 100.0000 mg | ORAL_CAPSULE | Freq: Two times a day (BID) | ORAL | 6 refills | Status: DC

## 2018-05-10 MED ORDER — OMEPRAZOLE 40 MG PO CPDR: 40.0000 mg | DELAYED_RELEASE_CAPSULE | Freq: Every day | ORAL | 3 refills | Status: DC

## 2018-05-12 ENCOUNTER — Encounter: Payer: Self-pay | Admitting: Family Medicine

## 2018-05-12 LAB — URINE CULTURE
MICRO NUMBER: 215334
Result:: NO GROWTH
SPECIMEN QUALITY:: ADEQUATE

## 2018-05-25 ENCOUNTER — Encounter: Payer: Self-pay | Admitting: Family Medicine

## 2018-08-17 ENCOUNTER — Encounter: Payer: Self-pay | Admitting: Family Medicine

## 2018-08-17 ENCOUNTER — Other Ambulatory Visit: Payer: Self-pay

## 2018-08-17 ENCOUNTER — Ambulatory Visit: Payer: Medicare Other | Admitting: Family Medicine

## 2018-08-17 DIAGNOSIS — M79671 Pain in right foot: Secondary | ICD-10-CM

## 2018-08-17 NOTE — Progress Notes (Signed)
[place] at [facility] [address], [place], [place] [address] [phone]  Date:  08/17/2018   Name:  Maria [name]   DOB:  1950/10/01   MRN:  [id number]  PCP:  [name], MD    Chief Complaint: No chief complaint on file.   History of Present Illness:  Maria [name] is a 68 y.o. very pleasant female patient who presents with the following:  Virtual visit today due to pandemic Patient location is home/car.  She is doing some errands Provider location is office Pt ID confirmed with her name and DOB.  She consents for virtual visit today   Patient with history of GERD, prediabetes Most recent labs and office visit in February She notes that she is doing more walking than is normal for her during pandemic-this seems to have triggered foot pain Her right foot is bothering her- she has noted this for the last few days The left foot is ok She notes a burning feeling and also soreness   Her foot hurts more in the midfoot, more on the right side She has a hard time describing the exact location of pain  No swelling or redness noted  She perhaps notes that her pain is worse when she first gets up in the am- however she is not quite sure No known injury  She is wearing athletic shoes most of the time   She did see podiatry about 2 years ago-it looks like they thought she had arthritis She was given mobic and this did seem to help She does not have meloxicam on hand right now, but she does have Celebrex. BP Readings from Last 3 Encounters:  05/10/18 126/78  05/19/17 122/84  01/17/17 (!) 158/83   She is otherwise doing well, no cough, fever, other symptoms of illness     Patient Active Problem List   Diagnosis Date Noted  . GERD (gastroesophageal reflux disease) 12/07/2016  . Prediabetes 10/13/2015  . Reflux esophagitis 11/20/2014  . NONSPECIFIC ABN FINDING RAD & OTH EXAM GI TRACT 09/19/2009    Past Medical  History:  Diagnosis Date  . Arthritis   . Colon polyps   . Diverticulosis   . GERD (gastroesophageal reflux disease)   . Hemorrhoids, external without complications   . Hemorrhoids, internal   . Hernia, diaphragmatic, without obstruction   . HH (hiatus hernia) 2014  . Hypertension   . Reflux esophagitis   . Tubular adenoma of colon     Past Surgical History:  Procedure Laterality Date  . COLONOSCOPY    . ESOPHAGEAL MANOMETRY N/A 12/13/2014   Procedure: ESOPHAGEAL MANOMETRY (EM);  Surgeon: Carol Ada, MD;  Location: [facility];  Service: Endoscopy;  Laterality: N/A;  . POLYPECTOMY    . SPINE SURGERY     Neck  . UPPER GASTROINTESTINAL ENDOSCOPY      Social History   Tobacco Use  . Smoking status: Never Smoker  . Smokeless tobacco: Never Used  Substance Use Topics  . Alcohol use: No    Alcohol/week: 0.0 standard drinks  . Drug use: No    Family History  Problem Relation Age of Onset  . Hypertension Father   . Arthritis Brother   . Colon cancer Neg Hx   . Rectal cancer Neg Hx   . Esophageal cancer Neg Hx   . Stomach cancer Neg Hx   . Pancreatic cancer Neg Hx   . Prostate cancer Neg  Hx     No Known Allergies  Medication list has been reviewed and updated.  Current Outpatient Medications on File Prior to Visit  Medication Sig Dispense Refill  . b complex vitamins tablet Take 1 tablet by mouth daily.    . celecoxib (CELEBREX) 100 MG capsule Take 1 capsule (100 mg total) by mouth 2 (two) times daily. Use as needed for arthritis pain 60 capsule 6  . Multiple Vitamin (MULTIVITAMIN) tablet Take 1 tablet by mouth daily.    Marland Kitchen omeprazole (PRILOSEC) 40 MG capsule Take 1 capsule (40 mg total) by mouth daily. 90 capsule 3  . RaNITidine HCl (ZANTAC PO) Take by mouth.     No current facility-administered medications on file prior to visit.     Review of Systems:  As per HPI- otherwise negative.   Physical Examination: There were no vitals filed for this  visit. There were no vitals filed for this visit. There is no height or weight on file to calculate BMI. Ideal Body Weight:    Pt observed over video She looks well No cough or distress noted   Assessment and Plan: Right foot pain  Virtual visit today to discuss foot pain.  However, due to her particular complaint we need to see her to make a diagnosis We made her an appt for this coming Monday.  She is asked to refrain from coming in if she becomes ill in the meantime She will try Celebrex, and I encouraged her to wear supportive shoes and limit her walking until we see each other  Signed Lamar Blinks, MD

## 2018-08-19 NOTE — Progress Notes (Signed)
[place] at [facility] [address], [place], [place] [address] [phone]  Date:  08/21/2018   Name:  Maria [name]   DOB:  [birth year]   MRN:  [id number]  PCP:  [name], MD    Chief Complaint: Foot Pain (right foot pain, 2 weeks, walking has bothered it)   History of Present Illness:  Maria [name] is a 68 y.o. very pleasant female patient who presents with the following:  In person visit today due to foot pain We did a virtual visit last week and I asked her to come in for further evaluation  At that time she described a burning, sore feeling in her right mid foot.   From HPI from last week:  She notes that she is doing more walking than is normal for her during pandemic-this seems to have triggered foot pain Her right foot is bothering her- she has noted this for the last few days The left foot is ok She notes a burning feeling and also soreness   Her foot hurts more in the midfoot, more on the right side She has a hard time describing the exact location of pain  No swelling or redness noted  She perhaps notes that her pain is worse when she first gets up in the am- however she is not quite sure No known injury  She is wearing athletic shoes most of the time   She did see podiatry about 2 years ago-it looks like they thought she had arthritis She was given mobic and this did seem to help She does not have meloxicam on hand right now, but she does have Celebrex.  She did cut back on her walking some since we talked, and has used some celebrex or tylenol-these medications do help with her pain She has been wearing her current walking shoes for about 6 months, or a bit longer She does notice that her pain might be worse when she first arises in the morning, but also can get worse with prolonged walking  She is otherwise feeling fine  Patient Active Problem List   Diagnosis Date Noted  . GERD  (gastroesophageal reflux disease) 12/07/2016  . Prediabetes 10/13/2015  . Reflux esophagitis 11/20/2014  . NONSPECIFIC ABN FINDING RAD & OTH EXAM GI TRACT 09/19/2009    Past Medical History:  Diagnosis Date  . Arthritis   . Colon polyps   . Diverticulosis   . GERD (gastroesophageal reflux disease)   . Hemorrhoids, external without complications   . Hemorrhoids, internal   . Hernia, diaphragmatic, without obstruction   . HH (hiatus hernia) 2014  . Hypertension   . Reflux esophagitis   . Tubular adenoma of colon     Past Surgical History:  Procedure Laterality Date  . COLONOSCOPY    . ESOPHAGEAL MANOMETRY N/A 12/13/2014   Procedure: ESOPHAGEAL MANOMETRY (EM);  Surgeon: Carol Ada, MD;  Location: [facility];  Service: Endoscopy;  Laterality: N/A;  . POLYPECTOMY    . SPINE SURGERY     Neck  . UPPER GASTROINTESTINAL ENDOSCOPY      Social History   Tobacco Use  . Smoking status: Never Smoker  . Smokeless tobacco: Never Used  Substance Use Topics  . Alcohol use: No    Alcohol/week: 0.0 standard drinks  . Drug use: No    Family History  Problem Relation Age of Onset  . Hypertension Father   . Arthritis Brother   .  Colon cancer Neg Hx   . Rectal cancer Neg Hx   . Esophageal cancer Neg Hx   . Stomach cancer Neg Hx   . Pancreatic cancer Neg Hx   . Prostate cancer Neg Hx     No Known Allergies  Medication list has been reviewed and updated.  Current Outpatient Medications on File Prior to Visit  Medication Sig Dispense Refill  . b complex vitamins tablet Take 1 tablet by mouth daily.    . celecoxib (CELEBREX) 100 MG capsule Take 1 capsule (100 mg total) by mouth 2 (two) times daily. Use as needed for arthritis pain 60 capsule 6  . Multiple Vitamin (MULTIVITAMIN) tablet Take 1 tablet by mouth daily.    Marland Kitchen omeprazole (PRILOSEC) 40 MG capsule Take 1 capsule (40 mg total) by mouth daily. 90 capsule 3  . RaNITidine HCl (ZANTAC PO) Take by mouth.     No current  facility-administered medications on file prior to visit.     Review of Systems:  As per HPI- otherwise negative.  No fever or chills, no cough Physical Examination: Vitals:   08/21/18 1129  BP: 130/80  Pulse: 65  Resp: 16  Temp: 98.2 F (36.8 C)  SpO2: 96%   Vitals:   08/21/18 1129  Weight: 185 lb (83.9 kg)  Height: 5\' 6"  (1.676 m)   Body mass index is 29.86 kg/m. Ideal Body Weight: Weight in (lb) to have BMI = 25: 154.6  GEN: WDWN, NAD, Non-toxic, A & O x 3, mild overweight, looks well HEENT: Atraumatic, Normocephalic. Neck supple. No masses, No LAD. Ears and Nose: No external deformity. CV: RRR, No M/G/R. No JVD. No thrill. No extra heart sounds. PULM: CTA B, no wheezes, crackles, rhonchi. No retractions. No resp. distress. No accessory muscle use. ABD: S, NT, ND, +BS. No rebound. No HSM. EXTR: No c/c/e NEURO Normal gait.  PSYCH: Normally interactive. Conversant. Not depressed or anxious appearing.  Calm demeanor.  She has zero swelling in her feet and ankles.  Feet show normal arches for age, feet are warm and well perfused with normal pulses No bony tenderness of the right foot or ankle is appreciated.  I think her tenderness is coming from the lateral aspect of the mid plantar fascia   Assessment and Plan: Right foot pain - Plan: DG Foot Complete Right  Here today with right foot pain for a few weeks.  She has been walking more than normal, due to pandemic. We will get an x-ray today, and I went over other instructions as follows:   Please go to the ground floor to have an x-ray of your foot. Then you can go home and I will be in touch asap  Please replace your athletic shoes- generally these shoes need replacement every 6 months (or 4 months if very heavy use) Also I would suggest that you try some padded insoles unless you shoes have very good padding already  Please stretch your feet before you get out of bed in the am, and try the ice massage that we  talked about twice a day  Even if your x-ray is negative, if your feet continue to trouble you over the next 2 weeks please alert me- in this case there might be a stress fracture or something else that we don't see that needs an orthopedic specialist   Signed Lamar Blinks, MD  Received her foot x-ray as follows  Dg Foot Complete Right  Result Date: 08/21/2018 CLINICAL DATA:  Acute  right foot pain without known injury. EXAM: RIGHT FOOT COMPLETE - 3+ VIEW COMPARISON:  Radiographs of November 04, 2016. FINDINGS: There is no evidence of fracture or dislocation. Mild posterior calcaneal spurring is noted. Moderate degenerative changes seen involving the first metatarsophalangeal joint. Soft tissues are unremarkable. 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 No acute abnormality seen in the right foot. Electronically Signed   By: Marijo Conception M.D.   On: 08/21/2018 15:44   Message sent to patient

## 2018-08-21 ENCOUNTER — Ambulatory Visit (HOSPITAL_BASED_OUTPATIENT_CLINIC_OR_DEPARTMENT_OTHER)
Admission: RE | Admit: 2018-08-21 | Discharge: 2018-08-21 | Disposition: A | Discharge status: Home / Self Care | Payer: Medicare Other | Source: Ambulatory Visit | Attending: Family Medicine | Admitting: Family Medicine

## 2018-08-21 ENCOUNTER — Ambulatory Visit: Payer: Medicare Other | Admitting: Family Medicine

## 2018-08-21 ENCOUNTER — Other Ambulatory Visit (HOSPITAL_BASED_OUTPATIENT_CLINIC_OR_DEPARTMENT_OTHER): Payer: Self-pay | Admitting: Family Medicine

## 2018-08-21 ENCOUNTER — Other Ambulatory Visit: Payer: Self-pay

## 2018-08-21 ENCOUNTER — Encounter: Payer: Self-pay | Admitting: Family Medicine

## 2018-08-21 VITALS — BP 130/80 | HR 65 | Temp 98.2°F | Resp 16 | Ht 66.0 in | Wt 185.0 lb

## 2018-08-21 DIAGNOSIS — M79671 Pain in right foot: Secondary | ICD-10-CM

## 2018-08-21 DIAGNOSIS — M25552 Pain in left hip: Secondary | ICD-10-CM

## 2018-08-21 NOTE — Patient Instructions (Signed)
It was great to see you today, as always!    Please go to the ground floor to have an x-ray of your foot. Then you can go home and I will be in touch asap  Please replace your athletic shoes- generally these shoes need replacement every 6 months (or 4 months if very heavy use) Also I would suggest that you try some padded insoles unless you shoes have very good padding already  Please stretch your feet before you get out of bed in the am, and try the ice massage that we talked about twice a day  Even if your x-ray is negative, if your feet continue to trouble you over the next 2 weeks please alert me- in this case there might be a stress fracture or something else that we don't see that needs an orthopedic specialist

## 2018-08-22 ENCOUNTER — Telehealth: Payer: Self-pay

## 2018-08-22 NOTE — Telephone Encounter (Signed)
Copied from [place] [id number]. Topic: General - Other >> Aug 22, 2018 11:35 AM Maria [name], NT wrote: Patient is calling to get x-ray results. States she is unable to see it on her end and is wanting a call back to discuss and see what next step is. Call back is [phone].

## 2018-08-22 NOTE — Telephone Encounter (Signed)
Patient notified of x-ray results.

## 2018-10-18 ENCOUNTER — Telehealth: Payer: Self-pay | Admitting: Family Medicine

## 2018-10-18 NOTE — Telephone Encounter (Signed)
Called her back- she was trying to donate blood yesterday and they had to stop as her blood clotted in the catheter She is otherwise feeling well, no unusual clotting or bruising I asked her to see me for a routine visit at her convenience to do labs

## 2018-10-18 NOTE — Telephone Encounter (Signed)
Patient states while donating blood she was told that her blood is clotting. Patient was unable to finish giving blood at the time. Patient inquired if this was something she should be concerned about or if it is normal. She is requesting a call back from CMA to discuss whether appointment is appropriate. Please advise.

## 2018-10-25 ENCOUNTER — Other Ambulatory Visit: Payer: Self-pay

## 2018-10-25 DIAGNOSIS — Z20822 Contact with and (suspected) exposure to covid-19: Secondary | ICD-10-CM

## 2018-10-26 LAB — NOVEL CORONAVIRUS, NAA: SARS-CoV-2, NAA: NOT DETECTED

## 2018-12-20 ENCOUNTER — Telehealth: Payer: Self-pay | Admitting: *Deleted

## 2018-12-20 ENCOUNTER — Encounter: Payer: Self-pay | Admitting: Family Medicine

## 2018-12-20 NOTE — Telephone Encounter (Signed)
Copied from [place] [id number]. Topic: General - Other >> Dec 20, 2018 11:58 AM Leward Quan A wrote: Reason for CRM: Patient called to ask Dr Lorelei Pont or her nurse  if she had a Pnuemonia shot Prevnar 27 in 2018 and a booster the following year will she still need the shot again. Please call patient at Ph# [phone]

## 2018-12-21 NOTE — Telephone Encounter (Signed)
Is patient due for another dose?

## 2018-12-21 NOTE — Telephone Encounter (Signed)
Patient advised and verbalized understanding, no pneumonia shot needed.

## 2019-02-08 ENCOUNTER — Other Ambulatory Visit: Payer: Self-pay

## 2019-02-08 DIAGNOSIS — Z20822 Contact with and (suspected) exposure to covid-19: Secondary | ICD-10-CM

## 2019-02-10 ENCOUNTER — Ambulatory Visit (INDEPENDENT_AMBULATORY_CARE_PROVIDER_SITE_OTHER): Payer: Medicare Other | Admitting: Family Medicine

## 2019-02-10 ENCOUNTER — Encounter: Payer: Self-pay | Admitting: Family Medicine

## 2019-02-10 DIAGNOSIS — K2100 Gastro-esophageal reflux disease with esophagitis, without bleeding: Secondary | ICD-10-CM

## 2019-02-10 DIAGNOSIS — R197 Diarrhea, unspecified: Secondary | ICD-10-CM

## 2019-02-10 MED ORDER — OMEPRAZOLE 40 MG PO CPDR: 40.0000 mg | DELAYED_RELEASE_CAPSULE | Freq: Every day | ORAL | 3 refills | Status: DC

## 2019-02-10 NOTE — Progress Notes (Signed)
Virtual Visit via Video Note  I connected with Maria [name] on 02/10/19 at 11:40 AM EST by a video enabled telemedicine application and verified that I am speaking with the correct person using two identifiers. Pt also c/o burning in abd --- she stopped her omeprazole several months ago but she does have it at home   Location: Patient: in car at pharmacy parking lot  Provider: home    I discussed the limitations of evaluation and management by telemedicine and the availability of in person appointments. The patient expressed understanding and agreed to proceed.  History of Present Illness: Pt is in car and c/o diarrhea x few days  No NV   No fever Pt has a neighbor who was recently tested for covid due to exposure --- pt was tested as well ----  Result pending     Observations/Objective: No fever  No other vitals obtained Pt is in NAD  Assessment and Plan: 1. Diarrhea, unspecified type immodium otc pedialyte ice pops, gatorade, ginger ale Er if worsens F/u virtual with pcp Monday if no better   2. Gastroesophageal reflux disease with esophagitis Pt will restart omeprazole  F/u pcp  - omeprazole (PRILOSEC) 40 MG capsule; Take 1 capsule (40 mg total) by mouth daily.  Dispense: 90 capsule; Refill: 3   Follow Up Instructions:    I discussed the assessment and treatment plan with the patient. The patient was provided an opportunity to ask questions and all were answered. The patient agreed with the plan and demonstrated an understanding of the instructions.   The patient was advised to call back or seek an in-person evaluation if the symptoms worsen or if the condition fails to improve as anticipated.  I provided 15  minutes of non-face-to-face time during this encounter.   Ann Held, DO

## 2019-02-12 LAB — NOVEL CORONAVIRUS, NAA: SARS-CoV-2, NAA: NOT DETECTED

## 2019-04-12 NOTE — Progress Notes (Signed)
Virtual Visit via Video Note  I connected with patient on 04/13/19 at 11:45 AM EST by audio enabled telemedicine application and verified that I am speaking with the correct person using two identifiers.   THIS ENCOUNTER IS A VIRTUAL VISIT DUE TO COVID-19 - PATIENT WAS NOT SEEN IN THE OFFICE. PATIENT HAS CONSENTED TO VIRTUAL VISIT / TELEMEDICINE VISIT   Location of patient: home  Location of provider: office  I discussed the limitations of evaluation and management by telemedicine and the availability of in person appointments. The patient expressed understanding and agreed to proceed.   Subjective:   Maria [name] is a 69 y.o. female who presents for Medicare Annual (Subsequent) preventive examination.  Review of Systems:  Cardiac Risk Factors include: advanced age (>57men, >65 women) Home Safety/Smoke Alarms: Feels safe in home. Smoke alarms in place.  Lives alone on 1st floor. Step over tub w/ floor.   Female:   Mammo-  Pt reports she does w/OBGYN. Record requested.  Dexa scan-   Pt reports she does w/OBGYN.  Record requested.     CCS-11/19/14. Recall 5 yrs.    Objective:     Vitals: Unable to assess. This visit is enabled though telemedicine due to Covid 19.   Advanced Directives 04/13/2019 11/14/2016 10/01/2016 05/24/2016  Does Patient Have a Medical Advance Directive? Yes No No Yes  Type of Advance Directive Living will - - [place];Living will  Does patient want to make changes to medical advance directive? No - Patient declined - - -  Copy of [place] in Chart? - - - No - copy requested  Would patient like information on creating a medical advance directive? - - No - Patient declined -    Tobacco Social History   Tobacco Use  Smoking Status Never Smoker  Smokeless Tobacco Never Used     Counseling given: Not Answered   Clinical Intake: Pain : No/denies pain   Past Medical History:  Diagnosis Date  . Arthritis   .  Colon polyps   . Diverticulosis   . GERD (gastroesophageal reflux disease)   . Hemorrhoids, external without complications   . Hemorrhoids, internal   . Hernia, diaphragmatic, without obstruction   . HH (hiatus hernia) 2014  . Hypertension   . Reflux esophagitis   . Tubular adenoma of colon    Past Surgical History:  Procedure Laterality Date  . COLONOSCOPY    . ESOPHAGEAL MANOMETRY N/A 12/13/2014   Procedure: ESOPHAGEAL MANOMETRY (EM);  Surgeon: Carol Ada, MD;  Location: [facility];  Service: Endoscopy;  Laterality: N/A;  . POLYPECTOMY    . SPINE SURGERY     Neck  . UPPER GASTROINTESTINAL ENDOSCOPY     Family History  Problem Relation Age of Onset  . Hypertension Father   . Arthritis Brother   . Colon cancer Neg Hx   . Rectal cancer Neg Hx   . Esophageal cancer Neg Hx   . Stomach cancer Neg Hx   . Pancreatic cancer Neg Hx   . Prostate cancer Neg Hx    Social History   Socioeconomic History  . Marital status: Widowed    Spouse name: Not on file  . Number of children: 3  . Years of education: 70  . Highest education level: Not on file  Occupational History  . Occupation: Retired  Tobacco Use  . Smoking status: Never Smoker  . Smokeless tobacco: Never Used  Substance and Sexual Activity  . Alcohol  use: No    Alcohol/week: 0.0 standard drinks  . Drug use: No  . Sexual activity: Never  Other Topics Concern  . Not on file  Social History Narrative  . Not on file   Social Determinants of Health   Financial Resource Strain:   . Difficulty of Paying Living Expenses: Not on file  Food Insecurity:   . Worried About Charity fundraiser in the Last Year: Not on file  . Ran Out of Food in the Last Year: Not on file  Transportation Needs:   . Lack of Transportation (Medical): Not on file  . Lack of Transportation (Non-Medical): Not on file  Physical Activity:   . Days of Exercise per Week: Not on file  . Minutes of Exercise per Session: Not on file  Stress:     . Feeling of Stress : Not on file  Social Connections:   . Frequency of Communication with Friends and Family: Not on file  . Frequency of Social Gatherings with Friends and Family: Not on file  . Attends Religious Services: Not on file  . Active Member of Clubs or Organizations: Not on file  . Attends Archivist Meetings: Not on file  . Marital Status: Not on file    Outpatient Encounter Medications as of 04/13/2019  Medication Sig  . b complex vitamins tablet Take 1 tablet by mouth daily.  . celecoxib (CELEBREX) 100 MG capsule Take 1 capsule (100 mg total) by mouth 2 (two) times daily. Use as needed for arthritis pain  . Multiple Vitamin (MULTIVITAMIN) tablet Take 1 tablet by mouth daily.  Marland Kitchen omeprazole (PRILOSEC) 40 MG capsule Take 1 capsule (40 mg total) by mouth daily.   No facility-administered encounter medications on file as of 04/13/2019.    Activities of Daily Living In your present state of health, do you have any difficulty performing the following activities: 04/13/2019  Hearing? N  Vision? N  Difficulty concentrating or making decisions? N  Walking or climbing stairs? N  Dressing or bathing? N  Doing errands, shopping? N  Preparing Food and eating ? N  Using the Toilet? N  In the past six months, have you accidently leaked urine? N  Do you have problems with loss of bowel control? N  Managing your Medications? N  Managing your Finances? N  Housekeeping or managing your Housekeeping? N  Some recent data might be hidden    Patient Care Team: Copland, Gay Filler, MD as PCP - General (Family Medicine) Linda Hedges, DO as Consulting Physician (Obstetrics and Gynecology)    Assessment:   This is a routine wellness examination for Maria [name]. Physical assessment deferred to PCP.  Exercise Activities and Dietary recommendations Current Exercise Habits: Home exercise routine, Type of exercise: walking, Time (Minutes): 30, Frequency (Times/Week): 4, Weekly  Exercise (Minutes/Week): 120, Intensity: Mild, Exercise limited by: None identified   Diet (meal preparation, eat out, water intake, caffeinated beverages, dairy products, fruits and vegetables): well balanced     Goals    . Lose 15 lbs     With a healthy diet and exercise.       Fall Risk Fall Risk  04/13/2019 05/10/2018 05/24/2016 10/13/2015 09/15/2015  Falls in the past year? 0 0 No No No  Number falls in past yr: 0 - - - -  Injury with Fall? 0 - - - -  Follow up Education provided;Falls prevention discussed - - - -    Depression Screen PHQ 2/9 Scores 04/13/2019  05/10/2018 05/24/2016 10/13/2015  PHQ - 2 Score 0 0 0 0     Cognitive Function Ad8 score reviewed for issues:  Issues making decisions:no  Less interest in hobbies / activities:no  Repeats questions, stories (family complaining):no  Trouble using ordinary gadgets (microwave, computer, phone):no  Forgets the month or year: no  Mismanaging finances: no  Remembering appts:no  Daily problems with thinking and/or memory:no Ad8 score is=0     MMSE - Mini Mental State Exam 05/24/2016  Orientation to time 5  Orientation to Place 5  Registration 3  Attention/ Calculation 5  Recall 2  Language- name 2 objects 2  Language- repeat 1  Language- follow 3 step command 3  Language- read & follow direction 1  Write a sentence 1  Copy design 1  Total score 29        Immunization History  Administered Date(s) Administered  . Influenza Split 01/17/2013, 12/21/2014  . Influenza, High Dose Seasonal PF 12/17/2015, 01/08/2017  . Influenza-Unspecified 12/31/2017  . Pneumococcal Conjugate-13 02/05/2015, 01/31/2016  . Pneumococcal Polysaccharide-23 01/31/2017  . Td 02/05/2015  . Zoster Recombinat (Shingrix) 12/31/2017    Screening Tests Health Maintenance  Topic Date Due  . INFLUENZA VACCINE  10/21/2018  . MAMMOGRAM  08/19/2019  . COLONOSCOPY  11/19/2019  . TETANUS/TDAP  02/04/2025  . DEXA SCAN  Completed  .  Hepatitis C Screening  Completed  . PNA vac Low Risk Adult  Completed       Plan:   See you next year!  Continue to eat heart healthy diet (full of fruits, vegetables, whole grains, lean protein, water--limit salt, fat, and sugar intake) and increase physical activity as tolerated.  Continue doing brain stimulating activities (puzzles, reading, adult coloring books, staying active) to keep memory sharp.   Bring a copy of your living will and/or healthcare power of attorney to your next office visit.    I have personally reviewed and noted the following in the patient's chart:   . Medical and social history . Use of alcohol, tobacco or illicit drugs  . Current medications and supplements . Functional ability and status . Nutritional status . Physical activity . Advanced directives . List of other physicians . Hospitalizations, surgeries, and ER visits in previous 12 months . Vitals . Screenings to include cognitive, depression, and falls . Referrals and appointments  In addition, I have reviewed and discussed with patient certain preventive protocols, quality metrics, and best practice recommendations. A written personalized care plan for preventive services as well as general preventive health recommendations were provided to patient.     Shela Nevin, [place]  04/13/2019

## 2019-04-13 ENCOUNTER — Ambulatory Visit (INDEPENDENT_AMBULATORY_CARE_PROVIDER_SITE_OTHER): Payer: Medicare PPO | Admitting: *Deleted

## 2019-04-13 ENCOUNTER — Encounter: Payer: Self-pay | Admitting: *Deleted

## 2019-04-13 VITALS — BP 138/80

## 2019-04-13 DIAGNOSIS — Z0000 Encounter for general adult medical examination without abnormal findings: Secondary | ICD-10-CM | POA: Diagnosis not present

## 2019-04-13 NOTE — Patient Instructions (Signed)
See you next year!  Continue to eat heart healthy diet (full of fruits, vegetables, whole grains, lean protein, water--limit salt, fat, and sugar intake) and increase physical activity as tolerated.  Continue doing brain stimulating activities (puzzles, reading, adult coloring books, staying active) to keep memory sharp.   Bring a copy of your living will and/or healthcare power of attorney to your next office visit.   Maria [name] , Thank you for taking time to come for your Medicare Wellness Visit. I appreciate your ongoing commitment to your health goals. Please review the following plan we discussed and let me know if I can assist you in the future.   These are the goals we discussed: Goals    . Lose 15 lbs     With a healthy diet and exercise.       This is a list of the screening recommended for you and due dates:  Health Maintenance  Topic Date Due  . Flu Shot  10/21/2018  . Mammogram  08/19/2019  . Colon Cancer Screening  11/19/2019  . Tetanus Vaccine  02/04/2025  . DEXA scan (bone density measurement)  Completed  .  Hepatitis C: One time screening is recommended by Center for Disease Control  (CDC) for  adults born from [birth year] through 1966.   Completed  . Pneumonia vaccines  Completed    Preventive Care 41 Years and Older, Female Preventive care refers to lifestyle choices and visits with your health care provider that can promote health and wellness. This includes:  A yearly physical exam. This is also called an annual well check.  Regular dental and eye exams.  Immunizations.  Screening for certain conditions.  Healthy lifestyle choices, such as diet and exercise. What can I expect for my preventive care visit? Physical exam Your health care provider will check:  Height and weight. These may be used to calculate body mass index (BMI), which is a measurement that tells if you are at a healthy weight.  Heart rate and blood pressure.  Your skin for abnormal  spots. Counseling Your health care provider may ask you questions about:  Alcohol, tobacco, and drug use.  Emotional well-being.  Home and relationship well-being.  Sexual activity.  Eating habits.  History of falls.  Memory and ability to understand (cognition).  Work and work Statistician.  Pregnancy and menstrual history. What immunizations do I need?  Influenza (flu) vaccine  This is recommended every year. Tetanus, diphtheria, and pertussis (Tdap) vaccine  You may need a Td booster every 10 years. Varicella (chickenpox) vaccine  You may need this vaccine if you have not already been vaccinated. Zoster (shingles) vaccine  You may need this after age 72. Pneumococcal conjugate (PCV13) vaccine  One dose is recommended after age 73. Pneumococcal polysaccharide (PPSV23) vaccine  One dose is recommended after age 4. Measles, mumps, and rubella (MMR) vaccine  You may need at least one dose of MMR if you were born in 1957 or later. You may also need a second dose. Meningococcal conjugate (MenACWY) vaccine  You may need this if you have certain conditions. Hepatitis A vaccine  You may need this if you have certain conditions or if you travel or work in places where you may be exposed to hepatitis A. Hepatitis B vaccine  You may need this if you have certain conditions or if you travel or work in places where you may be exposed to hepatitis B. Haemophilus influenzae type b (Hib) vaccine  You may need  this if you have certain conditions. You may receive vaccines as individual doses or as more than one vaccine together in one shot (combination vaccines). Talk with your health care provider about the risks and benefits of combination vaccines. What tests do I need? Blood tests  Lipid and cholesterol levels. These may be checked every 5 years, or more frequently depending on your overall health.  Hepatitis C test.  Hepatitis B test. Screening  Lung cancer  screening. You may have this screening every year starting at age 55 if you have a 30-pack-year history of smoking and currently smoke or have quit within the past 15 years.  Colorectal cancer screening. All adults should have this screening starting at age 50 and continuing until age 75. Your health care provider may recommend screening at age 45 if you are at increased risk. You will have tests every 1-10 years, depending on your results and the type of screening test.  Diabetes screening. This is done by checking your blood sugar (glucose) after you have not eaten for a while (fasting). You may have this done every 1-3 years.  Mammogram. This may be done every 1-2 years. Talk with your health care provider about how often you should have regular mammograms.  BRCA-related cancer screening. This may be done if you have a family history of breast, ovarian, tubal, or peritoneal cancers. Other tests  Sexually transmitted disease (STD) testing.  Bone density scan. This is done to screen for osteoporosis. You may have this done starting at age 65. Follow these instructions at home: Eating and drinking  Eat a diet that includes fresh fruits and vegetables, whole grains, lean protein, and low-fat dairy products. Limit your intake of foods with high amounts of sugar, saturated fats, and salt.  Take vitamin and mineral supplements as recommended by your health care provider.  Do not drink alcohol if your health care provider tells you not to drink.  If you drink alcohol: ? Limit how much you have to 0-1 drink a day. ? Be aware of how much alcohol is in your drink. In the [place] one drink equals one 12 oz bottle of beer (355 mL), one 5 oz glass of wine (148 mL), or one 1 oz glass of hard liquor (44 mL). Lifestyle  Take daily care of your teeth and gums.  Stay active. Exercise for at least 30 minutes on 5 or more days each week.  Do not use any products that contain nicotine or tobacco, such as  cigarettes, e-cigarettes, and chewing tobacco. If you need help quitting, ask your health care provider.  If you are sexually active, practice safe sex. Use a condom or other form of protection in order to prevent STIs (sexually transmitted infections).  Talk with your health care provider about taking a low-dose aspirin or statin. What's next?  Go to your health care provider once a year for a well check visit.  Ask your health care provider how often you should have your eyes and teeth checked.  Stay up to date on all vaccines. This information is not intended to replace advice given to you by your health care provider. Make sure you discuss any questions you have with your health care provider. Document Revised: 03/02/2018 Document Reviewed: 03/02/2018 Elsevier Patient Education  2020 [employer]  

## 2019-04-14 NOTE — Progress Notes (Addendum)
[facility] [address], [place], [place] [address] [phone]  Date:  04/16/2019   Name:  Maria [name]   DOB:  1950-05-16   MRN:  [id number]  PCP:  [name], MD    Chief Complaint: Hip Pain (bilateral hip pain, no known injury)   History of Present Illness:  Maria [name] is a 69 y.o. very pleasant female patient who presents with the following:  Patient with history of GERD, prediabetes I saw her most recently in June with foot pain Here today with concern of hip pain-she indicates her bilateral lower back as the painful area She is also coming due for colonoscopy this August, would like a referral- however she is not due until August, will let me know when she is ready for her referral.  She called her insurance company, and was told it was too early for referral to be sent  Flu vaccine- done ?  Second dose of Shingrix- done  Most recent labs about 1 year ago, can update today  She tries to walk for exercise- she has noted some pains in her lower back/ hips, can be worse after exercising.  Also sometimes seems worse in the morning, she wonders if she may need a new mattress Pain will alternate between the right and left sides The pain seems to come and go The pain stays in her lower back, does not radiate to her legs No leg numbness or weakness   She did see a nephrologist years ago- no longer sees them.  She wonders if her symptoms might indicate a kidney issue.  I reassured her that her discomfort is lower than location of her kidneys, but we can catch up on her labs today   Wt Readings from Last 3 Encounters:  04/16/19 191 lb (86.6 kg)  08/21/18 185 lb (83.9 kg)  05/10/18 185 lb (83.9 kg)   She is apprehensive about the COVID-19 vaccine, we discussed this today and I attempted to answer her questions  She notes that she has gained just a few pounds during the pandemic  Patient Active Problem  List   Diagnosis Date Noted  . GERD (gastroesophageal reflux disease) 12/07/2016  . Prediabetes 10/13/2015  . Reflux esophagitis 11/20/2014  . NONSPECIFIC ABN FINDING RAD & OTH EXAM GI TRACT 09/19/2009    Past Medical History:  Diagnosis Date  . Arthritis   . Colon polyps   . Diverticulosis   . GERD (gastroesophageal reflux disease)   . Hemorrhoids, external without complications   . Hemorrhoids, internal   . Hernia, diaphragmatic, without obstruction   . HH (hiatus hernia) 2014  . Hypertension   . Reflux esophagitis   . Tubular adenoma of colon     Past Surgical History:  Procedure Laterality Date  . COLONOSCOPY    . ESOPHAGEAL MANOMETRY N/A 12/13/2014   Procedure: ESOPHAGEAL MANOMETRY (EM);  Surgeon: Carol Ada, MD;  Location: [facility];  Service: Endoscopy;  Laterality: N/A;  . POLYPECTOMY    . SPINE SURGERY     Neck  . UPPER GASTROINTESTINAL ENDOSCOPY      Social History   Tobacco Use  . Smoking status: Never Smoker  . Smokeless tobacco: Never Used  Substance Use Topics  . Alcohol use: No    Alcohol/week: 0.0 standard drinks  . Drug use: No    Family History  Problem Relation Age of Onset  . Hypertension Father   .  Arthritis Brother   . Colon cancer Neg Hx   . Rectal cancer Neg Hx   . Esophageal cancer Neg Hx   . Stomach cancer Neg Hx   . Pancreatic cancer Neg Hx   . Prostate cancer Neg Hx     No Known Allergies  Medication list has been reviewed and updated.  Current Outpatient Medications on File Prior to Visit  Medication Sig Dispense Refill  . b complex vitamins tablet Take 1 tablet by mouth daily.    . celecoxib (CELEBREX) 100 MG capsule Take 1 capsule (100 mg total) by mouth 2 (two) times daily. Use as needed for arthritis pain 60 capsule 6  . Multiple Vitamin (MULTIVITAMIN) tablet Take 1 tablet by mouth daily.    Marland Kitchen omeprazole (PRILOSEC) 40 MG capsule Take 1 capsule (40 mg total) by mouth daily. 90 capsule 3   No current  facility-administered medications on file prior to visit.    Review of Systems:  As per HPI- otherwise negative.  She does use celebrex prn  No chest pain or shortness of breath  Physical Examination: Vitals:   04/16/19 1251  BP: 114/74  Pulse: 75  Resp: 16  Temp: (!) 97 F (36.1 C)  SpO2: 98%   Vitals:   04/16/19 1251  Weight: 191 lb (86.6 kg)  Height: 5\' 6"  (1.676 m)   Body mass index is 30.83 kg/m. Ideal Body Weight: Weight in (lb) to have BMI = 25: 154.6  GEN: WDWN, NAD, Non-toxic, A & O x 3, obese, looks well  HEENT: Atraumatic, Normocephalic. Neck supple. No masses, No LAD. Ears and Nose: No external deformity. CV: RRR, No M/G/R. No JVD. No thrill. No extra heart sounds. PULM: CTA B, no wheezes, crackles, rhonchi. No retractions. No resp. distress. No accessory muscle use. ABD: S, NT, ND, +BS. No rebound. No HSM. EXTR: No c/c/e NEURO Normal gait.  PSYCH: Normally interactive. Conversant. Not depressed or anxious appearing.  Calm demeanor.  She has excellent thoracolumbar range of motion.  Normal bilateral lower extremity strength, sensation, DTR.  Negative straight leg raise bilaterally.  Her hip range of motion is normal, no pain bilaterally.  She indicates the lower lumbar sacral area as the area of concern, seems to be centered over her SI joints   Assessment and Plan: Pre-diabetes - Plan: Comprehensive metabolic panel, Hemoglobin A1c  Screening for hyperlipidemia - Plan: Lipid panel  Screening for deficiency anemia - Plan: CBC  Muscle pain - Plan: CK  Chronic SI joint pain - Plan: DG Lumbar Spine Complete, DG Pelvis 1-2 Views  Chronic bilateral low back pain without sciatica - Plan: DG Lumbar Spine Complete, DG Pelvis 1-2 Views  Here today with concern of lower back pain.  I explained that I think this is actually her SI joints.  We will get some films of her lower back and hips today Routine labs also as above She has some muscle aches, can be worse  after sleeping.  Check CK Moderate medical decision making today This visit occurred during the SARS-CoV-2 public health emergency.  Safety protocols were in place, including screening questions prior to the visit, additional usage of staff PPE, and extensive cleaning of exam room while observing appropriate contact time as indicated for disinfecting solutions.    Signed Lamar Blinks, MD    Received her labs and x-ray so far-message to patient Mildly decreased GFR  Results for orders placed or performed in visit on 04/16/19  CBC  Result Value Ref Range  WBC 8.5 4.0 - 10.5 K/uL   RBC 4.45 3.87 - 5.11 Mil/uL   Platelets 275.0 150.0 - 400.0 K/uL   Hemoglobin 13.3 12.0 - 15.0 g/dL   HCT 40.6 36.0 - 46.0 %   MCV 91.2 78.0 - 100.0 fl   MCHC 32.7 30.0 - 36.0 g/dL   RDW 12.8 11.5 - 15.5 %  Comprehensive metabolic panel  Result Value Ref Range   Sodium 137 135 - 145 mEq/L   Potassium 4.3 3.5 - 5.1 mEq/L   Chloride 102 96 - 112 mEq/L   CO2 28 19 - 32 mEq/L   Glucose, Bld 107 (H) 70 - 99 mg/dL   BUN 17 6 - 23 mg/dL   Creatinine, Ser 0.97 0.40 - 1.20 mg/dL   Total Bilirubin 0.3 0.2 - 1.2 mg/dL   Alkaline Phosphatase 92 39 - 117 U/L   AST 19 0 - 37 U/L   ALT 17 0 - 35 U/L   Total Protein 6.8 6.0 - 8.3 g/dL   Albumin 4.2 3.5 - 5.2 g/dL   GFR 56.88 (L) >60.00 mL/min   Calcium 9.8 8.4 - 10.5 mg/dL  Hemoglobin A1c  Result Value Ref Range   Hgb A1c MFr Bld 6.3 4.6 - 6.5 %  Lipid panel  Result Value Ref Range   Cholesterol 187 0 - 200 mg/dL   Triglycerides 101.0 0.0 - 149.0 mg/dL   HDL 44.50 >39.00 mg/dL   VLDL 20.2 0.0 - 40.0 mg/dL   LDL Cholesterol 122 (H) 0 - 99 mg/dL   Total CHOL/HDL Ratio 4    NonHDL 142.38   CK  Result Value Ref Range   Total CK 86 7 - 177 U/L   DG Lumbar Spine Complete  Result Date: 04/16/2019 CLINICAL DATA:  Chronic low back pain EXAM: LUMBAR SPINE - COMPLETE 4+ VIEW COMPARISON:  CT 09/17/2016, radiograph 12/18/2015 FINDINGS: Trace anterolisthesis  L4 on L5. Vertebral body heights are maintained. Osteophytes at multiple levels. Advanced degeneration at L5-S1 with disc space narrowing, endplate sclerosis, osteophyte and posterior facet degenerative change. Lucency in the region of the pars interarticularis on 1 of the lateral views at L4 though not well demonstrated on oblique views and not visible on prior exams. IMPRESSION: 1. Multiple level degenerative changes, most advanced at L5-S1. 2. Trace anterolisthesis L4 on L5 with questionable pars lucency on one view. CT could be obtained for further evaluation. Electronically Signed   By: Donavan Foil M.D.   On: 04/16/2019 16:59   DG Pelvis 1-2 Views  Result Date: 04/16/2019 CLINICAL DATA:  Chronic bilateral hip pain. No known injury. Initial encounter. EXAM: PELVIS - 1-2 VIEW COMPARISON:  None. FINDINGS: Minimal bilateral hip joint space narrowing noted. No fracture, subluxation or dislocation identified. No focal bony lesions are identified. IMPRESSION: Minimal bilateral hip degenerative changes. Electronically Signed   By: Margarette Canada M.D.   On: 04/16/2019 14:26

## 2019-04-16 ENCOUNTER — Ambulatory Visit: Payer: Medicare PPO | Admitting: Family Medicine

## 2019-04-16 ENCOUNTER — Ambulatory Visit (HOSPITAL_BASED_OUTPATIENT_CLINIC_OR_DEPARTMENT_OTHER)
Admission: RE | Admit: 2019-04-16 | Discharge: 2019-04-16 | Disposition: A | Discharge status: Home / Self Care | Payer: Medicare PPO | Source: Ambulatory Visit | Attending: Family Medicine | Admitting: Family Medicine

## 2019-04-16 ENCOUNTER — Encounter: Payer: Self-pay | Admitting: Family Medicine

## 2019-04-16 ENCOUNTER — Other Ambulatory Visit: Payer: Self-pay

## 2019-04-16 VITALS — BP 114/74 | HR 75 | Temp 97.0°F | Resp 16 | Ht 66.0 in | Wt 191.0 lb

## 2019-04-16 DIAGNOSIS — G8929 Other chronic pain: Secondary | ICD-10-CM

## 2019-04-16 DIAGNOSIS — M791 Myalgia, unspecified site: Secondary | ICD-10-CM

## 2019-04-16 DIAGNOSIS — M533 Sacrococcygeal disorders, not elsewhere classified: Secondary | ICD-10-CM | POA: Diagnosis not present

## 2019-04-16 DIAGNOSIS — M545 Low back pain, unspecified: Secondary | ICD-10-CM

## 2019-04-16 DIAGNOSIS — Z13220 Encounter for screening for lipoid disorders: Secondary | ICD-10-CM | POA: Diagnosis not present

## 2019-04-16 DIAGNOSIS — R7303 Prediabetes: Secondary | ICD-10-CM | POA: Diagnosis not present

## 2019-04-16 DIAGNOSIS — Z130 Encounter for screening for diseases of the blood and blood-forming organs and certain disorders involving the immune mechanism: Secondary | ICD-10-CM | POA: Diagnosis not present

## 2019-04-16 DIAGNOSIS — M160 Bilateral primary osteoarthritis of hip: Secondary | ICD-10-CM | POA: Diagnosis not present

## 2019-04-16 LAB — COMPREHENSIVE METABOLIC PANEL
ALT: 17 U/L (ref 0–35)
AST: 19 U/L (ref 0–37)
Albumin: 4.2 g/dL (ref 3.5–5.2)
Alkaline Phosphatase: 92 U/L (ref 39–117)
BUN: 17 mg/dL (ref 6–23)
CO2: 28 mEq/L (ref 19–32)
Calcium: 9.8 mg/dL (ref 8.4–10.5)
Chloride: 102 mEq/L (ref 96–112)
Creatinine, Ser: 0.97 mg/dL (ref 0.40–1.20)
GFR: 56.88 mL/min — ABNORMAL LOW (ref >60.00)
Glucose, Bld: 107 mg/dL — ABNORMAL HIGH (ref 70–99)
Potassium: 4.3 mEq/L (ref 3.5–5.1)
Sodium: 137 mEq/L (ref 135–145)
Total Bilirubin: 0.3 mg/dL (ref 0.2–1.2)
Total Protein: 6.8 g/dL (ref 6.0–8.3)

## 2019-04-16 LAB — LIPID PANEL
Cholesterol: 187 mg/dL (ref 0–200)
HDL: 44.5 mg/dL (ref >39.00)
LDL Cholesterol: 122 mg/dL — ABNORMAL HIGH (ref 0–99)
NonHDL: 142.38
Total CHOL/HDL Ratio: 4
Triglycerides: 101 mg/dL (ref 0.0–149.0)
VLDL: 20.2 mg/dL (ref 0.0–40.0)

## 2019-04-16 LAB — CBC
HCT: 40.6 % (ref 36.0–46.0)
Hemoglobin: 13.3 g/dL (ref 12.0–15.0)
MCHC: 32.7 g/dL (ref 30.0–36.0)
MCV: 91.2 fl (ref 78.0–100.0)
Platelets: 275 10*3/uL (ref 150.0–400.0)
RBC: 4.45 Mil/uL (ref 3.87–5.11)
RDW: 12.8 % (ref 11.5–15.5)
WBC: 8.5 10*3/uL (ref 4.0–10.5)

## 2019-04-16 LAB — CK: Total CK: 86 U/L (ref 7–177)

## 2019-04-16 LAB — HEMOGLOBIN A1C: Hgb A1c MFr Bld: 6.3 % (ref 4.6–6.5)

## 2019-04-16 NOTE — Patient Instructions (Signed)
Good to see you again today!  I would encourage you to get the covid vaccine as soon as you are able  I will be in touch with your blood work and x-rays asap

## 2019-04-17 ENCOUNTER — Telehealth: Payer: Self-pay

## 2019-04-17 ENCOUNTER — Encounter: Payer: Self-pay | Admitting: *Deleted

## 2019-04-17 DIAGNOSIS — G8929 Other chronic pain: Secondary | ICD-10-CM

## 2019-04-17 DIAGNOSIS — M4300 Spondylolysis, site unspecified: Secondary | ICD-10-CM

## 2019-04-17 DIAGNOSIS — M545 Low back pain, unspecified: Secondary | ICD-10-CM

## 2019-04-17 NOTE — Telephone Encounter (Signed)
Patient called in needing the nurse or the Dr. To explain her test results from her Lab work and her X-Ray. Please follow up with the patient at [phone] thanks.

## 2019-04-17 NOTE — Telephone Encounter (Signed)
1. Spoke with patient about her labs.  She asked if there are any supplements that will help with cholesterol and prediabetes.  I did send her a handouts on mychart that hopefully will help as well.    2.  She stated that she is interested in CT scan for her back.

## 2019-04-18 NOTE — Telephone Encounter (Signed)
Patient states Dr Lorelei Pont was suppose to request a MRI to be scheduled for her. Patient would like an update.

## 2019-04-18 NOTE — Telephone Encounter (Signed)
Gave her a call, she was concerned that possible pars defect in her spine could be a dangerous injury.  I explained to her that this is more of a chronic stress fracture, would not put her at danger of paralysis or other serious sequelae.  We will set her up for a CT as soon as possible, but does not need to be done emergently.  She is reassured and expressed appreciation  DG Lumbar Spine Complete  Result Date: 04/16/2019 CLINICAL DATA:  Chronic low back pain EXAM: LUMBAR SPINE - COMPLETE 4+ VIEW COMPARISON:  CT 09/17/2016, radiograph 12/18/2015 FINDINGS: Trace anterolisthesis L4 on L5. Vertebral body heights are maintained. Osteophytes at multiple levels. Advanced degeneration at L5-S1 with disc space narrowing, endplate sclerosis, osteophyte and posterior facet degenerative change. Lucency in the region of the pars interarticularis on 1 of the lateral views at L4 though not well demonstrated on oblique views and not visible on prior exams. IMPRESSION: 1. Multiple level degenerative changes, most advanced at L5-S1. 2. Trace anterolisthesis L4 on L5 with questionable pars lucency on one view. CT could be obtained for further evaluation. Electronically Signed   By: Donavan Foil M.D.   On: 04/16/2019 16:59   DG Pelvis 1-2 Views  Result Date: 04/16/2019 CLINICAL DATA:  Chronic bilateral hip pain. No known injury. Initial encounter. EXAM: PELVIS - 1-2 VIEW COMPARISON:  None. FINDINGS: Minimal bilateral hip joint space narrowing noted. No fracture, subluxation or dislocation identified. No focal bony lesions are identified. IMPRESSION: Minimal bilateral hip degenerative changes. Electronically Signed   By: Margarette Canada M.D.   On: 04/16/2019 14:26

## 2019-04-18 NOTE — Telephone Encounter (Signed)
Correction, patient states Dr Lorelei Pont should have order a CT, Back, patient states she had a fracture

## 2019-04-19 IMAGING — DX DG HIP (WITH OR WITHOUT PELVIS) 2-3V*R*
3 series · 3 of 3 positions shown · non-contrast
Comparison: 12/10/2015

CLINICAL DATA: Posterior right hip pain radiates to right leg.

EXAM:
DG HIP (WITH OR WITHOUT PELVIS) 2-3V RIGHT

[pelvis ap]
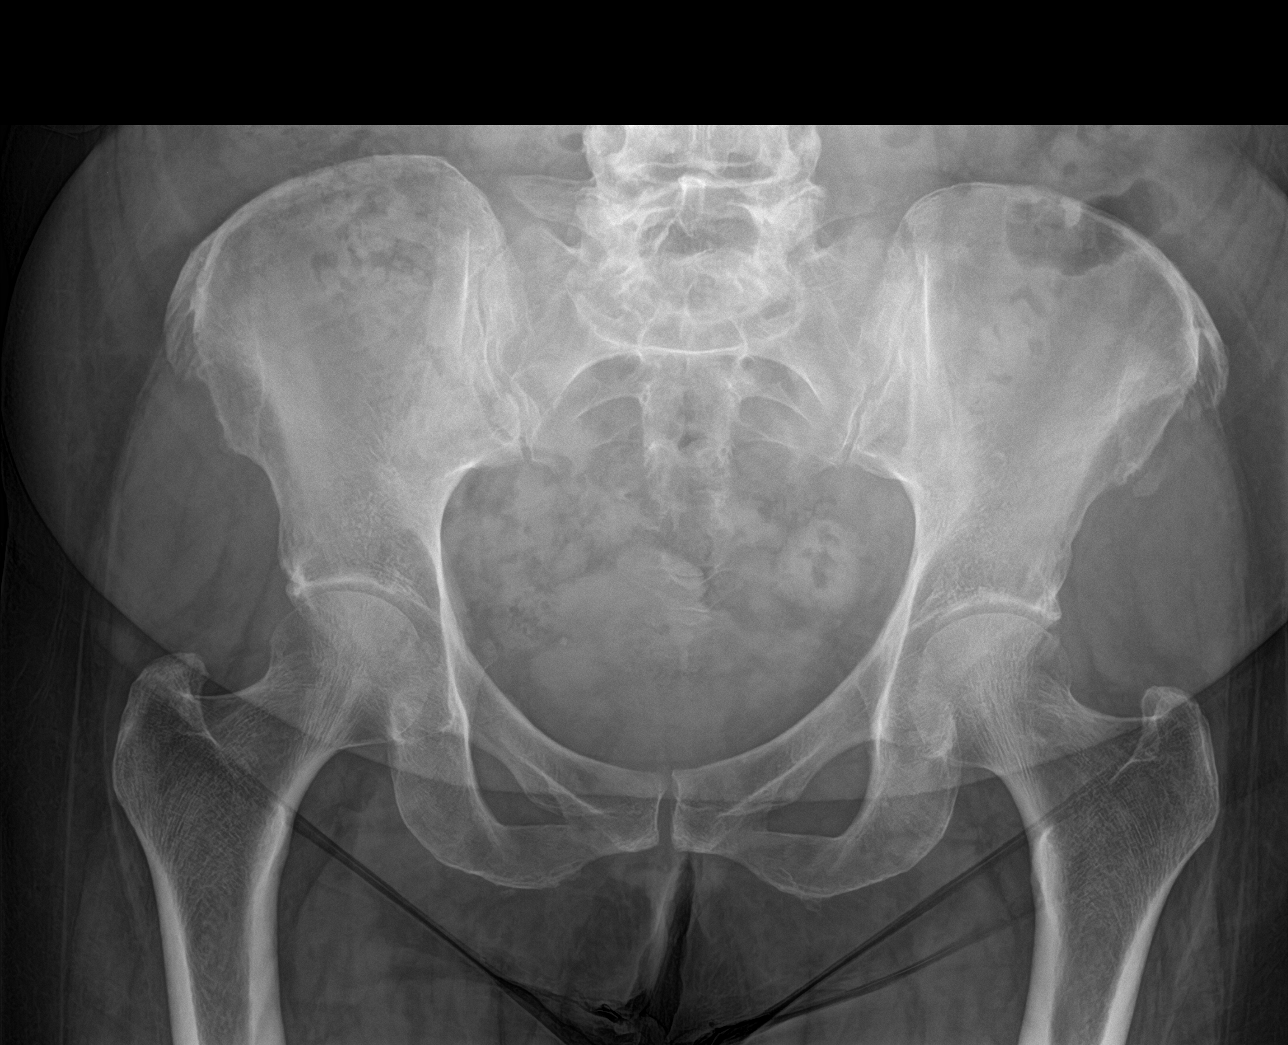

[hip ap]
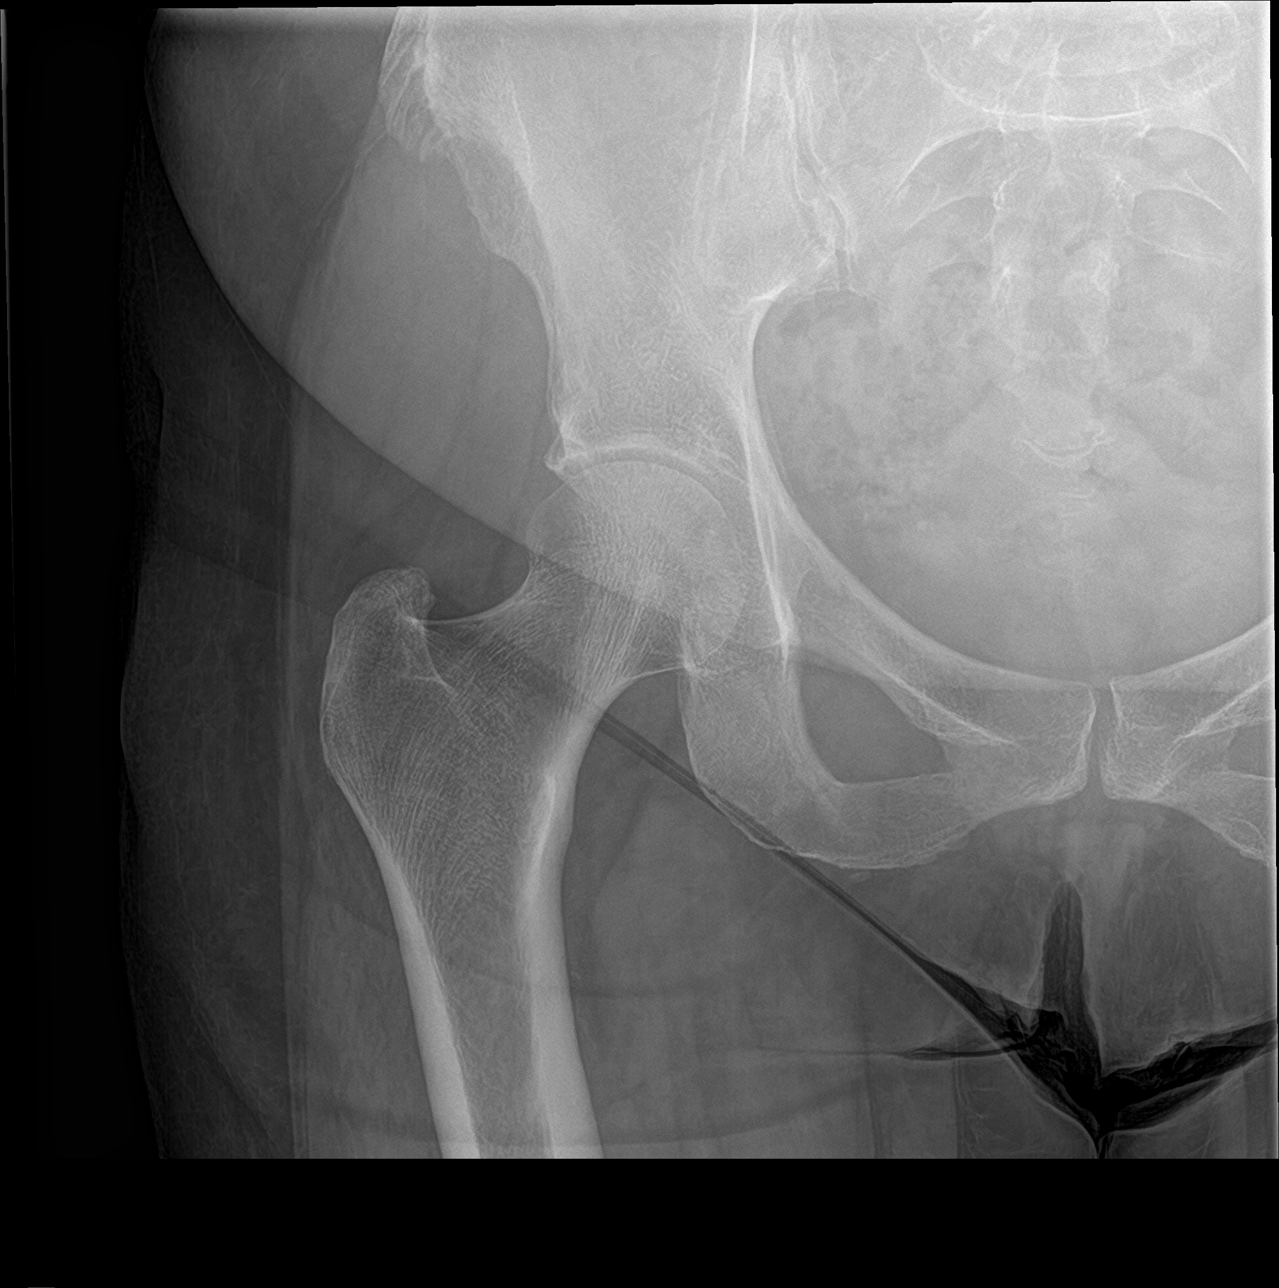

[hip frog leg]
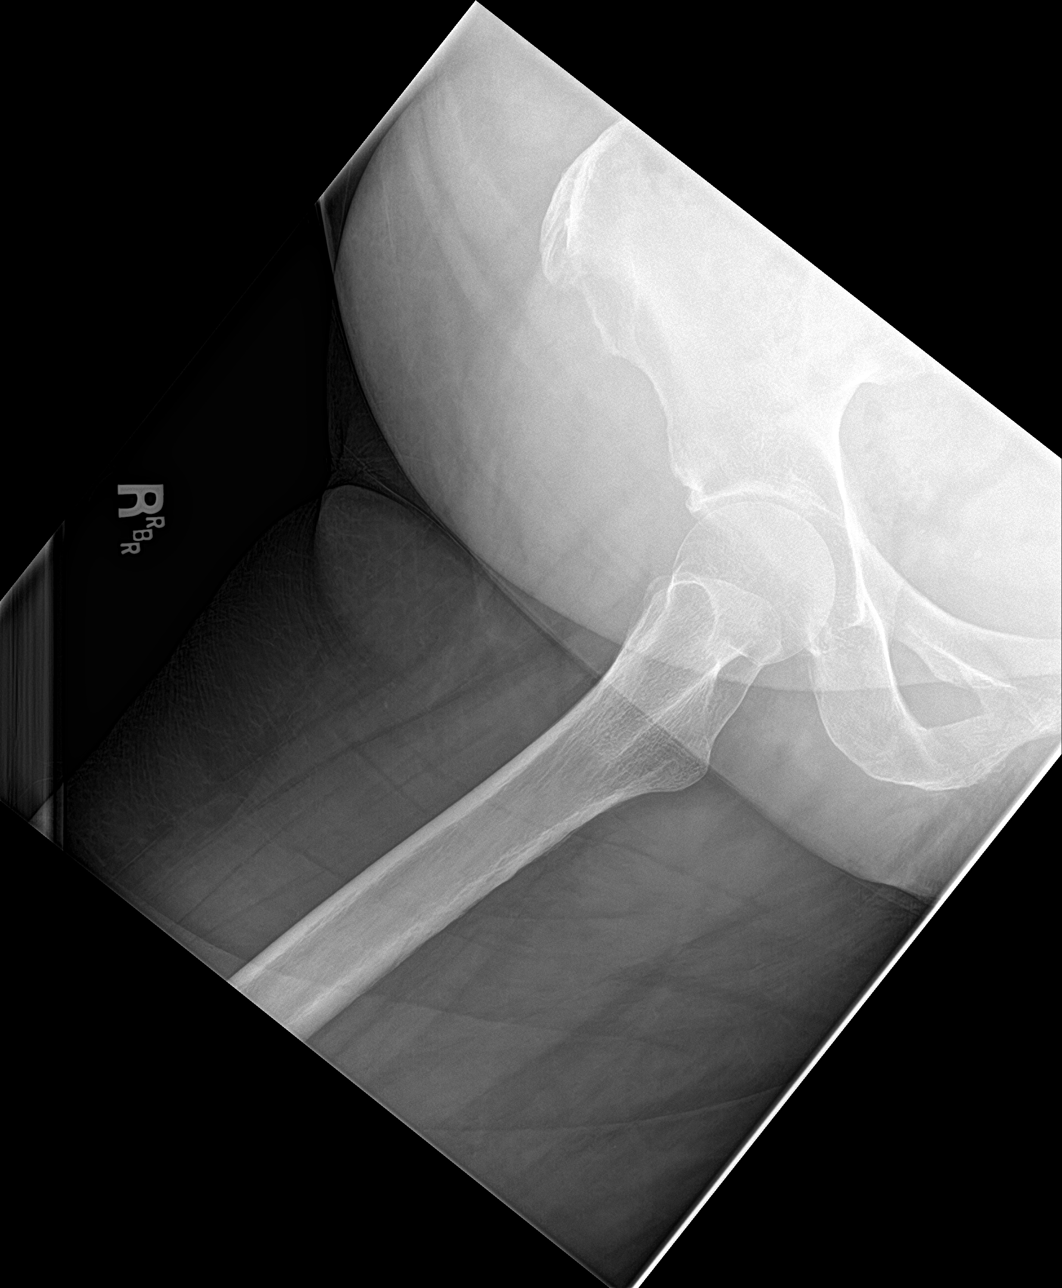

[3 of 3 positions shown; findings below may reference images not displayed]

FINDINGS: No fracture. No subluxation or dislocation. Minimal degenerative
change in the right hip is similar to prior.
IMPRESSION: Mild degeneration right acetabulum.  No acute bony findings.

## 2019-05-10 ENCOUNTER — Ambulatory Visit (HOSPITAL_BASED_OUTPATIENT_CLINIC_OR_DEPARTMENT_OTHER): Payer: Medicare PPO

## 2019-05-14 ENCOUNTER — Other Ambulatory Visit: Payer: Self-pay

## 2019-05-14 ENCOUNTER — Encounter: Payer: Self-pay | Admitting: Family Medicine

## 2019-05-14 ENCOUNTER — Ambulatory Visit (HOSPITAL_BASED_OUTPATIENT_CLINIC_OR_DEPARTMENT_OTHER)
Admission: RE | Admit: 2019-05-14 | Discharge: 2019-05-14 | Disposition: A | Discharge status: Home / Self Care | Payer: Medicare PPO | Source: Ambulatory Visit | Attending: Family Medicine | Admitting: Family Medicine

## 2019-05-14 DIAGNOSIS — M545 Low back pain, unspecified: Secondary | ICD-10-CM

## 2019-05-14 DIAGNOSIS — M47816 Spondylosis without myelopathy or radiculopathy, lumbar region: Secondary | ICD-10-CM

## 2019-05-14 DIAGNOSIS — G8929 Other chronic pain: Secondary | ICD-10-CM | POA: Diagnosis not present

## 2019-05-14 DIAGNOSIS — M4300 Spondylolysis, site unspecified: Secondary | ICD-10-CM | POA: Diagnosis not present

## 2019-05-15 ENCOUNTER — Telehealth: Payer: Self-pay

## 2019-05-15 NOTE — Telephone Encounter (Signed)
Patient called in to see if Dr. Lorelei Pont or the nurse could give her a call to discuss her MRI results. Please follow up with the patient at [phone]   Thanks,

## 2019-05-16 NOTE — Telephone Encounter (Signed)
Tried calling patient, no answer, voicemail full.

## 2019-05-16 NOTE — Telephone Encounter (Signed)
Pt called back, requesting a call back

## 2019-05-17 NOTE — Telephone Encounter (Signed)
Patient called back today, states that she had to adjust her notifications on her phone. patient is requesting a call back

## 2019-05-22 NOTE — Telephone Encounter (Signed)
Pt calling to see what her next level of care due to her results on her MRI.Maria [name] She's not sure if she should see [facility] for further treatment or if she needs to be referred to  A specialist. Please advise. Thanks

## 2019-06-02 ENCOUNTER — Encounter: Payer: Self-pay | Admitting: Family Medicine

## 2019-06-06 DIAGNOSIS — M545 Low back pain: Secondary | ICD-10-CM | POA: Diagnosis not present

## 2019-06-06 DIAGNOSIS — M47816 Spondylosis without myelopathy or radiculopathy, lumbar region: Secondary | ICD-10-CM | POA: Diagnosis not present

## 2019-06-06 DIAGNOSIS — M549 Dorsalgia, unspecified: Secondary | ICD-10-CM | POA: Diagnosis not present

## 2019-06-06 DIAGNOSIS — M4316 Spondylolisthesis, lumbar region: Secondary | ICD-10-CM | POA: Diagnosis not present

## 2019-06-18 DIAGNOSIS — M6281 Muscle weakness (generalized): Secondary | ICD-10-CM | POA: Diagnosis not present

## 2019-06-18 DIAGNOSIS — M545 Low back pain: Secondary | ICD-10-CM | POA: Diagnosis not present

## 2019-06-18 DIAGNOSIS — M4316 Spondylolisthesis, lumbar region: Secondary | ICD-10-CM | POA: Diagnosis not present

## 2019-07-10 ENCOUNTER — Other Ambulatory Visit: Payer: Self-pay

## 2019-07-10 NOTE — Progress Notes (Signed)
[place] [facility] [address] [place], [place], [place] [address] [phone]  Date:  07/11/2019   Name:  Maria [name]   DOB:  1950/06/10   MRN:  [id number]  PCP:  [name], MD    Chief Complaint: Rash (breast right side, 7 days, spot on her back)   History of Present Illness:  Maria [name] is a 69 y.o. very pleasant female patient who presents with the following:  Patient with history of prediabetes, GERD, elevated blood pressure  Here today with concern of a rash on her right breast and on her right back-appeared about 1 week ago She is not aware of any skin irritation or other cause of the rash Not painful It was itchy and inflamed, but now seems to have calmed down   She got 1 dose of Shingrix, she is not certain if she received her second dose  Most recent labs completed in January Her last mammo was 2 years ago -she is due for repeat now  We did a CT of her spine earlier this spring which showed significant arthritis.  I referred her to nonoperative spine Ortho at that time  She also noted some cramping in her calves over the last few nights, she has tried to cut down on her junk food and has stopped going to [employer].  Otherwise she is not having any cramping or muscle pain  She also mentions an occasional feeling of "something moving around" in her chest, this is not chest pain or pressure She has never been a smoker  Patient Active Problem List   Diagnosis Date Noted  . GERD (gastroesophageal reflux disease) 12/07/2016  . Prediabetes 10/13/2015  . Reflux esophagitis 11/20/2014  . NONSPECIFIC ABN FINDING RAD & OTH EXAM GI TRACT 09/19/2009    Past Medical History:  Diagnosis Date  . Arthritis   . Colon polyps   . Diverticulosis   . GERD (gastroesophageal reflux disease)   . Hemorrhoids, external without complications   . Hemorrhoids, internal   . Hernia, diaphragmatic, without obstruction   .  HH (hiatus hernia) 2014  . Hypertension   . Reflux esophagitis   . Tubular adenoma of colon     Past Surgical History:  Procedure Laterality Date  . COLONOSCOPY    . ESOPHAGEAL MANOMETRY N/A 12/13/2014   Procedure: ESOPHAGEAL MANOMETRY (EM);  Surgeon: Carol Ada, MD;  Location: [facility];  Service: Endoscopy;  Laterality: N/A;  . POLYPECTOMY    . SPINE SURGERY     Neck  . UPPER GASTROINTESTINAL ENDOSCOPY      Social History   Tobacco Use  . Smoking status: Never Smoker  . Smokeless tobacco: Never Used  Substance Use Topics  . Alcohol use: No    Alcohol/week: 0.0 standard drinks  . Drug use: No    Family History  Problem Relation Age of Onset  . Hypertension Father   . Arthritis Brother   . Colon cancer Neg Hx   . Rectal cancer Neg Hx   . Esophageal cancer Neg Hx   . Stomach cancer Neg Hx   . Pancreatic cancer Neg Hx   . Prostate cancer Neg Hx     No Known Allergies  Medication list has been reviewed and updated.  Current Outpatient Medications on File Prior to Visit  Medication Sig Dispense Refill  . b complex vitamins tablet Take 1 tablet by mouth daily.    Marland Kitchen  celecoxib (CELEBREX) 100 MG capsule Take 1 capsule (100 mg total) by mouth 2 (two) times daily. Use as needed for arthritis pain 60 capsule 6  . Multiple Vitamin (MULTIVITAMIN) tablet Take 1 tablet by mouth daily.    Marland Kitchen omeprazole (PRILOSEC) 40 MG capsule Take 1 capsule (40 mg total) by mouth daily. 90 capsule 3   No current facility-administered medications on file prior to visit.    Review of Systems:  As per HPI- otherwise negative.   Physical Examination: Vitals:   07/11/19 1115  BP: 140/77  Pulse: 63  Resp: 15  Temp: 97.9 F (36.6 C)  SpO2: 98%   Vitals:   07/11/19 1115  Weight: 188 lb (85.3 kg)  Height: 5\' 6"  (1.676 m)   Body mass index is 30.34 kg/m. Ideal Body Weight: Weight in (lb) to have BMI = 25: 154.6  GEN: no acute distress.  Obese, looks well HEENT: Atraumatic,  Normocephalic.  Ears and Nose: No external deformity. CV: RRR, No M/G/R. No JVD. No thrill. No extra heart sounds. PULM: CTA B, no wheezes, crackles, rhonchi. No retractions. No resp. distress. No accessory muscle use. ABD: S, NT, ND, +BS. No rebound. No HSM. EXTR: No c/c/e PSYCH: Normally interactive. Conversant.  Macromastia She has a small area of skin outbreak superior to the right nipple, and also in the same dermatome on her right sided thoracic back.  This appears consistent with a mild shingles outbreak No breast or axillary masses noted Calves are soft and nontender to palpation   Assessment and Plan: Herpes zoster without complication - Plan: valACYclovir (VALTREX) 1000 MG tablet  Breast cancer screening by mammogram - Plan: MM 3D SCREEN BREAST BILATERAL  Chest congestion - Plan: DG Chest 2 View  Here today with likely shingles on her chest, T3 or T4 dermatome Offered Valtrex, she would like to use this Encouraged her to have second dose of Shingrix once her rash is healed Also due for a mammogram, this is ordered today.  I have asked her to get this done once her rash is healed to avoid unnecessary confusion.  If the rash does not heal within a couple of weeks she is to let me know  Ordered a chest film for nonspecific sensation of "something moving around in my chest" She also has noted some cramping in her calves at night just recently.  This may be due to diet changes, she has stopped going [employer].  I encouraged her to increase dietary potassium and let me know if this does not resolve Moderate medical decision making today This visit occurred during the SARS-CoV-2 public health emergency.  Safety protocols were in place, including screening questions prior to the visit, additional usage of staff PPE, and extensive cleaning of exam room while observing appropriate contact time as indicated for disinfecting solutions.    Signed Lamar Blinks, MD

## 2019-07-10 NOTE — Patient Instructions (Addendum)
It was great to see you again today, please be sure to get your COVID-19 vaccine and second dose of Shingrix if not done already  I think that your rash is due to shingles- we will treat this with valtrex for one week.  Once healed, please get a mammogram asap You can also get another dose of Shingrix shingles vaccine once healed if you like  Please go by [facility] off of [address] for a chest x-ray at your earliest convenience  Increase your dietary potassium intake- let me know if this does not help with muscle cramps

## 2019-07-11 ENCOUNTER — Other Ambulatory Visit: Payer: Self-pay

## 2019-07-11 ENCOUNTER — Ambulatory Visit: Payer: Medicare PPO | Admitting: Family Medicine

## 2019-07-11 ENCOUNTER — Encounter: Payer: Self-pay | Admitting: Family Medicine

## 2019-07-11 VITALS — BP 140/77 | HR 63 | Temp 97.9°F | Resp 15 | Ht 66.0 in | Wt 188.0 lb

## 2019-07-11 DIAGNOSIS — R0989 Other specified symptoms and signs involving the circulatory and respiratory systems: Secondary | ICD-10-CM | POA: Diagnosis not present

## 2019-07-11 DIAGNOSIS — Z1231 Encounter for screening mammogram for malignant neoplasm of breast: Secondary | ICD-10-CM | POA: Diagnosis not present

## 2019-07-11 DIAGNOSIS — B029 Zoster without complications: Secondary | ICD-10-CM

## 2019-07-11 MED ORDER — VALACYCLOVIR HCL 1 G PO TABS: 1000.0000 mg | ORAL_TABLET | Freq: Three times a day (TID) | ORAL | 0 refills | Status: DC

## 2019-07-19 ENCOUNTER — Other Ambulatory Visit: Payer: Self-pay

## 2019-07-19 ENCOUNTER — Ambulatory Visit (HOSPITAL_BASED_OUTPATIENT_CLINIC_OR_DEPARTMENT_OTHER)
Admission: RE | Admit: 2019-07-19 | Discharge: 2019-07-19 | Disposition: A | Discharge status: Home / Self Care | Payer: Medicare PPO | Source: Ambulatory Visit | Attending: Family Medicine | Admitting: Family Medicine

## 2019-07-19 DIAGNOSIS — Z1231 Encounter for screening mammogram for malignant neoplasm of breast: Secondary | ICD-10-CM | POA: Diagnosis not present

## 2019-07-20 ENCOUNTER — Ambulatory Visit: Payer: Medicare PPO | Attending: Internal Medicine

## 2019-07-20 ENCOUNTER — Telehealth: Payer: Self-pay | Admitting: Family Medicine

## 2019-07-20 DIAGNOSIS — Z23 Encounter for immunization: Secondary | ICD-10-CM

## 2019-07-20 NOTE — Progress Notes (Signed)
   Covid-19 Vaccination Clinic  Name:  Maria [name]    MRN: [id number] DOB: 1951-03-22  07/20/2019  [name] was observed post Covid-19 immunization for 15 minutes without incident. She was provided with Vaccine Information Sheet and instruction to access the V-Safe system.   [name] was instructed to call 911 with any severe reactions post vaccine: Marland Kitchen Difficulty breathing  . Swelling of face and throat  . A fast heartbeat  . A bad rash all over body  . Dizziness and weakness   Immunizations Administered    Name Date Dose VIS Date Route   Pfizer COVID-19 Vaccine 07/20/2019  3:03 PM 0.3 mL 05/16/2018 Intramuscular   Manufacturer: [place]   Lot: [id number]   Corder: [id number]

## 2019-07-20 NOTE — Telephone Encounter (Signed)
Called patient, advised her she should be fine to take the vaccine this afternoon. Answered all questions. Patient verbalized understanding.

## 2019-07-20 NOTE — Telephone Encounter (Signed)
Caller : Maria [name]  Call Back # [phone]  Subject : Covid Vaccine   Patient is concern that her Covid vaccine will interfere with he appointment with [facility] on Monday.Patient states that she has a vein problem and she thinks that it will interfere with that.

## 2019-07-22 NOTE — Progress Notes (Deleted)
[facility] [address] [place], [place] [address] [id number]  Date:  07/23/2019   Name:  Maria [name]   DOB:  12/16/1950   MRN:  [id number]  PCP:  [name], MD    Chief Complaint: No chief complaint on file.   History of Present Illness:  Maria [name] is a 69 y.o. very pleasant female patient who presents with the following:  Patient with history of GERD, prediabetes Here today with concern of leg and hip pain Last seen by myself on April 21 with shingles on her right trunk At her visit she did not mention leg, hip or back pain, but wrote in later that day wondering if this pain was related to her shingles.  Advised her that it likely was not as this is a different dermatome  Patient Active Problem List   Diagnosis Date Noted  . GERD (gastroesophageal reflux disease) 12/07/2016  . Prediabetes 10/13/2015  . Reflux esophagitis 11/20/2014  . NONSPECIFIC ABN FINDING RAD & OTH EXAM GI TRACT 09/19/2009    Past Medical History:  Diagnosis Date  . Arthritis   . Colon polyps   . Diverticulosis   . GERD (gastroesophageal reflux disease)   . Hemorrhoids, external without complications   . Hemorrhoids, internal   . Hernia, diaphragmatic, without obstruction   . HH (hiatus hernia) 2014  . Hypertension   . Reflux esophagitis   . Tubular adenoma of colon     Past Surgical History:  Procedure Laterality Date  . COLONOSCOPY    . ESOPHAGEAL MANOMETRY N/A 12/13/2014   Procedure: ESOPHAGEAL MANOMETRY (EM);  Surgeon: Carol Ada, MD;  Location: [facility];  Service: Endoscopy;  Laterality: N/A;  . POLYPECTOMY    . SPINE SURGERY     Neck  . UPPER GASTROINTESTINAL ENDOSCOPY      Social History   Tobacco Use  . Smoking status: Never Smoker  . Smokeless tobacco: Never Used  Substance Use Topics  . Alcohol use: No    Alcohol/week: 0.0 standard drinks  . Drug use: No    Family History  Problem  Relation Age of Onset  . Hypertension Father   . Arthritis Brother   . Colon cancer Neg Hx   . Rectal cancer Neg Hx   . Esophageal cancer Neg Hx   . Stomach cancer Neg Hx   . Pancreatic cancer Neg Hx   . Prostate cancer Neg Hx     No Known Allergies  Medication list has been reviewed and updated.  Current Outpatient Medications on File Prior to Visit  Medication Sig Dispense Refill  . b complex vitamins tablet Take 1 tablet by mouth daily.    . celecoxib (CELEBREX) 100 MG capsule Take 1 capsule (100 mg total) by mouth 2 (two) times daily. Use as needed for arthritis pain 60 capsule 6  . Multiple Vitamin (MULTIVITAMIN) tablet Take 1 tablet by mouth daily.    Marland Kitchen omeprazole (PRILOSEC) 40 MG capsule Take 1 capsule (40 mg total) by mouth daily. 90 capsule 3  . valACYclovir (VALTREX) 1000 MG tablet Take 1 tablet (1,000 mg total) by mouth 3 (three) times daily. 21 tablet 0   No current facility-administered medications on file prior to visit.    Review of Systems:  As per HPI- otherwise negative.   Physical Examination: There were no vitals filed for this visit. There were no vitals filed for this visit. There is  no height or weight on file to calculate BMI. Ideal Body Weight:    GEN: no acute distress. HEENT: Atraumatic, Normocephalic.  Ears and Nose: No external deformity. CV: RRR, No M/G/R. No JVD. No thrill. No extra heart sounds. PULM: CTA B, no wheezes, crackles, rhonchi. No retractions. No resp. distress. No accessory muscle use. ABD: S, NT, ND, +BS. No rebound. No HSM. EXTR: No c/c/e PSYCH: Normally interactive. Conversant.    Assessment and Plan: *** This visit occurred during the SARS-CoV-2 public health emergency.  Safety protocols were in place, including screening questions prior to the visit, additional usage of staff PPE, and extensive cleaning of exam room while observing appropriate contact time as indicated for disinfecting solutions.    Signed Lamar Blinks, MD

## 2019-07-23 ENCOUNTER — Ambulatory Visit: Payer: Medicare PPO | Admitting: Family Medicine

## 2019-08-13 ENCOUNTER — Ambulatory Visit: Payer: Medicare PPO | Attending: Internal Medicine

## 2019-08-13 DIAGNOSIS — Z23 Encounter for immunization: Secondary | ICD-10-CM

## 2019-08-23 ENCOUNTER — Other Ambulatory Visit: Payer: Self-pay

## 2019-08-23 ENCOUNTER — Ambulatory Visit: Payer: Medicare PPO | Admitting: Physician Assistant

## 2019-08-23 VITALS — BP 147/90 | HR 69 | Temp 98.2°F | Resp 18 | Ht 65.0 in | Wt 184.0 lb

## 2019-08-23 DIAGNOSIS — E669 Obesity, unspecified: Secondary | ICD-10-CM

## 2019-08-23 DIAGNOSIS — I10 Essential (primary) hypertension: Secondary | ICD-10-CM

## 2019-08-23 MED ORDER — HYDROCHLOROTHIAZIDE 12.5 MG PO TABS: 12.5000 mg | ORAL_TABLET | Freq: Every day | ORAL | 3 refills | Status: DC

## 2019-08-23 NOTE — Patient Instructions (Addendum)
The medication that you are going to be starting is called hydrochlorothiazide, 12.5 mg, you will take this in the morning.  I encourage you to check your blood pressure on a daily basis, keep a log.  I encourage you to return to our mobile unit next week for repeat blood pressure check.  I encourage you to follow a low sodium diet.  I encourage you to take a daily vitamin, including at least 2000 units of vitamin D.  Increase water intake.  Thank you for trusting [place] with your care!  Kennieth Rad, PA-C Physician Assistant [place] [email or web address]          DASH Eating Plan DASH stands for "Dietary Approaches to Stop Hypertension." The DASH eating plan is a healthy eating plan that has been shown to reduce high blood pressure (hypertension). It may also reduce your risk for type 2 diabetes, heart disease, and stroke. The DASH eating plan may also help with weight loss. What are tips for following this plan?  General guidelines  Avoid eating more than 2,300 mg (milligrams) of salt (sodium) a day. If you have hypertension, you may need to reduce your sodium intake to 1,500 mg a day.  Limit alcohol intake to no more than 1 drink a day for nonpregnant women and 2 drinks a day for men. One drink equals 12 oz of beer, 5 oz of wine, or 1 oz of hard liquor.  Work with your health care provider to maintain a healthy body weight or to lose weight. Ask what an ideal weight is for you.  Get at least 30 minutes of exercise that causes your heart to beat faster (aerobic exercise) most days of the week. Activities may include walking, swimming, or biking.  Work with your health care provider or diet and nutrition specialist (dietitian) to adjust your eating plan to your individual calorie needs. Reading food labels   Check food labels for the amount of sodium per serving. Choose foods with less than 5 percent of the Daily Value of  sodium. Generally, foods with less than 300 mg of sodium per serving fit into this eating plan.  To find whole grains, look for the word "whole" as the first word in the ingredient list. Shopping  Buy products labeled as "low-sodium" or "no salt added."  Buy fresh foods. Avoid canned foods and premade or frozen meals. Cooking  Avoid adding salt when cooking. Use salt-free seasonings or herbs instead of table salt or sea salt. Check with your health care provider or pharmacist before using salt substitutes.  Do not fry foods. Cook foods using healthy methods such as baking, boiling, grilling, and broiling instead.  Cook with heart-healthy oils, such as olive, canola, soybean, or sunflower oil. Meal planning  Eat a balanced diet that includes: ? 5 or more servings of fruits and vegetables each day. At each meal, try to fill half of your plate with fruits and vegetables. ? Up to 6-8 servings of whole grains each day. ? Less than 6 oz of lean meat, poultry, or fish each day. A 3-oz serving of meat is about the same size as a deck of cards. One egg equals 1 oz. ? 2 servings of low-fat dairy each day. ? A serving of nuts, seeds, or beans 5 times each week. ? Heart-healthy fats. Healthy fats called Omega-3 fatty acids are found in foods such as flaxseeds and coldwater fish, like sardines, salmon, and mackerel.  Limit how much you eat  of the following: ? Canned or prepackaged foods. ? Food that is high in trans fat, such as fried foods. ? Food that is high in saturated fat, such as fatty meat. ? Sweets, desserts, sugary drinks, and other foods with added sugar. ? Full-fat dairy products.  Do not salt foods before eating.  Try to eat at least 2 vegetarian meals each week.  Eat more home-cooked food and less restaurant, buffet, and fast food.  When eating at a restaurant, ask that your food be prepared with less salt or no salt, if possible. What foods are recommended? The items listed  may not be a complete list. Talk with your dietitian about what dietary choices are best for you. Grains Whole-grain or whole-wheat bread. Whole-grain or whole-wheat pasta. Brown rice. [place]row. Bulgur. Whole-grain and low-sodium cereals. Pita bread. Low-fat, low-sodium crackers. Whole-wheat flour tortillas. Vegetables Fresh or frozen vegetables (raw, steamed, roasted, or grilled). Low-sodium or reduced-sodium tomato and vegetable juice. Low-sodium or reduced-sodium tomato sauce and tomato paste. Low-sodium or reduced-sodium canned vegetables. Fruits All fresh, dried, or frozen fruit. Canned fruit in natural juice (without added sugar). Meat and other protein foods Skinless chicken or [place]. Ground chicken or [place]. Pork with fat trimmed off. Fish and seafood. Egg whites. Dried beans, peas, or lentils. Unsalted nuts, nut butters, and seeds. Unsalted canned beans. Lean cuts of beef with fat trimmed off. Low-sodium, lean deli meat. Dairy Low-fat (1%) or fat-free (skim) milk. Fat-free, low-fat, or reduced-fat cheeses. Nonfat, low-sodium ricotta or cottage cheese. Low-fat or nonfat yogurt. Low-fat, low-sodium cheese. Fats and oils Soft margarine without trans fats. Vegetable oil. Low-fat, reduced-fat, or light mayonnaise and salad dressings (reduced-sodium). Canola, safflower, olive, soybean, and sunflower oils. Avocado. Seasoning and other foods Herbs. Spices. Seasoning mixes without salt. Unsalted popcorn and pretzels. Fat-free sweets. What foods are not recommended? The items listed may not be a complete list. Talk with your dietitian about what dietary choices are best for you. Grains Baked goods made with fat, such as croissants, muffins, or some breads. Dry pasta or rice meal packs. Vegetables Creamed or fried vegetables. Vegetables in a cheese sauce. Regular canned vegetables (not low-sodium or reduced-sodium). Regular canned tomato sauce and paste (not low-sodium or reduced-sodium).  Regular tomato and vegetable juice (not low-sodium or reduced-sodium). Angie Fava. Olives. Fruits Canned fruit in a light or heavy syrup. Fried fruit. Fruit in cream or butter sauce. Meat and other protein foods Fatty cuts of meat. Ribs. Fried meat. Berniece Salines. Sausage. Bologna and other processed lunch meats. Salami. Fatback. Hotdogs. Bratwurst. Salted nuts and seeds. Canned beans with added salt. Canned or smoked fish. Whole eggs or egg yolks. Chicken or [place] with skin. Dairy Whole or 2% milk, cream, and half-and-half. Whole or full-fat cream cheese. Whole-fat or sweetened yogurt. Full-fat cheese. Nondairy creamers. Whipped toppings. Processed cheese and cheese spreads. Fats and oils Butter. Stick margarine. Lard. Shortening. Ghee. Bacon fat. Tropical oils, such as coconut, palm kernel, or palm oil. Seasoning and other foods Salted popcorn and pretzels. Onion salt, garlic salt, seasoned salt, table salt, and sea salt. Worcestershire sauce. Tartar sauce. Barbecue sauce. Teriyaki sauce. Soy sauce, including reduced-sodium. Steak sauce. Canned and packaged gravies. Fish sauce. Oyster sauce. Cocktail sauce. Horseradish that you find on the shelf. Ketchup. Mustard. Meat flavorings and tenderizers. Bouillon cubes. Hot sauce and Tabasco sauce. Premade or packaged marinades. Premade or packaged taco seasonings. Relishes. Regular salad dressings. Where to find more information:  National Heart, Lung, and Blood Institute: https://wilson-eaton.com/  American Heart  Association: www.heart.org Summary  The DASH eating plan is a healthy eating plan that has been shown to reduce high blood pressure (hypertension). It may also reduce your risk for type 2 diabetes, heart disease, and stroke.  With the DASH eating plan, you should limit salt (sodium) intake to 2,300 mg a day. If you have hypertension, you may need to reduce your sodium intake to 1,500 mg a day.  When on the DASH eating plan, aim to eat more fresh fruits and  vegetables, whole grains, lean proteins, low-fat dairy, and heart-healthy fats.  Work with your health care provider or diet and nutrition specialist (dietitian) to adjust your eating plan to your individual calorie needs. This information is not intended to replace advice given to you by your health care provider. Make sure you discuss any questions you have with your health care provider. Document Revised: 02/18/2017 Document Reviewed: 03/01/2016 Elsevier Patient Education  2020 [employer].

## 2019-08-23 NOTE — Progress Notes (Signed)
New Patient Office Visit  Subjective:  Patient ID: Maria [name], female    DOB: Nov 30, 1950  Age: 69 y.o. MRN: [id number]  CC:  Chief Complaint  Patient presents with  . Hypertension    HPI Maria [name] reports that she has had several different readings of elevated blood pressure, has never been diagnosed with hypertension.  Reports that she has been working on improving her diet, does endorse having soda, sweet tea, occasional hot dog, occasional animal skins.  Denies any hypertensive symptoms.    Past Medical History:  Diagnosis Date  . Arthritis   . Colon polyps   . Diverticulosis   . GERD (gastroesophageal reflux disease)   . Hemorrhoids, external without complications   . Hemorrhoids, internal   . Hernia, diaphragmatic, without obstruction   . HH (hiatus hernia) 2014  . Hypertension   . Reflux esophagitis   . Tubular adenoma of colon     Past Surgical History:  Procedure Laterality Date  . COLONOSCOPY    . ESOPHAGEAL MANOMETRY N/A 12/13/2014   Procedure: ESOPHAGEAL MANOMETRY (EM);  Surgeon: Carol Ada, MD;  Location: [facility];  Service: Endoscopy;  Laterality: N/A;  . POLYPECTOMY    . SPINE SURGERY     Neck  . UPPER GASTROINTESTINAL ENDOSCOPY      Family History  Problem Relation Age of Onset  . Hypertension Father   . Arthritis Brother   . Colon cancer Neg Hx   . Rectal cancer Neg Hx   . Esophageal cancer Neg Hx   . Stomach cancer Neg Hx   . Pancreatic cancer Neg Hx   . Prostate cancer Neg Hx     Social History   Socioeconomic History  . Marital status: Widowed    Spouse name: Not on file  . Number of children: 3  . Years of education: 8  . Highest education level: Not on file  Occupational History  . Occupation: Retired  Tobacco Use  . Smoking status: Never Smoker  . Smokeless tobacco: Never Used  Substance and Sexual Activity  . Alcohol use: No    Alcohol/week: 0.0 standard drinks  . Drug use: No  . Sexual activity: Never   Other Topics Concern  . Not on file  Social History Narrative  . Not on file   Social Determinants of Health   Financial Resource Strain: Low Risk   . Difficulty of Paying Living Expenses: Not hard at all  Food Insecurity:   . Worried About Charity fundraiser in the Last Year:   . Arboriculturist in the Last Year:   Transportation Needs:   . Film/video editor (Medical):   Marland Kitchen Lack of Transportation (Non-Medical):   Physical Activity:   . Days of Exercise per Week:   . Minutes of Exercise per Session:   Stress:   . Feeling of Stress :   Social Connections:   . Frequency of Communication with Friends and Family:   . Frequency of Social Gatherings with Friends and Family:   . Attends Religious Services:   . Active Member of Clubs or Organizations:   . Attends Archivist Meetings:   Marland Kitchen Marital Status:   Intimate Partner Violence:   . Fear of Current or Ex-Partner:   . Emotionally Abused:   Marland Kitchen Physically Abused:   . Sexually Abused:     ROS Review of Systems  Constitutional: Negative.   HENT: Negative.   Eyes: Negative.   Respiratory: Negative.  Cardiovascular: Negative.   Gastrointestinal: Negative.   Endocrine: Negative.   Genitourinary: Negative.   Musculoskeletal: Negative.   Allergic/Immunologic: Negative.   Neurological: Negative.   Hematological: Negative.   Psychiatric/Behavioral: Negative.     Objective:   Today's Vitals: BP (!) 147/90 (BP Location: Left Arm, Patient Position: Sitting, Cuff Size: Normal)   Pulse 69   Temp 98.2 F (36.8 C) (Oral)   Resp 18   Ht 5\' 5"  (1.651 m)   Wt 184 lb (83.5 kg)   SpO2 97%   BMI 30.62 kg/m   Physical Exam Vitals and nursing note reviewed.  Constitutional:      Appearance: Normal appearance. She is obese.  HENT:     Head: Normocephalic and atraumatic.     Right Ear: External ear normal.     Left Ear: External ear normal.     Nose: Nose normal.     Mouth/Throat:     Mouth: Mucous membranes  are moist.     Pharynx: Oropharynx is clear.  Eyes:     Extraocular Movements: Extraocular movements intact.     Conjunctiva/sclera: Conjunctivae normal.     Pupils: Pupils are equal, round, and reactive to light.  Cardiovascular:     Rate and Rhythm: Normal rate and regular rhythm.     Pulses: Normal pulses.     Heart sounds: Normal heart sounds.  Abdominal:     General: Abdomen is flat. Bowel sounds are normal.     Palpations: Abdomen is soft.  Musculoskeletal:        General: Normal range of motion.     Cervical back: Normal range of motion and neck supple.  Skin:    General: Skin is warm and dry.  Neurological:     General: No focal deficit present.     Mental Status: She is alert and oriented to person, place, and time.  Psychiatric:        Mood and Affect: Mood normal.        Behavior: Behavior normal.        Thought Content: Thought content normal.        Judgment: Judgment normal.     Assessment & Plan:   Problem List Items Addressed This Visit    None    Visit Diagnoses    Essential hypertension    -  Primary   Relevant Medications   hydrochlorothiazide (HYDRODIURIL) 12.5 MG tablet   Other Relevant Orders   TSH   Obesity, Class I, BMI 30-34.9          Outpatient Encounter Medications as of 08/23/2019  Medication Sig  . b complex vitamins tablet Take 1 tablet by mouth daily.  . celecoxib (CELEBREX) 100 MG capsule Take 1 capsule (100 mg total) by mouth 2 (two) times daily. Use as needed for arthritis pain  . Multiple Vitamin (MULTIVITAMIN) tablet Take 1 tablet by mouth daily.  Marland Kitchen omeprazole (PRILOSEC) 40 MG capsule Take 1 capsule (40 mg total) by mouth daily.  . valACYclovir (VALTREX) 1000 MG tablet Take 1 tablet (1,000 mg total) by mouth 3 (three) times daily.  . hydrochlorothiazide (HYDRODIURIL) 12.5 MG tablet Take 1 tablet (12.5 mg total) by mouth daily.   No facility-administered encounter medications on file as of 08/23/2019.  1. Essential  hypertension Encouraged low-sodium diet, gave patient education on increasing water intake, checking blood pressure on daily basis, keeping log.  Patient to return to mobile unit in 1 week for repeat blood pressure check. - hydrochlorothiazide (HYDRODIURIL)  12.5 MG tablet; Take 1 tablet (12.5 mg total) by mouth daily.  Dispense: 90 tablet; Refill: 3 - TSH  2. Obesity, Class I, BMI 30-34.9   I have reviewed the patient's medical history (PMH, PSH, Social History, Family History, Medications, and allergies) , and have been updated if relevant. I spent 30 minutes reviewing chart and  face to face time with patient.     Follow-up: Return in about 1 week (around 08/30/2019).   Loraine Grip Mayers, PA-C

## 2019-08-24 LAB — TSH: TSH: 3.02 u[IU]/mL (ref 0.450–4.500)

## 2019-09-27 DIAGNOSIS — Z79899 Other long term (current) drug therapy: Secondary | ICD-10-CM | POA: Diagnosis not present

## 2019-09-27 DIAGNOSIS — Z0001 Encounter for general adult medical examination with abnormal findings: Secondary | ICD-10-CM | POA: Diagnosis not present

## 2019-10-04 ENCOUNTER — Encounter: Payer: Self-pay | Admitting: Gastroenterology

## 2019-11-22 ENCOUNTER — Ambulatory Visit (AMBULATORY_SURGERY_CENTER): Payer: Self-pay

## 2019-11-22 ENCOUNTER — Other Ambulatory Visit: Payer: Self-pay

## 2019-11-22 VITALS — Ht 65.0 in | Wt 177.0 lb

## 2019-11-22 DIAGNOSIS — Z86010 Personal history of colonic polyps: Secondary | ICD-10-CM

## 2019-11-22 MED ORDER — NA SULFATE-K SULFATE-MG SULF 17.5-3.13-1.6 GM/177ML PO SOLN: 1.0000 | Freq: Once | ORAL | 0 refills | Status: AC

## 2019-11-22 NOTE — Progress Notes (Signed)
No egg or soy allergy known to patient  No issues with past sedation with any surgeries or procedures no intubation problems in the past  No FH of Malignant Hyperthermia No diet pills per patient No home 02 use per patient  No blood thinners per patient  Pt denies issues with constipation  No A fib or A flutter  EMMI video to pt or via [place] 19 guidelines implemented in PV today with Pt and RN   Pt has been vaccinated for covid.  Due to the COVID-19 pandemic we are asking patients to follow these guidelines. Please only bring one care partner. Please be aware that your care partner may wait in the car in the parking lot or if they feel like they will be too hot to wait in the car, they may wait in the lobby on the 4th floor. All care partners are required to wear a mask the entire time (we do not have any that we can provide them), they need to practice social distancing, and we will do a Covid check for all patient's and care partners when you arrive. Also we will check their temperature and your temperature. If the care partner waits in their car they need to stay in the parking lot the entire time and we will call them on their cell phone when the patient is ready for discharge so they can bring the car to the front of the building. Also all patient's will need to wear a mask into building.

## 2019-12-07 ENCOUNTER — Other Ambulatory Visit: Payer: Self-pay

## 2019-12-07 ENCOUNTER — Encounter: Payer: Self-pay | Admitting: Gastroenterology

## 2019-12-07 ENCOUNTER — Ambulatory Visit (AMBULATORY_SURGERY_CENTER): Payer: Medicare PPO | Admitting: Gastroenterology

## 2019-12-07 VITALS — BP 136/67 | HR 60 | Temp 98.2°F | Resp 20 | Ht 65.0 in | Wt 177.0 lb

## 2019-12-07 DIAGNOSIS — D122 Benign neoplasm of ascending colon: Secondary | ICD-10-CM

## 2019-12-07 DIAGNOSIS — Z86010 Personal history of colonic polyps: Secondary | ICD-10-CM

## 2019-12-07 MED ORDER — SODIUM CHLORIDE 0.9 % IV SOLN: 500.0000 mL | Freq: Once | INTRAVENOUS | Status: DC

## 2019-12-07 NOTE — Progress Notes (Signed)
Called to room to assist during endoscopic procedure.  Patient ID and intended procedure confirmed with present staff. Received instructions for my participation in the procedure from the performing physician.  

## 2019-12-07 NOTE — Progress Notes (Signed)
Pt's states no medical or surgical changes since previsit or office visit.  Sp vitals.

## 2019-12-07 NOTE — Op Note (Signed)
[place] Patient Name: Maria [name] Procedure Date: 12/07/2019 9:08 AM MRN: [id number] Endoscopist: Remo Lipps P. Havery Moros , MD Age: 69 Referring MD:  Date of Birth: 12/11/1950 Gender: Female Account #: [id number] Procedure:                Colonoscopy Indications:              High risk colon cancer surveillance: Personal                            history of colonic polyps (small polyp in 2017,                            multiple TAs in 2016 - Dr. Benson Norway) Medicines:                Monitored Anesthesia Care Procedure:                Pre-Anesthesia Assessment:                           - Prior to the procedure, a History and Physical                            was performed, and patient medications and                            allergies were reviewed. The patient's tolerance of                            previous anesthesia was also reviewed. The risks                            and benefits of the procedure and the sedation                            options and risks were discussed with the patient.                            All questions were answered, and informed consent                            was obtained. Prior Anticoagulants: The patient has                            taken no previous anticoagulant or antiplatelet                            agents. ASA Grade Assessment: II - A patient with                            mild systemic disease. After reviewing the risks                            and benefits, the patient was deemed in  satisfactory condition to undergo the procedure.                           After obtaining informed consent, the colonoscope                            was passed under direct vision. Throughout the                            procedure, the patient's blood pressure, pulse, and                            oxygen saturations were monitored continuously. The                            Colonoscope was introduced  through the anus and                            advanced to the the cecum, identified by                            appendiceal orifice and ileocecal valve. The                            colonoscopy was performed without difficulty. The                            patient tolerated the procedure well. The quality                            of the bowel preparation was good. The ileocecal                            valve, appendiceal orifice, and rectum were                            photographed. Scope In: 9:19:47 AM Scope Out: 9:37:03 AM Scope Withdrawal Time: 0 hours 10 minutes 33 seconds  Total Procedure Duration: 0 hours 17 minutes 16 seconds  Findings:                 The perianal and digital rectal examinations were                            normal.                           A 6 mm polyp was found in the ascending colon. The                            polyp was sessile. The polyp was removed with a                            cold snare. Resection and retrieval were complete.  Multiple medium-mouthed diverticula were found in                            the entire colon.                           Internal hemorrhoids were found during retroflexion.                           The exam was otherwise without abnormality. Complications:            No immediate complications. Estimated blood loss:                            Minimal. Estimated Blood Loss:     Estimated blood loss was minimal. Impression:               - One 6 mm polyp in the ascending colon, removed                            with a cold snare. Resected and retrieved.                           - Diverticulosis in the entire examined colon.                           - Internal hemorrhoids.                           - The examination was otherwise normal. Recommendation:           - Patient has a contact number available for                            emergencies. The signs and symptoms of  potential                            delayed complications were discussed with the                            patient. Return to normal activities tomorrow.                            Written discharge instructions were provided to the                            patient.                           - Resume previous diet.                           - Continue present medications.                           - Await pathology results. Remo Lipps P. Armond Cuthrell, MD 12/07/2019 9:42:25 AM This report has been signed electronically.

## 2019-12-07 NOTE — Progress Notes (Signed)
Patient ID: Maria [name], female   DOB: 10-Oct-1950, 69 y.o.   MRN: [id number] MD in to speak with pt. Per Dr Havery Moros, pt needs a follow up appt in clinic.

## 2019-12-07 NOTE — Progress Notes (Signed)
pt tolerated well. VSS. awake and to recovery. Report given to RN.  

## 2019-12-07 NOTE — Discharge Instructions (Signed)
Resume previous medications. Handouts on findings given to patient. (polyps, diverticulosis, hemorrhoids) Await pathology for final recommendations. Office will call regarding follow up appt in clinic  YOU HAD AN ENDOSCOPIC PROCEDURE TODAY AT [facility] [place]:   Refer to the procedure report that was given to you for any specific questions about what was found during the examination.  If the procedure report does not answer your questions, please call your gastroenterologist to clarify.  If you requested that your care partner not be given the details of your procedure findings, then the procedure report has been included in a sealed envelope for you to review at your convenience later.  YOU SHOULD EXPECT: Some feelings of bloating in the abdomen. Passage of more gas than usual.  Walking can help get rid of the air that was put into your GI tract during the procedure and reduce the bloating. If you had a lower endoscopy (such as a colonoscopy or flexible sigmoidoscopy) you may notice spotting of blood in your stool or on the toilet paper. If you underwent a bowel prep for your procedure, you may not have a normal bowel movement for a few days.  Please Note:  You might notice some irritation and congestion in your nose or some drainage.  This is from the oxygen used during your procedure.  There is no need for concern and it should clear up in a day or so.  SYMPTOMS TO REPORT IMMEDIATELY:   Following lower endoscopy (colonoscopy or flexible sigmoidoscopy):  Excessive amounts of blood in the stool  Significant tenderness or worsening of abdominal pains  Swelling of the abdomen that is new, acute  Fever of 100F or higher  For urgent or emergent issues, a gastroenterologist can be reached at any hour by calling [phone]. Do not use MyChart messaging for urgent concerns.    DIET:  We do recommend a small meal at first, but then you may proceed to your regular diet.  Drink plenty  of fluids but you should avoid alcoholic beverages for 24 hours.  ACTIVITY:  You should plan to take it easy for the rest of today and you should NOT DRIVE or use heavy machinery until tomorrow (because of the sedation medicines used during the test).    FOLLOW UP: Our staff will call the number listed on your records 48-72 hours following your procedure to check on you and address any questions or concerns that you may have regarding the information given to you following your procedure. If we do not reach you, we will leave a message.  We will attempt to reach you two times.  During this call, we will ask if you have developed any symptoms of COVID 19. If you develop any symptoms (ie: fever, flu-like symptoms, shortness of breath, cough etc.) before then, please call [phone].  If you test positive for Covid 19 in the 2 weeks post procedure, please call and report this information to [place].    If any biopsies were taken you will be contacted by phone or by letter within the next 1-3 weeks.  Please call us at [phone] if you have not heard about the biopsies in 3 weeks.    SIGNATURES/CONFIDENTIALITY: You and/or your care partner have signed paperwork which will be entered into your electronic medical record.  These signatures attest to the fact that that the information above on your After Visit Summary has been reviewed and is understood.  Full responsibility of the confidentiality of this  discharge information lies with you and/or your care-partner. 

## 2019-12-11 ENCOUNTER — Telehealth: Payer: Self-pay | Admitting: *Deleted

## 2019-12-11 NOTE — Telephone Encounter (Signed)
°  Follow up Call-  Call back number 12/07/2019  Post procedure Call Back phone  # [phone]  Permission to leave phone message Yes  Some recent data might be hidden     Patient questions:  Do you have a fever, pain , or abdominal swelling? No. Pain Score  0 *  Have you tolerated food without any problems? Yes.  Have you been able to return to your normal activities? Yes.    Do you have any questions about your discharge instructions: Diet   No. Medications  No. Follow up visit  No.  Do you have questions or concerns about your Care? No.  Actions: * If pain score is 4 or above: No action needed, pain <4.  1. Have you developed a fever since your procedure? no  2.   Have you had an respiratory symptoms (SOB or cough) since your procedure? no  3.   Have you tested positive for COVID 19 since your procedure no  4.   Have you had any family members/close contacts diagnosed with the COVID 19 since your procedure?  no   If yes to any of these questions please route to Joylene John, RN and Joella Prince, RN

## 2019-12-12 ENCOUNTER — Encounter: Payer: Self-pay | Admitting: Family Medicine

## 2019-12-13 ENCOUNTER — Telehealth: Payer: Self-pay | Admitting: Gastroenterology

## 2019-12-13 NOTE — Telephone Encounter (Signed)
Dr. Loletha Carrow as DOD on 12/13/19  Dr. Havery Moros patient, recently had colon on 12/07/2019, pt seen in 2018 for RLQ pain, CT scan 09/17/2016  She reports right lower abdominal discomfort that started earlier this week, no bleeding, no diarrhea, pt states that she is able to eat and drink, no heavy lifting, no fever, no palpable knot in the area, pt states that her lower abdomen feels better when she applies pressure. Pt describes as a constant dull pain. Pt states that she feels the pain more when putting on socks or tying shoes. Please advise, thank you

## 2019-12-13 NOTE — Telephone Encounter (Signed)
Spoke with patient regarding recommendations as outlined below. Pt aware that she should let us know if her symptoms seem to be worsening, if she develops a fever, or any bleeding. Pt verbalized understanding.

## 2019-12-13 NOTE — Telephone Encounter (Signed)
Difficult to say what that is from the description, not clear if it is digestive without associated GI symptoms.  Worse with certain movements and better when applying pressure suggest possibly muscular.  Single small polyp removed from right colon a week ago - so I do not think this is a complication of the procedure.  I recommend a heating pad and some ibuprofen.  Let us know if it seems to be escalating, fever develops, bleeding.  (Copied to SA for his return to office next week)  - HD

## 2019-12-15 NOTE — Telephone Encounter (Signed)
Sorry to hear this, agree this seems unlikely related to her procedure but if symptoms persist she should call us back

## 2020-02-05 ENCOUNTER — Encounter: Payer: Self-pay | Admitting: Gastroenterology

## 2020-02-05 ENCOUNTER — Ambulatory Visit (INDEPENDENT_AMBULATORY_CARE_PROVIDER_SITE_OTHER): Payer: Medicare PPO | Admitting: Gastroenterology

## 2020-02-05 VITALS — BP 124/78 | HR 67 | Ht 66.0 in | Wt 181.0 lb

## 2020-02-05 DIAGNOSIS — K449 Diaphragmatic hernia without obstruction or gangrene: Secondary | ICD-10-CM | POA: Diagnosis not present

## 2020-02-05 DIAGNOSIS — K219 Gastro-esophageal reflux disease without esophagitis: Secondary | ICD-10-CM

## 2020-02-05 DIAGNOSIS — K5900 Constipation, unspecified: Secondary | ICD-10-CM

## 2020-02-05 MED ORDER — POLYETHYLENE GLYCOL 3350 17 GM/SCOOP PO POWD: 17.0000 g | Freq: Every day | ORAL | 0 refills | Status: DC

## 2020-02-05 NOTE — Patient Instructions (Addendum)
If you are age 70 or older, your body mass index should be between 23-30. Your Body mass index is 29.21 kg/m. If this is out of the aforementioned range listed, please consider follow up with your Primary Care Provider.  If you are age 60 or younger, your body mass index should be between 19-25. Your Body mass index is 29.21 kg/m. If this is out of the aformentioned range listed, please consider follow up with your Primary Care Provider.   You have been scheduled for an endoscopy. Please follow written instructions given to you at your visit today. If you use inhalers (even only as needed), please bring them with you on the day of your procedure.  Please remember to have a clear liquid dinner the evening before your procedure.  Please purchase the following medications over the counter and take as directed: Miralax: Take as directed once a day, titrate as needed  Thank you for entrusting me with your care and for choosing [facility], Dr. Marion Cellar

## 2020-02-05 NOTE — Progress Notes (Signed)
HPI :  69 year old female here for follow-up visit for reflux and history of hemorrhoids.  Patient had a colonoscopy with me in September of this year.  She had 1 small adenoma removed.  Also noted to have diverticulosis and hemorrhoids.  At the time of this visit the reason for the visit was for hemorrhoids however she states these are no longer bothering her and she has no issues with this.  She does have some constipation will have a bowel movement once every 3 days or so, asking how she can manage this.  The main thing she wanted to discuss today was her history of reflux.  She has had multiple EGDs in the past which I reviewed with her.  2011, 2014, 2016 with Dr. Benson Norway, she has had LA grade D esophagitis in the setting of a moderate to large hiatal hernia in the past.  We placed her on a PPI and performed an EGD with her last in October 2018.  At that point time on PPI her esophagitis had healed and she had no evidence of Barrett's esophagus.  She had been taking omeprazole 40 mg once daily but has since stopped it altogether.  She states she does not like taking medications and is concerned about possible risks.  She does have a history of normal DEXA scan in 2018.  She does not have much reflux is bothering her at present time.  If she has trigger foods she will clearly have symptoms but if she is not eating that and avoiding her trigger she does not have any symptoms of bother her.  She denies any nausea or vomiting.  No abdominal pain.  She is sleeping well without any nocturnal symptoms.   Prior workup per Dr. Benson Norway: CT scan abdomen 09/17/16 - diverticulosis, otherwise DJD of lumbar spine Reportedly prior normal [place] and HIDA scan Colonoscopy 11/19/14 - 49mm polyp, pancolonic diverticulosis Colonoscopy 07/04/2012 - multiple small adenomas, diverticulosis, hemorrhoids EGD 11/19/09 - reflux esophagitis, otherwise normal EGD [id number] - LA grade D esophagitis, 5cm hiatal hernia EGD 11/19/2014 - LA  grade D esophagitis, 2cm hiatal hernia  Esophageal manometry 12/13/2014 - overall weak peristalsis, IRP normal, 20% large breaks  Colonoscopy 12/07/19 - The perianal and digital rectal examinations were normal. - A 6 mm polyp was found in the ascending colon. The polyp was sessile. The polyp was removed with a cold snare. Resection and retrieval were complete. - Multiple medium-mouthed diverticula were found in the entire colon. - Internal hemorrhoids were found during retroflexion. - The exam was otherwise without abnormality.  Surgical [P], colon, ascending, polyp - TUBULAR ADENOMA, NEGATIVE FOR HIGH GRADE DYSPLASIA (X MULTIPLE).   EGD 01/17/17 - - A 5 cm hiatal hernia was present. - The exam of the esophagus was otherwise normal. Interval healing of esophagitis on omeprazole. - A large amount of food was found in the gastric body and in the gastric antrum, prohibiting examination of the stomach and duodenum - the procedure was aborted prematurely due to this finding.    Past Medical History:  Diagnosis Date  . Arthritis   . Colon polyps   . Diverticulosis   . GERD (gastroesophageal reflux disease)   . Hemorrhoids, external without complications   . Hemorrhoids, internal   . Hernia, diaphragmatic, without obstruction   . HH (hiatus hernia) 2014  . Hypertension   . Reflux esophagitis   . Tubular adenoma of colon      Past Surgical History:  Procedure Laterality Date  .  COLONOSCOPY     [id number]  . ESOPHAGEAL MANOMETRY N/A 12/13/2014   Procedure: ESOPHAGEAL MANOMETRY (EM);  Surgeon: Carol Ada, MD;  Location: [facility] END[facility]COPY;  Service: Endoscopy;  Laterality: N/A;  . POLYPECTOMY    . SPINE SURGERY     Neck  . UPPER GASTROINTESTINAL ENDOSCOPY     Family History  Problem Relation Age of Onset  . Hypertension Father   . Arthritis Brother   . Colon cancer Neg Hx   . Rectal cancer Neg Hx   . Esophageal cancer Neg Hx   . Stomach cancer Neg Hx   . Pancreatic cancer  Neg Hx   . Prostate cancer Neg Hx   . Colon polyps Neg Hx    Social History   Tobacco Use  . Smoking status: Never Smoker  . Smokeless tobacco: Never Used  Vaping Use  . Vaping Use: Never used  Substance Use Topics  . Alcohol use: No    Alcohol/week: 0.0 standard drinks  . Drug use: No   Current Outpatient Medications  Medication Sig Dispense Refill  . acetaminophen (TYLENOL) 325 MG tablet Take 650 mg by mouth every 6 (six) hours as needed.    Marland Kitchen b complex vitamins tablet Take 1 tablet by mouth daily.    . celecoxib (CELEBREX) 100 MG capsule Take 1 capsule (100 mg total) by mouth 2 (two) times daily. Use as needed for arthritis pain 60 capsule 6  . hydrochlorothiazide (HYDRODIURIL) 12.5 MG tablet Take 1 tablet (12.5 mg total) by mouth daily. 90 tablet 3  . Multiple Vitamin (MULTIVITAMIN) tablet Take 1 tablet by mouth daily.    Marland Kitchen omeprazole (PRILOSEC) 40 MG capsule Take 1 capsule (40 mg total) by mouth daily. 90 capsule 3   No current facility-administered medications for this visit.   No Known Allergies   Review of Systems: All systems reviewed and negative except where noted in HPI.   Lab Results  Component Value Date   WBC 8.5 04/16/2019   HGB 13.3 04/16/2019   HCT 40.6 04/16/2019   MCV 91.2 04/16/2019   PLT 275.0 04/16/2019    Lab Results  Component Value Date   CREATININE 0.97 04/16/2019   BUN 17 04/16/2019   NA 137 04/16/2019   K 4.3 04/16/2019   CL 102 04/16/2019   CO2 28 04/16/2019    Lab Results  Component Value Date   ALT 17 04/16/2019   AST 19 04/16/2019   ALKPHOS 92 04/16/2019   BILITOT 0.3 04/16/2019     Physical Exam: BP 124/78   Pulse 67   Ht 5\' 6"  (1.676 m)   Wt 181 lb (82.1 kg)   BMI 29.21 kg/m  Constitutional: Pleasant,well-developed, female in no acute distress. Neurological: Alert and oriented to person place and time. Psychiatric: Normal mood and affect. Behavior is normal.   ASSESSMENT AND PLAN: 69 year old female here for  reassessment of the following:  GERD / Hiatal hernia - longstanding symptoms of reflux.  She has had numerous EGDs in the past showing severe esophagitis.  I performed a follow-up EGD on PPI in 2018 which showed no evidence of Barrett's esophagus and healing of the esophagitis.  She has since tried to manage her reflux with dietary change only, has essentially stopped PPI.  We discussed long-term management.  I am concerned about the size of the hiatal hernia and risks for Barrett's esophagus moving forward off of therapy.  I discussed long-term risk benefits of chronic PPI use with her.  She does  not wish to take PPIs unless she has active esophagitis and given she feels well she questions whether or not she really needs this.  We discussed options of how to proceed.  She is requesting EGD to further evaluate her esophagus, assess for active esophagitis which would help influence her long-term management, and provide her piece of mind.  If she has severe esophagitis she would be willing to take PPI even though she feels well.  I think this is reasonable given she is otherwise feeling well off meds right now.  I discussed risk benefits of EGD and anesthesia with her and she wants to proceed.  Further recommendations pending the results.  Of note, given she had some retained food in her stomach at the last exam, recommend she stay on a liquid diet the evening prior to her EGD.  Constipation - discussed options, recent colonoscopy looked okay, will try some MiraLAX daily and titrate up or down as needed pending result.  She agreed  [place] Cellar, MD [facility]

## 2020-02-06 ENCOUNTER — Telehealth: Payer: Self-pay | Admitting: Gastroenterology

## 2020-02-06 NOTE — Telephone Encounter (Signed)
Lm on vm for patient to return call 

## 2020-02-06 NOTE — Telephone Encounter (Signed)
Spoke with patient in regards to Dr. Doyne Keel recommendations. Advised patient that Dr. Havery Moros will await procedure and can test for H. Pylori if any findings on the EGD suggest H. Pylori. Patient verbalized understanding and had no other concerns at the end of the call

## 2020-02-06 NOTE — Telephone Encounter (Signed)
Dr. Havery Moros, would you like patient to come in for stool study or await EGD on 02/22/20? Please advise on your recommendations.

## 2020-02-06 NOTE — Telephone Encounter (Signed)
H pylori is not a common cause for stool odor like that but would await her EGD and we can test for it if she has any findings on EGD that would suggest H pylori. Thanks

## 2020-02-06 NOTE — Telephone Encounter (Signed)
Pt states that she forgot to give Dr. Havery [name] some information yesterday. She stated that her bms are very pungent, she heard that this could be due to [facility] but is not sure so just wanted to make him aware of this.

## 2020-02-22 ENCOUNTER — Ambulatory Visit (AMBULATORY_SURGERY_CENTER): Payer: Medicare PPO | Admitting: Gastroenterology

## 2020-02-22 ENCOUNTER — Encounter: Payer: Self-pay | Admitting: Gastroenterology

## 2020-02-22 ENCOUNTER — Other Ambulatory Visit: Payer: Self-pay

## 2020-02-22 VITALS — BP 130/73 | HR 58 | Temp 97.3°F | Resp 11 | Ht 66.0 in | Wt 181.0 lb

## 2020-02-22 DIAGNOSIS — K219 Gastro-esophageal reflux disease without esophagitis: Secondary | ICD-10-CM

## 2020-02-22 DIAGNOSIS — K449 Diaphragmatic hernia without obstruction or gangrene: Secondary | ICD-10-CM | POA: Diagnosis not present

## 2020-02-22 MED ORDER — SODIUM CHLORIDE 0.9 % IV SOLN: 500.0000 mL | INTRAVENOUS | Status: DC

## 2020-02-22 MED ORDER — OMEPRAZOLE 20 MG PO CPDR: 20.0000 mg | DELAYED_RELEASE_CAPSULE | Freq: Every day | ORAL | 3 refills | Status: DC

## 2020-02-22 NOTE — Op Note (Signed)
[facility] Patient Name: Maria [name] Procedure Date: 02/22/2020 11:44 AM MRN: [id number] Endoscopist: Remo Lipps P. Havery Moros , MD Age: 69 Referring MD:  Date of Birth: 1950/08/19 Gender: Female Account #: [id number] Procedure:                Upper GI endoscopy Indications:              Follow-up of reflux esophagitis - history of LA                            grade D esophagitis in the setting of large hiatal                            hernia, previous EGD showed healing on PPI without                            Barrett's. Has stopped PPI. Patient has mostly been                            managing symptoms with dietary restriction, hoping                            to avoid chronic PPI use, here to screen for                            ongoing esophagitis in the absence of symptoms to                            help determine long term management Medicines:                Monitored Anesthesia Care Procedure:                Pre-Anesthesia Assessment:                           - Prior to the procedure, a History and Physical                            was performed, and patient medications and                            allergies were reviewed. The patient's tolerance of                            previous anesthesia was also reviewed. The risks                            and benefits of the procedure and the sedation                            options and risks were discussed with the patient.                            All questions were answered, and informed consent  was obtained. Prior Anticoagulants: The patient has                            taken no previous anticoagulant or antiplatelet                            agents. ASA Grade Assessment: II - A patient with                            mild systemic disease. After reviewing the risks                            and benefits, the patient was deemed in                            satisfactory  condition to undergo the procedure.                           After obtaining informed consent, the endoscope was                            passed under direct vision. Throughout the                            procedure, the patient's blood pressure, pulse, and                            oxygen saturations were monitored continuously. The                            Endoscope was introduced through the mouth, and                            advanced to the second part of duodenum. The upper                            GI endoscopy was accomplished without difficulty.                            The patient tolerated the procedure well. Scope In: Scope Out: Findings:                 Esophagogastric landmarks were identified: the                            Z-line was found at 32 cm, the gastroesophageal                            junction was found at 32 cm and the upper extent of                            the gastric folds was found at 36 cm from the  incisors.                           A 4 cm hiatal hernia was present.                           LA Grade B esophagitis was found at the GEJ. There                            was no obvious evidence of Barrett's esophagus                            otherwise.                           The exam of the esophagus was otherwise normal.                           The entire examined stomach was normal.                           The duodenal bulb and second portion of the                            duodenum were normal. Complications:            No immediate complications. Estimated blood loss:                            None. Estimated Blood Loss:     Estimated blood loss: none. Impression:               - Esophagogastric landmarks identified.                           - 4 cm hiatal hernia.                           - LA Grade B reflux esophagitis.                           - Normal esophagus otherwise                            - Normal stomach.                           - Normal duodenal bulb and second portion of the                            duodenum.                           Patient mostly asymptomatic but does have ongoing                            esophagitis, with history of severe esophagitis in  the past. I think she would benefit from chronic                            GERD therapy to prevent longterm complications from                            reflux in the setting of moderate sized hiatal                            hernia, will discuss options with her. I think                            lower dose PPI may be reasonable. Recommendation:           - Patient has a contact number available for                            emergencies. The signs and symptoms of potential                            delayed complications were discussed with the                            patient. Return to normal activities tomorrow.                            Written discharge instructions were provided to the                            patient.                           - Resume previous diet.                           - Continue present medications.                           - Recommend resuming omeprazole but can do so at                            20mg  / day if the patient is willing, will discuss                            options with her. Remo Lipps P. Jesaiah Fabiano, MD 02/22/2020 12:10:22 PM This report has been signed electronically.

## 2020-02-22 NOTE — Progress Notes (Signed)
PT taken to PACU. Monitors in place. VSS. Report given to RN.

## 2020-02-22 NOTE — Patient Instructions (Signed)
Handout given:  Hiatal hernia Resume previous diet Continue current medications Start omeprazole 20 mg daily, take 30 min before eating Await pathology results  YOU HAD AN ENDOSCOPIC PROCEDURE TODAY AT [facility]:   Refer to the procedure report that was given to you for any specific questions about what was found during the examination.  If the procedure report does not answer your questions, please call your gastroenterologist to clarify.  If you requested that your care partner not be given the details of your procedure findings, then the procedure report has been included in a sealed envelope for you to review at your convenience later.  YOU SHOULD EXPECT: Some feelings of bloating in the abdomen. Passage of more gas than usual.  Walking can help get rid of the air that was put into your GI tract during the procedure and reduce the bloating. If you had a lower endoscopy (such as a colonoscopy or flexible sigmoidoscopy) you may notice spotting of blood in your stool or on the toilet paper. If you underwent a bowel prep for your procedure, you may not have a normal bowel movement for a few days.  Please Note:  You might notice some irritation and congestion in your nose or some drainage.  This is from the oxygen used during your procedure.  There is no need for concern and it should clear up in a day or so.  SYMPTOMS TO REPORT IMMEDIATELY:   Following upper endoscopy (EGD)  Vomiting of blood or coffee ground material  New chest pain or pain under the shoulder blades  Painful or persistently difficult swallowing  New shortness of breath  Fever of 100F or higher  Black, tarry-looking stools  For urgent or emergent issues, a gastroenterologist can be reached at any hour by calling [phone]. Do not use MyChart messaging for urgent concerns.   DIET:  We do recommend a small meal at first, but then you may proceed to your regular diet.  Drink plenty of fluids but you  should avoid alcoholic beverages for 24 hours.  ACTIVITY:  You should plan to take it easy for the rest of today and you should NOT DRIVE or use heavy machinery until tomorrow (because of the sedation medicines used during the test).    FOLLOW UP: Our staff will call the number listed on your records 48-72 hours following your procedure to check on you and address any questions or concerns that you may have regarding the information given to you following your procedure. If we do not reach you, we will leave a message.  We will attempt to reach you two times.  During this call, we will ask if you have developed any symptoms of COVID 19. If you develop any symptoms (ie: fever, flu-like symptoms, shortness of breath, cough etc.) before then, please call [phone].  If you test positive for Covid 19 in the 2 weeks post procedure, please call and report this information to [place].    If any biopsies were taken you will be contacted by phone or by letter within the next 1-3 weeks.  Please call us at [phone] if you have not heard about the biopsies in 3 weeks.   SIGNATURES/CONFIDENTIALITY: You and/or your care partner have signed paperwork which will be entered into your electronic medical record.  These signatures attest to the fact that that the information above on your After Visit Summary has been reviewed and is understood.  Full responsibility of the confidentiality of  this discharge information lies with you and/or your care-partner.

## 2020-02-22 NOTE — Progress Notes (Signed)
Reviewed medical/surgical history with patient and noted any changes

## 2020-02-26 ENCOUNTER — Telehealth: Payer: Self-pay

## 2020-02-26 NOTE — Telephone Encounter (Signed)
  Follow up Call-  Call back number 02/22/2020 12/07/2019  Post procedure Call Back phone  # [phone]  Permission to leave phone message Yes Yes  Some recent data might be hidden     Patient questions:  Do you have a fever, pain , or abdominal swelling? No. Pain Score  0 *  Have you tolerated food without any problems? Yes.    Have you been able to return to your normal activities? Yes.    Do you have any questions about your discharge instructions: Diet   No. Medications  No. Follow up visit  No.  Do you have questions or concerns about your Care? No.  Actions: * If pain score is 4 or above: No action needed, pain <4. 1. Have you developed a fever since your procedure? no  2.   Have you had an respiratory symptoms (SOB or cough) since your procedure? no  3.   Have you tested positive for COVID 19 since your procedure no  4.   Have you had any family members/close contacts diagnosed with the COVID 19 since your procedure?  no   If yes to any of these questions please route to Joylene John, RN and Joella Prince, RN

## 2020-04-14 ENCOUNTER — Ambulatory Visit: Payer: Self-pay | Admitting: *Deleted

## 2020-06-29 NOTE — Progress Notes (Addendum)
[facility] [place], [place], [place] [address] [phone]  Date:  07/02/2020   Name:  Maria [name]   DOB:  November 25, 1950   MRN:  [id number]  PCP:  [name], MD    Chief Complaint: Shoulder Pain (Left shoulder pain, arthritis, tender to touch radiates down arm, hx of frozen shoulder) and Flank Pain (Feels "swollen" after drinking acidic drinks, frequency, no dysuria, no hematuria)   History of Present Illness:  Maria [name] is a 70 y.o. very pleasant female patient who presents with the following:  Here today with concern of shoulder pain.  History of prediabetes, GERD Last seen by myself 1 year ago with shingles outbreak  COVID-19 booster Second of [employer] 1 year ago, can update today  HCTZ 12.5 Celebrex as needed Prilosec 20  Seen by her GI doctor - [facility]-November.  She underwent EGD in December, hiatal hernia and esophagitis noted.  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  She notes that she was treated with therapy and exercise The shoulder is tender to the touch and painful at night She has pretty good ROM and is doing her exercises for same to maintain her range  Also, she is concerned about her kidneys.  She urinates more frequently and may get a sensation of "swelling" in her right flank after she drinks a juice or soda No hematuria No urinary frequency  Patient Active Problem List   Diagnosis Date Noted  . GERD (gastroesophageal reflux disease) 12/07/2016  . Prediabetes 10/13/2015  . Reflux esophagitis 11/20/2014  . NONSPECIFIC ABN FINDING RAD & OTH EXAM GI TRACT 09/19/2009    Past Medical History:  Diagnosis Date  . Arthritis   . Colon polyps   . Diverticulosis   . GERD (gastroesophageal reflux disease)   .  Hemorrhoids, external without complications   . Hemorrhoids, internal   . HH (hiatus hernia) 2014  . Hypertension   . Osteoporosis    pre  . Reflux esophagitis   . Tubular adenoma of colon     Past Surgical History:  Procedure Laterality Date  . COLONOSCOPY     [id number]  . ESOPHAGEAL MANOMETRY N/A 12/13/2014   Procedure: ESOPHAGEAL MANOMETRY (EM);  Surgeon: Carol Ada, MD;  Location: [facility];  Service: Endoscopy;  Laterality: N/A;  . POLYPECTOMY    . SPINE SURGERY     Neck  . UPPER GASTROINTESTINAL ENDOSCOPY      Social History   Tobacco Use  . Smoking status: Never Smoker  . Smokeless tobacco: Never Used  Vaping Use  . Vaping Use: Never used  Substance Use Topics  . Alcohol use: No    Alcohol/week: 0.0 standard drinks  . Drug use: No    Family History  Problem Relation Age of Onset  . Hypertension Father   . Arthritis Brother   . Colon cancer Neg Hx   . Rectal cancer Neg Hx   . Esophageal cancer Neg Hx   . Stomach cancer Neg Hx   . Pancreatic cancer Neg Hx   . Prostate cancer Neg Hx   . Colon polyps Neg Hx     No Known Allergies  Medication list has been reviewed and updated.  Current Outpatient Medications on  File Prior to Visit  Medication Sig Dispense Refill  . acetaminophen (TYLENOL) 325 MG tablet Take 650 mg by mouth every 6 (six) hours as needed.    Marland Kitchen b complex vitamins tablet Take 1 tablet by mouth daily.    . celecoxib (CELEBREX) 100 MG capsule Take 1 capsule (100 mg total) by mouth 2 (two) times daily. Use as needed for arthritis pain 60 capsule 6  . hydrochlorothiazide (HYDRODIURIL) 12.5 MG tablet Take 1 tablet (12.5 mg total) by mouth daily. 90 tablet 3  . Multiple Vitamin (MULTIVITAMIN) tablet Take 1 tablet by mouth daily.    Marland Kitchen omeprazole (PRILOSEC) 20 MG capsule Take 1 capsule (20 mg total) by mouth daily. Please take with water only and take at least 30 minutes before eating. 90 capsule 3   No current facility-administered medications  on file prior to visit.    Review of Systems:  As per HPI- otherwise negative.   Physical Examination: Vitals:   07/02/20 1042  BP: 122/80  Pulse: 66  Resp: 16  Temp: (!) 97.3 F (36.3 C)  SpO2: 99%   Vitals:   07/02/20 1042  Weight: 170 lb (77.1 kg)  Height: 5\' 6"  (1.676 m)   Body mass index is 27.44 kg/m. Ideal Body Weight: Weight in (lb) to have BMI = 25: 154.6  GEN: no acute distress. Overweight, looks well  HEENT: Atraumatic, Normocephalic.  Ears and Nose: No external deformity. CV: RRR, No M/G/R. No JVD. No thrill. No extra heart sounds. PULM: CTA B, no wheezes, crackles, rhonchi. No retractions. No resp. distress. No accessory muscle use. ABD: S, NT, ND, +BS. No rebound. No HSM. EXTR: No c/c/e PSYCH: Normally interactive. Conversant.  She has tenderness over the left rotator cuff insertion, discomfort with internal and external rotation.  Strength of the rotator cuff is normal, range of motion is as expected for age.  Normal strength of both upper extremities Abdominal and flank exams are benign, I cannot reproduce tenderness No skin findings  Results for orders placed or performed in visit on 07/02/20  POCT urinalysis dipstick  Result Value Ref Range   Color, UA yellow yellow   Clarity, UA cloudy (A) clear   Glucose, UA negative negative mg/dL   Bilirubin, UA small (A) negative   Ketones, POC UA small (15) (A) negative mg/dL   Spec Grav, UA >=1.030 (A) 1.010 - 1.025   Blood, UA moderate (A) negative   pH, UA 5.0 5.0 - 8.0   Protein Ur, POC =30 (A) negative mg/dL   Urobilinogen, UA 1.0 0.2 or 1.0 E.U./dL   Nitrite, UA Negative Negative   Leukocytes, UA Negative Negative    Assessment and Plan: Screening for deficiency anemia - Plan: CBC  Screening for hyperlipidemia - Plan: Lipid panel  Pre-diabetes - Plan: Comprehensive metabolic panel, Hemoglobin A1c  Gastroesophageal reflux disease with esophagitis, unspecified whether hemorrhage  Fatigue,  unspecified type - Plan: TSH, VITAMIN D 25 Hydroxy (Vit-D Deficiency, Fractures)  Flank pain - Plan: POCT urinalysis dipstick, US Abdomen Limited RUQ (LIVER/GB)  Hematuria, unspecified type - Plan: Urine Culture, Urine Microscopic Only  Chronic left shoulder pain - Plan: Ambulatory referral to Orthopedic Surgery  Maria [name] is seen today with a couple of concerns.  We will follow up on her routine labs today She notes a history of frozen shoulder, now with recurrent shoulder pain.  I suspect at this time she may have more problems with rotator cuff tendinitis and arthritis.  Refer to orthopedics for evaluation, suspect  a steroid injection may be helpful She also has noted some flank pain, and also possibly some right upper quadrant pain after eating.  Her urine dip today shows microhematuria.  We will check a urine micro, urine culture.  Other labs are pending as above.  Ordered right upper quadrant ultrasound to evaluate her gallbladder.  If all of this is unrevealing, plan for a noncontrast renal CT to evaluate for stones  She has been asked to let me know if any changes or gets worse in the meantime, otherwise I will be in touch with her lab and [place] results  This visit occurred during the SARS-CoV-2 public health emergency.  Safety protocols were in place, including screening questions prior to the visit, additional usage of staff PPE, and extensive cleaning of exam room while observing appropriate contact time as indicated for disinfecting solutions.     Signed Lamar Blinks, MD  Received her labs as below, message to pt  Results for orders placed or performed in visit on 07/02/20  Urine Culture   Specimen: Blood  Result Value Ref Range   MICRO NUMBER: [id number]    SPECIMEN QUALITY: Adequate    Sample Source NOT GIVEN    STATUS: FINAL    ISOLATE 1:      Mixed genital flora isolated. These superficial bacteria are not indicative of a urinary tract infection. No further organism  identification is warranted on this specimen. If clinically indicated, recollect clean-catch, mid-stream urine and transfer  immediately to Urine Culture Transport Tube.   CBC  Result Value Ref Range   WBC 5.7 4.0 - 10.5 K/uL   RBC 4.79 3.87 - 5.11 Mil/uL   Platelets 264.0 150.0 - 400.0 K/uL   Hemoglobin 14.4 12.0 - 15.0 g/dL   HCT 43.2 36.0 - 46.0 %   MCV 90.2 78.0 - 100.0 fl   MCHC 33.3 30.0 - 36.0 g/dL   RDW 12.4 11.5 - 15.5 %  Comprehensive metabolic panel  Result Value Ref Range   Sodium 137 135 - 145 mEq/L   Potassium 4.2 3.5 - 5.1 mEq/L   Chloride 100 96 - 112 mEq/L   CO2 27 19 - 32 mEq/L   Glucose, Bld 93 70 - 99 mg/dL   BUN 13 6 - 23 mg/dL   Creatinine, Ser 1.12 0.40 - 1.20 mg/dL   Total Bilirubin 0.7 0.2 - 1.2 mg/dL   Alkaline Phosphatase 96 39 - 117 U/L   AST 16 0 - 37 U/L   ALT 15 0 - 35 U/L   Total Protein 7.4 6.0 - 8.3 g/dL   Albumin 4.2 3.5 - 5.2 g/dL   GFR 49.80 (L) >60.00 mL/min   Calcium 10.4 8.4 - 10.5 mg/dL  Hemoglobin A1c  Result Value Ref Range   Hgb A1c MFr Bld 6.1 4.6 - 6.5 %  Lipid panel  Result Value Ref Range   Cholesterol 179 0 - 200 mg/dL   Triglycerides 119.0 0.0 - 149.0 mg/dL   HDL 38.30 (L) >39.00 mg/dL   VLDL 23.8 0.0 - 40.0 mg/dL   LDL Cholesterol 117 (H) 0 - 99 mg/dL   Total CHOL/HDL Ratio 5    NonHDL 141.02   TSH  Result Value Ref Range   TSH 2.42 0.35 - 4.50 uIU/mL  VITAMIN D 25 Hydroxy (Vit-D Deficiency, Fractures)  Result Value Ref Range   VITD 14.98 (L) 30.00 - 100.00 ng/mL  Urine Microscopic Only  Result Value Ref Range   WBC, UA 3-6/hpf (A) 0-2/hpf  RBC / HPF 3-6/hpf (A) 0-2/hpf   Squamous Epithelial / LPF Rare(0-4/hpf) Rare(0-4/hpf)   Bacteria, UA Rare(<10/hpf) (A) None   Amorphous Present (A) None;Present  POCT urinalysis dipstick  Result Value Ref Range   Color, UA yellow yellow   Clarity, UA cloudy (A) clear   Glucose, UA negative negative mg/dL   Bilirubin, UA small (A) negative   Ketones, POC UA small (15)  (A) negative mg/dL   Spec Grav, UA >=1.030 (A) 1.010 - 1.025   Blood, UA moderate (A) negative   pH, UA 5.0 5.0 - 8.0   Protein Ur, POC =30 (A) negative mg/dL   Urobilinogen, UA 1.0 0.2 or 1.0 E.U./dL   Nitrite, UA Negative Negative   Leukocytes, UA Negative Negative     IMPRESSION: 1.  Normal sonographic appearance of the gallbladder. 2. Diffusely increased liver parenchymal echogenicity which is nonspecific but most commonly seen with hepatic steatosis. Probable areas of focal fatty sparing adjacent to the gallbladder fossa.  addnd 4/18-patient contacted with question about her labs and ultrasound, gave her a call-she has not heard from Ortho, I gave her the phone number so she can call them She is interested in doing those a noncontrast kidney CT and also urology referral which I think make sense.  I will set this up for her She has gotten vitamin D rx We discussed mild decrease in GFR, we will continue to monitor

## 2020-06-29 NOTE — Patient Instructions (Addendum)
It was very nice to see you again today, I will be in touch your labs soon as possible.  I recommend you get your second shingles vaccine, and COVID booster if not done already I am going to have you see orthopedics (Dr Veverly Fells at [facility] in [place] if possible)- I suspect you have rotator cuff tendonitis and/ or arthritis in your shoulder We will set you up also for a gallbladder ultrasound, and I will check your urine for any sign of infection If your abdominal pain is worsening at all please alert me!  Assuming all is well, please see me in 6 months for routine follow-up

## 2020-07-02 ENCOUNTER — Other Ambulatory Visit: Payer: Self-pay

## 2020-07-02 ENCOUNTER — Ambulatory Visit: Payer: Medicare PPO | Admitting: Family Medicine

## 2020-07-02 VITALS — BP 122/80 | HR 66 | Temp 97.3°F | Resp 16 | Ht 66.0 in | Wt 170.0 lb

## 2020-07-02 DIAGNOSIS — R319 Hematuria, unspecified: Secondary | ICD-10-CM

## 2020-07-02 DIAGNOSIS — Z13220 Encounter for screening for lipoid disorders: Secondary | ICD-10-CM

## 2020-07-02 DIAGNOSIS — R7303 Prediabetes: Secondary | ICD-10-CM

## 2020-07-02 DIAGNOSIS — Z130 Encounter for screening for diseases of the blood and blood-forming organs and certain disorders involving the immune mechanism: Secondary | ICD-10-CM

## 2020-07-02 DIAGNOSIS — M25512 Pain in left shoulder: Secondary | ICD-10-CM

## 2020-07-02 DIAGNOSIS — E559 Vitamin D deficiency, unspecified: Secondary | ICD-10-CM

## 2020-07-02 DIAGNOSIS — R5383 Other fatigue: Secondary | ICD-10-CM | POA: Diagnosis not present

## 2020-07-02 DIAGNOSIS — K2100 Gastro-esophageal reflux disease with esophagitis, without bleeding: Secondary | ICD-10-CM

## 2020-07-02 DIAGNOSIS — R109 Unspecified abdominal pain: Secondary | ICD-10-CM

## 2020-07-02 DIAGNOSIS — G8929 Other chronic pain: Secondary | ICD-10-CM

## 2020-07-02 LAB — COMPREHENSIVE METABOLIC PANEL
ALT: 15 U/L (ref 0–35)
AST: 16 U/L (ref 0–37)
Albumin: 4.2 g/dL (ref 3.5–5.2)
Alkaline Phosphatase: 96 U/L (ref 39–117)
BUN: 13 mg/dL (ref 6–23)
CO2: 27 mEq/L (ref 19–32)
Calcium: 10.4 mg/dL (ref 8.4–10.5)
Chloride: 100 mEq/L (ref 96–112)
Creatinine, Ser: 1.12 mg/dL (ref 0.40–1.20)
GFR: 49.8 mL/min — ABNORMAL LOW (ref >60.00)
Glucose, Bld: 93 mg/dL (ref 70–99)
Potassium: 4.2 mEq/L (ref 3.5–5.1)
Sodium: 137 mEq/L (ref 135–145)
Total Bilirubin: 0.7 mg/dL (ref 0.2–1.2)
Total Protein: 7.4 g/dL (ref 6.0–8.3)

## 2020-07-02 LAB — URINALYSIS, MICROSCOPIC ONLY

## 2020-07-02 LAB — CBC
HCT: 43.2 % (ref 36.0–46.0)
Hemoglobin: 14.4 g/dL (ref 12.0–15.0)
MCHC: 33.3 g/dL (ref 30.0–36.0)
MCV: 90.2 fl (ref 78.0–100.0)
Platelets: 264 10*3/uL (ref 150.0–400.0)
RBC: 4.79 Mil/uL (ref 3.87–5.11)
RDW: 12.4 % (ref 11.5–15.5)
WBC: 5.7 10*3/uL (ref 4.0–10.5)

## 2020-07-02 LAB — POCT URINALYSIS DIP (MANUAL ENTRY)
Glucose, UA: NEGATIVE mg/dL
Leukocytes, UA: NEGATIVE
Nitrite, UA: NEGATIVE
Protein Ur, POC: 30 mg/dL — AB
Spec Grav, UA: >=1.03 — AB (ref 1.010–1.025)
Urobilinogen, UA: 1 E.U./dL
pH, UA: 5 (ref 5.0–8.0)

## 2020-07-02 LAB — VITAMIN D 25 HYDROXY (VIT D DEFICIENCY, FRACTURES): VITD: 14.98 ng/mL — ABNORMAL LOW (ref 30.00–100.00)

## 2020-07-02 LAB — HEMOGLOBIN A1C: Hgb A1c MFr Bld: 6.1 % (ref 4.6–6.5)

## 2020-07-02 LAB — LIPID PANEL
Cholesterol: 179 mg/dL (ref 0–200)
HDL: 38.3 mg/dL — ABNORMAL LOW (ref >39.00)
LDL Cholesterol: 117 mg/dL — ABNORMAL HIGH (ref 0–99)
NonHDL: 141.02
Total CHOL/HDL Ratio: 5
Triglycerides: 119 mg/dL (ref 0.0–149.0)
VLDL: 23.8 mg/dL (ref 0.0–40.0)

## 2020-07-02 LAB — TSH: TSH: 2.42 u[IU]/mL (ref 0.35–4.50)

## 2020-07-03 LAB — URINE CULTURE
MICRO NUMBER:: 11765609
SPECIMEN QUALITY:: ADEQUATE

## 2020-07-04 ENCOUNTER — Ambulatory Visit (HOSPITAL_BASED_OUTPATIENT_CLINIC_OR_DEPARTMENT_OTHER)
Admission: RE | Admit: 2020-07-04 | Discharge: 2020-07-04 | Disposition: A | Discharge status: Home / Self Care | Payer: Medicare PPO | Source: Ambulatory Visit | Attending: Family Medicine | Admitting: Family Medicine

## 2020-07-04 ENCOUNTER — Other Ambulatory Visit: Payer: Self-pay

## 2020-07-04 ENCOUNTER — Encounter: Payer: Self-pay | Admitting: Family Medicine

## 2020-07-04 DIAGNOSIS — K760 Fatty (change of) liver, not elsewhere classified: Secondary | ICD-10-CM | POA: Diagnosis not present

## 2020-07-04 DIAGNOSIS — R109 Unspecified abdominal pain: Secondary | ICD-10-CM | POA: Diagnosis not present

## 2020-07-04 MED ORDER — VITAMIN D3 1.25 MG (50000 UT) PO CAPS: ORAL_CAPSULE | ORAL | 0 refills | Status: DC

## 2020-07-04 NOTE — Addendum Note (Signed)
Addended by: Lamar Blinks C on: 07/04/2020 06:01 PM   Modules accepted: Orders

## 2020-07-07 ENCOUNTER — Encounter: Payer: Self-pay | Admitting: Family Medicine

## 2020-07-07 NOTE — Addendum Note (Signed)
Addended by: Lamar Blinks C on: 07/07/2020 04:27 PM   Modules accepted: Orders

## 2020-07-10 ENCOUNTER — Other Ambulatory Visit: Payer: Self-pay

## 2020-07-10 ENCOUNTER — Ambulatory Visit (HOSPITAL_BASED_OUTPATIENT_CLINIC_OR_DEPARTMENT_OTHER)
Admission: RE | Admit: 2020-07-10 | Discharge: 2020-07-10 | Disposition: A | Discharge status: Home / Self Care | Payer: Medicare PPO | Source: Ambulatory Visit | Attending: Family Medicine | Admitting: Family Medicine

## 2020-07-10 DIAGNOSIS — K760 Fatty (change of) liver, not elsewhere classified: Secondary | ICD-10-CM | POA: Diagnosis not present

## 2020-07-10 DIAGNOSIS — R3129 Other microscopic hematuria: Secondary | ICD-10-CM | POA: Diagnosis not present

## 2020-07-10 DIAGNOSIS — R319 Hematuria, unspecified: Secondary | ICD-10-CM | POA: Diagnosis not present

## 2020-07-10 DIAGNOSIS — M5137 Other intervertebral disc degeneration, lumbosacral region: Secondary | ICD-10-CM | POA: Diagnosis not present

## 2020-07-10 DIAGNOSIS — K5750 Diverticulosis of both small and large intestine without perforation or abscess without bleeding: Secondary | ICD-10-CM | POA: Diagnosis not present

## 2020-07-11 ENCOUNTER — Encounter: Payer: Self-pay | Admitting: Family Medicine

## 2020-07-23 DIAGNOSIS — M7542 Impingement syndrome of left shoulder: Secondary | ICD-10-CM | POA: Diagnosis not present

## 2020-07-23 DIAGNOSIS — M25511 Pain in right shoulder: Secondary | ICD-10-CM | POA: Diagnosis not present

## 2020-08-04 ENCOUNTER — Encounter: Payer: Self-pay | Admitting: Family Medicine

## 2020-08-10 ENCOUNTER — Encounter: Payer: Self-pay | Admitting: Family Medicine

## 2020-08-12 NOTE — Progress Notes (Signed)
[place] at [facility] [place], [place], [place] [address] [phone]  Date:  08/13/2020   Name:  Maria [name]   DOB:  19-Jul-1950   MRN:  [id number]  PCP:  [name], MD    Chief Complaint: Vaginal Bleeding   History of Present Illness:  Maria [name] is a 70 y.o. very pleasant female patient who presents with the following:  Pt here today with concern of possible PMB Prediabetes, GERD Last seen by myself in April  covid booster- done shingrix 2nd dose   She was noted to have trace hematuria in April- we have made a referral to urology but she has not been seen yet She noted blood from her vagina as of this past Friday She is wearing a panty liner right now  She has seen GYN and had some sort of polyp removed "from my uterus" about 7 years ago in another location  About 2 years ago she noted pelvic pain, she had an [place] by her local gynecologist at [facility]- Dr Linda Hedges  Patient Active Problem List   Diagnosis Date Noted  . GERD (gastroesophageal reflux disease) 12/07/2016  . Prediabetes 10/13/2015  . Reflux esophagitis 11/20/2014  . NONSPECIFIC ABN FINDING RAD & OTH EXAM GI TRACT 09/19/2009    Past Medical History:  Diagnosis Date  . Arthritis   . Colon polyps   . Diverticulosis   . GERD (gastroesophageal reflux disease)   . Hemorrhoids, external without complications   . Hemorrhoids, internal   . HH (hiatus hernia) 2014  . Hypertension   . Osteoporosis    pre  . Reflux esophagitis   . Tubular adenoma of colon     Past Surgical History:  Procedure Laterality Date  . COLONOSCOPY     [id number]  . ESOPHAGEAL MANOMETRY N/A 12/13/2014   Procedure: ESOPHAGEAL MANOMETRY (EM);  Surgeon: Carol Ada, MD;  Location: [facility];  Service: Endoscopy;  Laterality: N/A;  . POLYPECTOMY    . SPINE SURGERY     Neck  . UPPER GASTROINTESTINAL ENDOSCOPY      Social History   Tobacco Use  . Smoking  status: Never Smoker  . Smokeless tobacco: Never Used  Vaping Use  . Vaping Use: Never used  Substance Use Topics  . Alcohol use: No    Alcohol/week: 0.0 standard drinks  . Drug use: No    Family History  Problem Relation Age of Onset  . Hypertension Father   . Arthritis Brother   . Colon cancer Neg Hx   . Rectal cancer Neg Hx   . Esophageal cancer Neg Hx   . Stomach cancer Neg Hx   . Pancreatic cancer Neg Hx   . Prostate cancer Neg Hx   . Colon polyps Neg Hx     No Known Allergies  Medication list has been reviewed and updated.  Current Outpatient Medications on File Prior to Visit  Medication Sig Dispense Refill  . acetaminophen (TYLENOL) 325 MG tablet Take 650 mg by mouth every 6 (six) hours as needed.    Marland Kitchen b complex vitamins tablet Take 1 tablet by mouth daily.    . celecoxib (CELEBREX) 100 MG capsule Take 1 capsule (100 mg total) by mouth 2 (two) times daily. Use as needed for arthritis pain 60 capsule 6  . Cholecalciferol (VITAMIN D3) 1.25 MG (50000 UT) CAPS Take 1 weekly for 12 weeks 12 capsule 0  . hydrochlorothiazide (  HYDRODIURIL) 12.5 MG tablet Take 1 tablet (12.5 mg total) by mouth daily. 90 tablet 3  . Multiple Vitamin (MULTIVITAMIN) tablet Take 1 tablet by mouth daily.    Marland Kitchen omeprazole (PRILOSEC) 20 MG capsule Take 1 capsule (20 mg total) by mouth daily. Please take with water only and take at least 30 minutes before eating. 90 capsule 3   No current facility-administered medications on file prior to visit.    Review of Systems:  As per HPI- otherwise negative.   Physical Examination: Vitals:   08/13/20 1316  BP: 132/84  Pulse: (!) 59  Temp: (!) 97.1 F (36.2 C)  SpO2: 99%   Vitals:   08/13/20 1316  Weight: 180 lb 6.4 oz (81.8 kg)   Body mass index is 29.12 kg/m. Ideal Body Weight:    GEN: no acute distress.  Overweight, looks well  HEENT: Atraumatic, Normocephalic.  Ears and Nose: No external deformity. CV: RRR, No M/G/R. No JVD. No  thrill. No extra heart sounds. PULM: CTA B, no wheezes, crackles, rhonchi. No retractions. No resp. distress. No accessory muscle use. ABD: S, NT, ND, +BS. No rebound. No HSM. EXTR: No c/c/e PSYCH: Normally interactive. Conversant.  Pelvic exam- normal external genitalia On speculum exam pt does have dark blood in her vaginal vault coming from her cervix  I do not see any lesions or other explanation for blood other than uterine bleeding Assessment and Plan: Postmenopausal bleeding  Patient seen today with postmenopausal bleeding.  I discussed with her in detail, explained that it is essential for her to see gynecology due to concern of possible endometrial cancer. I called her gynecology office[facility]- however they refused to schedule the patient until she calls them to resolve a billing situation.   I called the patient and let her know.  Advised that if she is not able to resolve this billing situation and if she cannot be seen, we can send her to a different office due to concern of potential endometrial cancer This visit occurred during the SARS-CoV-2 public health emergency.  Safety protocols were in place, including screening questions prior to the visit, additional usage of staff PPE, and extensive cleaning of exam room while observing appropriate contact time as indicated for disinfecting solutions.    Signed Lamar Blinks, MD

## 2020-08-13 ENCOUNTER — Encounter: Payer: Self-pay | Admitting: Family Medicine

## 2020-08-13 ENCOUNTER — Other Ambulatory Visit: Payer: Self-pay

## 2020-08-13 ENCOUNTER — Ambulatory Visit: Payer: Medicare PPO | Admitting: Family Medicine

## 2020-08-13 VITALS — BP 132/84 | HR 59 | Temp 97.1°F | Wt 180.4 lb

## 2020-08-13 DIAGNOSIS — N950 Postmenopausal bleeding: Secondary | ICD-10-CM | POA: Diagnosis not present

## 2020-08-13 NOTE — Patient Instructions (Signed)
It was good to see you today- thank you for letting us know about this bleeding!   We will get you see by your GYN/ Dr Lynnette Caffey' office asap Please let me know if they don't contact you in the next few days about your appt

## 2020-08-14 DIAGNOSIS — N950 Postmenopausal bleeding: Secondary | ICD-10-CM | POA: Diagnosis not present

## 2020-09-18 ENCOUNTER — Telehealth: Payer: Self-pay | Admitting: Family Medicine

## 2020-09-18 NOTE — Telephone Encounter (Signed)
Attempted to schedule AWV. Unable to LVM.  Will try at later time.  Mail box full 

## 2020-09-24 ENCOUNTER — Other Ambulatory Visit: Payer: Self-pay | Admitting: Family Medicine

## 2020-09-24 ENCOUNTER — Encounter: Payer: Self-pay | Admitting: Family Medicine

## 2020-09-24 DIAGNOSIS — E559 Vitamin D deficiency, unspecified: Secondary | ICD-10-CM

## 2020-09-24 NOTE — Telephone Encounter (Signed)
Vit D 50000 iu refill request. Do we send it again or should pt take the vit D otc 2000 iu's now.

## 2020-11-11 ENCOUNTER — Encounter: Payer: Self-pay | Admitting: Family Medicine

## 2020-11-17 ENCOUNTER — Encounter: Payer: Self-pay | Admitting: Family Medicine

## 2020-12-10 ENCOUNTER — Encounter: Payer: Self-pay | Admitting: Family Medicine

## 2020-12-10 DIAGNOSIS — U071 COVID-19: Secondary | ICD-10-CM

## 2020-12-10 MED ORDER — MOLNUPIRAVIR EUA 200MG CAPSULE: 4.0000 | ORAL_CAPSULE | Freq: Two times a day (BID) | ORAL | 0 refills | Status: AC

## 2020-12-10 NOTE — Telephone Encounter (Signed)
Pt would like to try an antiviral. She asks to use [facility] [address]   She says she has been pushing fluids because last night the nausea/ vomiting picked up.

## 2020-12-11 ENCOUNTER — Other Ambulatory Visit: Payer: Self-pay

## 2020-12-11 ENCOUNTER — Telehealth (INDEPENDENT_AMBULATORY_CARE_PROVIDER_SITE_OTHER): Payer: Medicare Other | Admitting: Family Medicine

## 2020-12-11 DIAGNOSIS — U071 COVID-19: Secondary | ICD-10-CM

## 2020-12-11 NOTE — Progress Notes (Signed)
[place] at [facility] [address] [place], [place] [address] [phone]  Date:  12/11/2020   Name:  Maria [name]   DOB:  09/12/1950   MRN:  [id number]  PCP:  [name], MD    Chief Complaint: Covid Positive (Covid+9/20: fatigue, no taste and smell. Molnupiravir sent in 12/10/20.)   History of Present Illness:  Maria [name] is a 70 y.o. very pleasant female patient who presents with the following:  Virtual visit today for covid 19 Pt location is home and I am at office Pt ID confirmed with 2 factors, pt and myself are present on the call today She gives consent for a virtual visit today  She got sx the day after she got her booster- so a week ago today  She is taking antivirals - started yesterday- and feel like she is improving  She vomited once but this has now resolved  Her breathing is ok- no SOB She did have a temp up to about 99 a few days ago- yesterday back to normal  She had some diarrhea after taking her antiviral dose   Patient Active Problem List   Diagnosis Date Noted   GERD (gastroesophageal reflux disease) 12/07/2016   Prediabetes 10/13/2015   Reflux esophagitis 11/20/2014   NONSPECIFIC ABN FINDING RAD & OTH EXAM GI TRACT 09/19/2009    Past Medical History:  Diagnosis Date   Arthritis    Colon polyps    Diverticulosis    GERD (gastroesophageal reflux disease)    Hemorrhoids, external without complications    Hemorrhoids, internal    HH (hiatus hernia) 2014   Hypertension    Osteoporosis    pre   Reflux esophagitis    Tubular adenoma of colon     Past Surgical History:  Procedure Laterality Date   COLONOSCOPY     [id number]   ESOPHAGEAL MANOMETRY N/A 12/13/2014   Procedure: ESOPHAGEAL MANOMETRY (EM);  Surgeon: Carol Ada, MD;  Location: [facility];  Service: Endoscopy;  Laterality: N/A;   POLYPECTOMY     SPINE SURGERY     Neck   UPPER GASTROINTESTINAL ENDOSCOPY       Social History   Tobacco Use   Smoking status: Never   Smokeless tobacco: Never  Vaping Use   Vaping Use: Never used  Substance Use Topics   Alcohol use: No    Alcohol/week: 0.0 standard drinks   Drug use: No    Family History  Problem Relation Age of Onset   Hypertension Father    Arthritis Brother    Colon cancer Neg Hx    Rectal cancer Neg Hx    Esophageal cancer Neg Hx    Stomach cancer Neg Hx    Pancreatic cancer Neg Hx    Prostate cancer Neg Hx    Colon polyps Neg Hx     No Known Allergies  Medication list has been reviewed and updated.  Current Outpatient Medications on File Prior to Visit  Medication Sig Dispense Refill   acetaminophen (TYLENOL) 325 MG tablet Take 650 mg by mouth every 6 (six) hours as needed.     b complex vitamins tablet Take 1 tablet by mouth daily.     celecoxib (CELEBREX) 100 MG capsule Take 1 capsule (100 mg total) by mouth 2 (two) times daily. Use as needed for arthritis pain 60 capsule 6   Cholecalciferol (VITAMIN D3) 1.25 MG (50000 UT) CAPS Take 1 weekly  for 12 weeks 12 capsule 0   hydrochlorothiazide (HYDRODIURIL) 12.5 MG tablet Take 1 tablet (12.5 mg total) by mouth daily. 90 tablet 3   molnupiravir EUA (LAGEVRIO) 200 mg CAPS capsule Take 4 capsules (800 mg total) by mouth 2 (two) times daily for 5 days. 40 capsule 0   Multiple Vitamin (MULTIVITAMIN) tablet Take 1 tablet by mouth daily.     omeprazole (PRILOSEC) 20 MG capsule Take 1 capsule (20 mg total) by mouth daily. Please take with water only and take at least 30 minutes before eating. 90 capsule 3   No current facility-administered medications on file prior to visit.    Review of Systems:  As per HPI- otherwise negative.   Physical Examination: There were no vitals filed for this visit. There were no vitals filed for this visit. There is no height or weight on file to calculate BMI. Ideal Body Weight:     Pulse 68, oxygen 98 per patient home pulse oximeter Pt  observed via mychart video- she looks well and her normal self No SOB is noted   Assessment and Plan: COVID-19 Patient seen virtually today for concern of COVID-19.  Video used for entirety of visit today.  I started her on oral antivirals yesterday, she feels that she is already improving.  We went over time duration for at home quarantine and mask wearing.  She will seek care if any other concerns, to hospital with any distress  Signed Lamar Blinks, MD

## 2021-01-07 DIAGNOSIS — H2513 Age-related nuclear cataract, bilateral: Secondary | ICD-10-CM | POA: Diagnosis not present

## 2021-01-07 DIAGNOSIS — H43813 Vitreous degeneration, bilateral: Secondary | ICD-10-CM | POA: Diagnosis not present

## 2021-01-28 LAB — HEMOGLOBIN A1C: Hemoglobin A1C: 6.4

## 2021-02-03 DIAGNOSIS — R7303 Prediabetes: Secondary | ICD-10-CM | POA: Diagnosis not present

## 2021-02-03 DIAGNOSIS — I10 Essential (primary) hypertension: Secondary | ICD-10-CM | POA: Diagnosis not present

## 2021-02-17 NOTE — Progress Notes (Deleted)
Subjective:   Maria [name] is a 70 y.o. female who presents for Medicare Annual (Subsequent) preventive examination.   Review of Systems    ***       Objective:    There were no vitals filed for this visit. There is no height or weight on file to calculate BMI.  Advanced Directives 04/13/2019 11/14/2016 10/01/2016 05/24/2016  Does Patient Have a Medical Advance Directive? Yes No No Yes  Type of Advance Directive Living will - - [employer];Living will  Does patient want to make changes to medical advance directive? No - Patient declined - - -  Copy of [place] in Chart? - - - No - copy requested  Would patient like information on creating a medical advance directive? - - No - Patient declined -    Current Medications (verified) Outpatient Encounter Medications as of 02/19/2021  Medication Sig   acetaminophen (TYLENOL) 325 MG tablet Take 650 mg by mouth every 6 (six) hours as needed.   b complex vitamins tablet Take 1 tablet by mouth daily.   celecoxib (CELEBREX) 100 MG capsule Take 1 capsule (100 mg total) by mouth 2 (two) times daily. Use as needed for arthritis pain   Cholecalciferol (VITAMIN D3) 1.25 MG (50000 UT) CAPS Take 1 weekly for 12 weeks   hydrochlorothiazide (HYDRODIURIL) 12.5 MG tablet Take 1 tablet (12.5 mg total) by mouth daily.   Multiple Vitamin (MULTIVITAMIN) tablet Take 1 tablet by mouth daily.   omeprazole (PRILOSEC) 20 MG capsule Take 1 capsule (20 mg total) by mouth daily. Please take with water only and take at least 30 minutes before eating.   No facility-administered encounter medications on file as of 02/19/2021.    Allergies (verified) Patient has no known allergies.   History: Past Medical History:  Diagnosis Date   Arthritis    Colon polyps    Diverticulosis    GERD (gastroesophageal reflux disease)    Hemorrhoids, external without complications    Hemorrhoids, internal    HH (hiatus hernia) 2014    Hypertension    Osteoporosis    pre   Reflux esophagitis    Tubular adenoma of colon    Past Surgical History:  Procedure Laterality Date   COLONOSCOPY     [id number]   ESOPHAGEAL MANOMETRY N/A 12/13/2014   Procedure: ESOPHAGEAL MANOMETRY (EM);  Surgeon: Carol Ada, MD;  Location: [facility];  Service: Endoscopy;  Laterality: N/A;   POLYPECTOMY     SPINE SURGERY     Neck   UPPER GASTROINTESTINAL ENDOSCOPY     Family History  Problem Relation Age of Onset   Hypertension Father    Arthritis Brother    Colon cancer Neg Hx    Rectal cancer Neg Hx    Esophageal cancer Neg Hx    Stomach cancer Neg Hx    Pancreatic cancer Neg Hx    Prostate cancer Neg Hx    Colon polyps Neg Hx    Social History   Socioeconomic History   Marital status: Widowed    Spouse name: Not on file   Number of children: 3   Years of education: 9   Highest education level: Not on file  Occupational History   Occupation: Retired  Tobacco Use   Smoking status: Never   Smokeless tobacco: Never  Vaping Use   Vaping Use: Never used  Substance and Sexual Activity   Alcohol use: No    Alcohol/week: 0.0 standard drinks  Drug use: No   Sexual activity: Never  Other Topics Concern   Not on file  Social History Narrative   Not on file   Social Determinants of Health   Financial Resource Strain: Not on file  Food Insecurity: Not on file  Transportation Needs: Not on file  Physical Activity: Not on file  Stress: Not on file  Social Connections: Not on file    Tobacco Counseling Counseling given: Not Answered   Clinical Intake:                 Diabetic?No         Activities of Daily Living In your present state of health, do you have any difficulty performing the following activities: 08/13/2020  Hearing? N  Vision? N  Difficulty concentrating or making decisions? N  Walking or climbing stairs? N  Dressing or bathing? N  Doing errands, shopping? N  Some recent data  might be hidden    Patient Care Team: Copland, Gay Filler, MD as PCP - General (Family Medicine) Linda Hedges, DO as Consulting Physician (Obstetrics and Gynecology)  Indicate any recent Medical Services you may have received from other than [facility] providers in the past year (date may be approximate).     Assessment:   This is a routine wellness examination for Maria [name].  Hearing/Vision screen No results found.  Dietary issues and exercise activities discussed:     Goals Addressed   None    Depression Screen PHQ 2/9 Scores 07/02/2020 04/13/2019 05/10/2018 05/24/2016 10/13/2015 09/15/2015 02/05/2015  PHQ - 2 Score 0 0 0 0 0 0 0    Fall Risk Fall Risk  07/02/2020 04/13/2019 05/10/2018 05/24/2016 10/13/2015  Falls in the past year? 0 0 0 No No  Number falls in past yr: 0 0 - - -  Injury with Fall? 0 0 - - -  Follow up - Education provided;Falls prevention discussed - - -    FALL RISK PREVENTION PERTAINING TO THE HOME:  Any stairs in or around the home? {YES/NO:21197} If so, are there any without handrails? {YES/NO:21197} Home free of loose throw rugs in walkways, pet beds, electrical cords, etc? {YES/NO:21197} Adequate lighting in your home to reduce risk of falls? {YES/NO:21197}  ASSISTIVE DEVICES UTILIZED TO PREVENT FALLS:  Life alert? {YES/NO:21197} Use of a cane, walker or w/c? {YES/NO:21197} Grab bars in the bathroom? {YES/NO:21197} Shower chair or bench in shower? {YES/NO:21197} Elevated toilet seat or a handicapped toilet? {YES/NO:21197}  TIMED UP AND GO:  Was the test performed? {YES/NO:21197}.  Length of time to ambulate 10 feet: *** sec.   {Appearance of Gait:2101803}  Cognitive Function: MMSE - Mini Mental State Exam 05/24/2016  Orientation to time 5  Orientation to Place 5  Registration 3  Attention/ Calculation 5  Recall 2  Language- name 2 objects 2  Language- repeat 1  Language- follow 3 step command 3  Language- read & follow direction 1  Write a sentence  1  Copy design 1  Total score 29        Immunizations Immunization History  Administered Date(s) Administered   Fluad Quad(high Dose 65+) 11/27/2018   Influenza Split 01/17/2013, 12/21/2014   Influenza, High Dose Seasonal PF 12/17/2015, 01/08/2017   Influenza,inj,Quad PF,6+ Mos 12/21/2018   Influenza,inj,quad, With Preservative 12/31/2019   Influenza-Unspecified 12/31/2017   PFIZER(Purple Top)SARS-COV-2 Vaccination 07/20/2019, 08/13/2019   Pneumococcal Conjugate-13 02/05/2015, 01/31/2016, 01/24/2020   Pneumococcal Polysaccharide-23 01/31/2017   Td 02/05/2015   Zoster Recombinat (Shingrix) 12/31/2017  TDAP status: Up to date  {Flu Vaccine status:2101806}  Pneumococcal vaccine status: Up to date  {Covid-19 vaccine status:2101808}  Qualifies for Shingles Vaccine? {YES/NO:21197}  Zostavax completed {YES/NO:21197}  {Shingrix Completed?:2101804}  Screening Tests Health Maintenance  Topic Date Due   Zoster Vaccines- Shingrix (2 of 2) 02/25/2018   COVID-19 Vaccine (4 - Booster for Pfizer series) 03/29/2020   INFLUENZA VACCINE  10/20/2020   MAMMOGRAM  07/18/2021   COLONOSCOPY (Pts 45-86yrs Insurance coverage will need to be confirmed)  12/06/2024   TETANUS/TDAP  02/04/2025   Pneumonia Vaccine 101+ Years old  Completed   DEXA SCAN  Completed   Hepatitis C Screening  Completed   HPV VACCINES  Aged Out    Health Maintenance  Health Maintenance Due  Topic Date Due   Zoster Vaccines- Shingrix (2 of 2) 02/25/2018   COVID-19 Vaccine (4 - Booster for Pfizer series) 03/29/2020   INFLUENZA VACCINE  10/20/2020    Colorectal cancer screening: Type of screening: Colonoscopy. Completed 12/07/2019. Repeat every 5 years  {Mammogram status:21018020}  {Bone Density status:21018021}  Lung Cancer Screening: (Low Dose CT Chest recommended if Age 23-80 years, 30 pack-year currently smoking OR have quit w/in 15years.) does not qualify.     Additional Screening:  Hepatitis C  Screening: Completed 02/05/2015  Vision Screening: Recommended annual ophthalmology exams for early detection of glaucoma and other disorders of the eye. Is the patient up to date with their annual eye exam?  {YES/NO:21197} Who is the provider or what is the name of the office in which the patient attends annual eye exams? *** If pt is not established with a provider, would they like to be referred to a provider to establish care? {YES/NO:21197}.   Dental Screening: Recommended annual dental exams for proper oral hygiene  Community Resource Referral / Chronic Care Management: CRR required this visit?  {YES/NO:21197}  CCM required this visit?  {YES/NO:21197}     Plan:     I have personally reviewed and noted the following in the patient's chart:   Medical and social history Use of alcohol, tobacco or illicit drugs  Current medications and supplements including opioid prescriptions.  Functional ability and status Nutritional status Physical activity Advanced directives List of other physicians Hospitalizations, surgeries, and ER visits in previous 12 months Vitals Screenings to include cognitive, depression, and falls Referrals and appointments  In addition, I have reviewed and discussed with patient certain preventive protocols, quality metrics, and best practice recommendations. A written personalized care plan for preventive services as well as general preventive health recommendations were provided to patient.     Marta Antu, LPN   16/12/9602  Nurse Health Advisor  Nurse Notes: ***

## 2021-02-19 ENCOUNTER — Ambulatory Visit: Payer: Medicare Other

## 2021-02-27 ENCOUNTER — Ambulatory Visit: Payer: Medicare Other

## 2021-03-26 ENCOUNTER — Encounter: Payer: Self-pay | Admitting: Family

## 2021-03-26 ENCOUNTER — Ambulatory Visit (INDEPENDENT_AMBULATORY_CARE_PROVIDER_SITE_OTHER): Payer: Medicare Other | Admitting: Family

## 2021-03-26 ENCOUNTER — Other Ambulatory Visit: Payer: Self-pay

## 2021-03-26 ENCOUNTER — Ambulatory Visit (HOSPITAL_BASED_OUTPATIENT_CLINIC_OR_DEPARTMENT_OTHER)
Admission: RE | Admit: 2021-03-26 | Discharge: 2021-03-26 | Disposition: A | Discharge status: Home / Self Care | Payer: Medicare Other | Source: Ambulatory Visit | Attending: Family | Admitting: Family

## 2021-03-26 VITALS — BP 150/80 | HR 69 | Temp 98.2°F | Ht 66.0 in | Wt 191.6 lb

## 2021-03-26 DIAGNOSIS — E559 Vitamin D deficiency, unspecified: Secondary | ICD-10-CM | POA: Diagnosis not present

## 2021-03-26 DIAGNOSIS — M79672 Pain in left foot: Secondary | ICD-10-CM | POA: Insufficient documentation

## 2021-03-26 DIAGNOSIS — M79671 Pain in right foot: Secondary | ICD-10-CM

## 2021-03-26 DIAGNOSIS — M25551 Pain in right hip: Secondary | ICD-10-CM | POA: Diagnosis not present

## 2021-03-26 DIAGNOSIS — M1990 Unspecified osteoarthritis, unspecified site: Secondary | ICD-10-CM

## 2021-03-26 DIAGNOSIS — R252 Cramp and spasm: Secondary | ICD-10-CM | POA: Diagnosis not present

## 2021-03-26 DIAGNOSIS — I10 Essential (primary) hypertension: Secondary | ICD-10-CM | POA: Diagnosis not present

## 2021-03-26 LAB — COMPREHENSIVE METABOLIC PANEL
ALT: 20 U/L (ref 0–35)
AST: 18 U/L (ref 0–37)
Albumin: 4.3 g/dL (ref 3.5–5.2)
Alkaline Phosphatase: 91 U/L (ref 39–117)
BUN: 18 mg/dL (ref 6–23)
CO2: 30 mEq/L (ref 19–32)
Calcium: 9.9 mg/dL (ref 8.4–10.5)
Chloride: 101 mEq/L (ref 96–112)
Creatinine, Ser: 0.92 mg/dL (ref 0.40–1.20)
GFR: 62.73 mL/min (ref >60.00)
Glucose, Bld: 95 mg/dL (ref 70–99)
Potassium: 4.5 mEq/L (ref 3.5–5.1)
Sodium: 138 mEq/L (ref 135–145)
Total Bilirubin: 0.5 mg/dL (ref 0.2–1.2)
Total Protein: 7.2 g/dL (ref 6.0–8.3)

## 2021-03-26 LAB — CBC WITH DIFFERENTIAL/PLATELET
Basophils Absolute: 0 10*3/uL (ref 0.0–0.1)
Basophils Relative: 0.2 % (ref 0.0–3.0)
Eosinophils Absolute: 0.3 10*3/uL (ref 0.0–0.7)
Eosinophils Relative: 3.7 % (ref 0.0–5.0)
HCT: 39.4 % (ref 36.0–46.0)
Hemoglobin: 12.7 g/dL (ref 12.0–15.0)
Lymphocytes Relative: 23.7 % (ref 12.0–46.0)
Lymphs Abs: 1.7 10*3/uL (ref 0.7–4.0)
MCHC: 32.2 g/dL (ref 30.0–36.0)
MCV: 91.3 fl (ref 78.0–100.0)
Monocytes Absolute: 0.6 10*3/uL (ref 0.1–1.0)
Monocytes Relative: 8.4 % (ref 3.0–12.0)
Neutro Abs: 4.6 10*3/uL (ref 1.4–7.7)
Neutrophils Relative %: 64 % (ref 43.0–77.0)
Platelets: 284 10*3/uL (ref 150.0–400.0)
RBC: 4.31 Mil/uL (ref 3.87–5.11)
RDW: 13.3 % (ref 11.5–15.5)
WBC: 7.2 10*3/uL (ref 4.0–10.5)

## 2021-03-26 LAB — VITAMIN D 25 HYDROXY (VIT D DEFICIENCY, FRACTURES): VITD: 22.57 ng/mL — ABNORMAL LOW (ref 30.00–100.00)

## 2021-03-26 LAB — VITAMIN B12: Vitamin B-12: 178 pg/mL — ABNORMAL LOW (ref 211–911)

## 2021-03-26 MED ORDER — CELECOXIB 100 MG PO CAPS: 100.0000 mg | ORAL_CAPSULE | Freq: Two times a day (BID) | ORAL | 1 refills | Status: DC

## 2021-03-26 MED ORDER — HYDROCHLOROTHIAZIDE 12.5 MG PO TABS: 12.5000 mg | ORAL_TABLET | Freq: Every day | ORAL | 0 refills | Status: DC

## 2021-03-26 NOTE — Progress Notes (Signed)
°Maria [name] is a 71 y.o. female with the following history as recorded in EpicCare:  °Patient Active Problem List  ° Diagnosis Date Noted  ° GERD (gastroesophageal reflux disease) 12/07/2016  ° Prediabetes 10/13/2015  ° Reflux esophagitis 11/20/2014  ° NONSPECIFIC ABN FINDING RAD & OTH EXAM GI TRACT 09/19/2009  °  °Current Outpatient Medications  °Medication Sig Dispense Refill  ° acetaminophen (TYLENOL) 325 MG tablet Take 650 mg by mouth every 6 (six) hours as needed.    ° b complex vitamins tablet Take 1 tablet by mouth daily.    ° Multiple Vitamin (MULTIVITAMIN) tablet Take 1 tablet by mouth daily.    ° omeprazole (PRILOSEC) 20 MG capsule Take 1 capsule (20 mg total) by mouth daily. Please take with water only and take at least 30 minutes before eating. 90 capsule 3  ° celecoxib (CELEBREX) 100 MG capsule Take 1 capsule (100 mg total) by mouth 2 (two) times daily. Use as needed for arthritis pain 60 capsule 1  ° Cholecalciferol (VITAMIN D3) 1.25 MG (50000 UT) CAPS Take 1 weekly for 12 weeks (Patient not taking: Reported on 03/26/2021) 12 capsule 0  ° hydrochlorothiazide (HYDRODIURIL) 12.5 MG tablet Take 1 tablet (12.5 mg total) by mouth daily. 90 tablet 0  ° °No current facility-administered medications for this visit.  °  °Allergies: Patient has no known allergies.  °Past Medical History:  °Diagnosis Date  ° Arthritis   ° Colon polyps   ° Diverticulosis   ° GERD (gastroesophageal reflux disease)   ° Hemorrhoids, external without complications   ° Hemorrhoids, internal   ° HH (hiatus hernia) 2014  ° Hypertension   ° Osteoporosis   ° pre  ° Reflux esophagitis   ° Tubular adenoma of colon   °  °Past Surgical History:  °Procedure Laterality Date  ° COLONOSCOPY    ° [id number]  ° ESOPHAGEAL MANOMETRY N/A 12/13/2014  ° Procedure: ESOPHAGEAL MANOMETRY (EM);  Surgeon: Patrick Hung, MD;  Location: [facility];  Service: Endoscopy;  Laterality: N/A;  ° POLYPECTOMY    ° SPINE SURGERY    ° Neck  ° UPPER GASTROINTESTINAL  ENDOSCOPY    °  °Family History  °Problem Relation Age of Onset  ° Hypertension Father   ° Arthritis Brother   ° Colon cancer Neg Hx   ° Rectal cancer Neg Hx   ° Esophageal cancer Neg Hx   ° Stomach cancer Neg Hx   ° Pancreatic cancer Neg Hx   ° Prostate cancer Neg Hx   ° Colon polyps Neg Hx   °  °Social History  ° °Tobacco Use  ° Smoking status: Never  ° Smokeless tobacco: Never  °Substance Use Topics  ° Alcohol use: No  °  Alcohol/week: 0.0 standard drinks  °  °Subjective:  °Presents today with concerns for 3 week history of left foot pain; no known history of injury or trauma; has tried soaking with Epsom salt soaks; would like to get X-ray of her foot;  ° °Would like to get her Vitamin D level checked today- also concerned about leg cramps at night; ° °Not taking her blood pressure medication regularly;  ° ° °Objective:  °Vitals:  ° 03/26/21 1311  °BP: (!) 150/80  °Pulse: 69  °Temp: 98.2 °F (36.8 °C)  °TempSrc: Oral  °SpO2: 98%  °Weight: 191 lb 9.6 oz (86.9 kg)  °Height: 5' 6" (1.676 m)  °  °General: Well developed, well nourished, in no acute distress  °Skin : Warm   Warm and dry.  Head: Normocephalic and atraumatic  Eyes: Sclera and conjunctiva clear; pupils round and reactive to light; extraocular movements intact  Ears: External normal; canals clear; tympanic membranes normal  Oropharynx: Pink, supple. No suspicious lesions  Neck: Supple without thyromegaly, adenopathy  Lungs: Respirations unlabored;  Musculoskeletal: No deformities; no active joint inflammation  Extremities: No edema, cyanosis, clubbing  Vessels: Symmetric bilaterally  Neurologic: Alert and oriented; speech intact; face symmetrical; moves all extremities well; CNII-XII intact without focal deficit   Assessment:  1. Vitamin D deficiency   2. Left foot pain   3. Arthritis   4. Right hip pain   5. Right foot pain   6. Essential hypertension   7. Leg cramps     Plan:  Check Vitamin D level per patient request; Suspect muscular;  will update Xray per patient request; refill given on Celebrex 100 mg bid; follow up to be determined; 6.   Stressed need to take her blood pressure medication daily; refill updated;  7.   Check CBC, CMP, B12;   This visit occurred during the SARS-CoV-2 public health emergency.  Safety protocols were in place, including screening questions prior to the visit, additional usage of staff PPE, and extensive cleaning of exam room while observing appropriate contact time as indicated for disinfecting solutions.    Return in about 2 months (around 05/24/2021) for CPE with Dr. Lorelei Pont.  Orders Placed This Encounter  Procedures   DG Foot Complete Left    Standing Status:   Future    Number of Occurrences:   1    Standing Expiration Date:   03/26/2022    Order Specific Question:   Reason for Exam (SYMPTOM  OR DIAGNOSIS REQUIRED)    Answer:   left foot pain    Order Specific Question:   Preferred imaging location?    Answer:   [facility]   Vitamin D (25 hydroxy)   Comp Met (CMET)   CBC with Differential/Platelet   B12    Requested Prescriptions   Signed Prescriptions Disp Refills   celecoxib (CELEBREX) 100 MG capsule 60 capsule 1    Sig: Take 1 capsule (100 mg total) by mouth 2 (two) times daily. Use as needed for arthritis pain   hydrochlorothiazide (HYDRODIURIL) 12.5 MG tablet 90 tablet 0    Sig: Take 1 tablet (12.5 mg total) by mouth daily.

## 2021-03-27 ENCOUNTER — Telehealth: Payer: Self-pay

## 2021-03-27 ENCOUNTER — Encounter: Payer: Self-pay | Admitting: Family Medicine

## 2021-03-27 ENCOUNTER — Telehealth: Payer: Self-pay | Admitting: Family Medicine

## 2021-03-27 ENCOUNTER — Other Ambulatory Visit: Payer: Self-pay | Admitting: Family

## 2021-03-27 DIAGNOSIS — M79672 Pain in left foot: Secondary | ICD-10-CM

## 2021-03-27 DIAGNOSIS — E559 Vitamin D deficiency, unspecified: Secondary | ICD-10-CM

## 2021-03-27 MED ORDER — VITAMIN D3 1.25 MG (50000 UT) PO CAPS: ORAL_CAPSULE | ORAL | 0 refills | Status: DC

## 2021-03-27 NOTE — Telephone Encounter (Signed)
Pt sent a mychart message with questions about her results and wanting Dr Lorelei Pont to review them. I let her know that Mickel Baas has not had a chance to review them just yet.   Please advise.

## 2021-03-27 NOTE — Telephone Encounter (Signed)
Pt called to go over imaging results. Please advise.

## 2021-03-27 NOTE — Telephone Encounter (Signed)
Called pt to let her know that she will not have a copay. Had to leave a VM.

## 2021-03-27 NOTE — Telephone Encounter (Signed)
Went over labs with pt. She asks if she would have to pay a copay at each of her nurse appointments for her 4 weekly B12 appointments.

## 2021-04-01 ENCOUNTER — Ambulatory Visit (INDEPENDENT_AMBULATORY_CARE_PROVIDER_SITE_OTHER): Payer: Medicare Other | Admitting: *Deleted

## 2021-04-01 DIAGNOSIS — E539 Vitamin B deficiency, unspecified: Secondary | ICD-10-CM | POA: Diagnosis not present

## 2021-04-01 MED ORDER — CYANOCOBALAMIN 1000 MCG/ML IJ SOLN
1000.0000 ug | Freq: Once | INTRAMUSCULAR | Status: AC
  Administered 2021-04-01: 1000 ug via INTRAMUSCULAR

## 2021-04-01 NOTE — Progress Notes (Addendum)
Patient here for first of four weekly injection per physician order.  Original order: 03/27/21 lab- get B12 shots weekly for 4 weeks.  Injection given right deltoid and patient tolerated well.  Next injection scheduled for 04/08/21.

## 2021-04-06 NOTE — Telephone Encounter (Signed)
Pt was made aware she will not have a co-pay at each nurse visit appt.

## 2021-04-07 ENCOUNTER — Ambulatory Visit: Payer: Medicare Other | Admitting: Podiatry

## 2021-04-07 ENCOUNTER — Telehealth: Payer: Self-pay | Admitting: *Deleted

## 2021-04-07 ENCOUNTER — Other Ambulatory Visit: Payer: Self-pay

## 2021-04-07 ENCOUNTER — Ambulatory Visit (INDEPENDENT_AMBULATORY_CARE_PROVIDER_SITE_OTHER): Payer: Medicare Other

## 2021-04-07 ENCOUNTER — Encounter: Payer: Self-pay | Admitting: Podiatry

## 2021-04-07 DIAGNOSIS — M722 Plantar fascial fibromatosis: Secondary | ICD-10-CM

## 2021-04-07 DIAGNOSIS — M79672 Pain in left foot: Secondary | ICD-10-CM

## 2021-04-07 NOTE — Patient Instructions (Signed)

## 2021-04-07 NOTE — Telephone Encounter (Signed)
Patient is calling because she can't afford the inserts recommended.the cost is fifty bucks. Is there something else less expensive?Please advise.

## 2021-04-07 NOTE — Progress Notes (Signed)
°  Subjective:  Patient ID: Maria [name], female    DOB: [birth year],   MRN: [id number]  Chief Complaint  Patient presents with   Foot Pain    Pt reports she has been dealing with pain at the insole of her foot- 2 months already- seen PCP a week ago and was referred over     71 y.o. female presents for concern of pain in the left foot for about two months. Relates no injuries. Pain is mostly in the insole area. Was seen by her PCP  . Denies any other pedal complaints. Denies n/v/f/c.   Past Medical History:  Diagnosis Date   Arthritis    Colon polyps    Diverticulosis    GERD (gastroesophageal reflux disease)    Hemorrhoids, external without complications    Hemorrhoids, internal    HH (hiatus hernia) 2014   Hypertension    Osteoporosis    pre   Reflux esophagitis    Tubular adenoma of colon     Objective:  Physical Exam: Vascular: DP/PT pulses 2/4 bilateral. CFT <3 seconds. Normal hair growth on digits. No edema.  Skin. No lacerations or abrasions bilateral feet.  Musculoskeletal: MMT 5/5 bilateral lower extremities in DF, PF, Inversion and Eversion. Deceased ROM in DF of ankle joint. Mild tenderness noted through the mid plantar fascia medially. Tight fascia noted. No pain to palpation of medial calcaneal tubercle on the left.  Neurological: Sensation intact to light touch.   Assessment:   1. Plantar fasciitis of left foot      Plan:  Patient was evaluated and treated and all questions answered. Discussed plantar fasciitis with patient.  X-rays reviewed and discussed with patient. No acute fractures or dislocations noted. Mild spurring noted at inferior calcaneus as well as degenerative changes in the midfoot.   Discussed treatment options including, ice, NSAIDS, supportive shoes, bracing, and stretching. Stretching exercises provided to be done on a daily basis.   Continue with celebrex as needed.  Discussed inserts and supportive shoes.    Follow-up as needed.      Lorenda Peck, DPM

## 2021-04-08 ENCOUNTER — Ambulatory Visit: Payer: Medicare Other

## 2021-04-08 NOTE — Progress Notes (Deleted)
Patient here for 2/4 weekly b12 injection per physician order.   Original order: 03/27/21 lab- get B12 shots weekly for 4 weeks.  Injection given in left deltoid and patient tolerated well.  Next injection scheduled for 04/15/21.

## 2021-04-13 NOTE — Telephone Encounter (Signed)
Returned he call to patient, no answer,left vmessage of answer to patient's questions concerning less expensive inserts.

## 2021-04-16 ENCOUNTER — Ambulatory Visit (INDEPENDENT_AMBULATORY_CARE_PROVIDER_SITE_OTHER): Payer: Medicare Other

## 2021-04-16 DIAGNOSIS — E539 Vitamin B deficiency, unspecified: Secondary | ICD-10-CM | POA: Diagnosis not present

## 2021-04-16 MED ORDER — CYANOCOBALAMIN 1000 MCG/ML IJ SOLN
1000.0000 ug | Freq: Once | INTRAMUSCULAR | Status: AC
  Administered 2021-04-16: 1000 ug via INTRAMUSCULAR

## 2021-04-16 NOTE — Progress Notes (Addendum)
Maria [name] is a 71 y.o. female presents to the office today for 2 of 4 weekly B12  injections, per physician's orders.03/27/21 lab- get B12 shots weekly for 4 weeks. Original order:  Cyanocobalamin (med), 1000 mg/ml (dose),  im (route) was administered right deltoid (location) today. Patient tolerated injection.  Patient next injection due: in one week, appt made for 04-23-21.   Shaniko with aboveDenny Peon MD

## 2021-04-21 ENCOUNTER — Other Ambulatory Visit: Payer: Self-pay | Admitting: Podiatry

## 2021-04-21 DIAGNOSIS — M722 Plantar fascial fibromatosis: Secondary | ICD-10-CM

## 2021-04-23 ENCOUNTER — Ambulatory Visit: Payer: Medicare Other

## 2021-04-28 ENCOUNTER — Ambulatory Visit: Payer: Medicare Other

## 2021-04-30 ENCOUNTER — Ambulatory Visit (INDEPENDENT_AMBULATORY_CARE_PROVIDER_SITE_OTHER): Payer: Medicare Other

## 2021-04-30 DIAGNOSIS — E539 Vitamin B deficiency, unspecified: Secondary | ICD-10-CM

## 2021-04-30 MED ORDER — CYANOCOBALAMIN 1000 MCG/ML IJ SOLN
1000.0000 ug | Freq: Once | INTRAMUSCULAR | Status: AC
  Administered 2021-04-30: 1000 ug via INTRAMUSCULAR

## 2021-04-30 NOTE — Progress Notes (Signed)
Maria [name] is a 71 y.o. female presents to the office today for #3 of r4 B12 shot  injections, per physician's orders. Original order: 03-27-2021 Cyanocobalamin (med), 1000 mg/ml (dose),  im (route) was administered left deltoid (location) today. Patient tolerated injection.  Patient next injection due: in one week for #4 of 4 weekly, appt made Yes 05-06-2021.  Jiles Prows

## 2021-05-06 ENCOUNTER — Ambulatory Visit: Payer: Medicare Other

## 2021-05-13 ENCOUNTER — Ambulatory Visit (INDEPENDENT_AMBULATORY_CARE_PROVIDER_SITE_OTHER): Payer: Medicare Other

## 2021-05-13 DIAGNOSIS — E539 Vitamin B deficiency, unspecified: Secondary | ICD-10-CM

## 2021-05-13 MED ORDER — CYANOCOBALAMIN 1000 MCG/ML IJ SOLN
1000.0000 ug | Freq: Once | INTRAMUSCULAR | Status: AC
  Administered 2021-05-13: 1000 ug via INTRAMUSCULAR

## 2021-05-13 NOTE — Progress Notes (Signed)
Maria [name] is a 71 y.o. female presents to the office today for 4 of 4 weekly B12 shot  injections, per physician's orders. Original order: 03-27-2021 Cyanocobalamin (med), 1000 mg/ml (dose),  im (route) was administered left deltoid (location) today. Patient tolerated injection.  Pt will return 06/06/21 with Dr. Lorelei Pont

## 2021-05-24 NOTE — Progress Notes (Addendum)
Therapist, music at [facility] ?[address], [address] ?[place], [place] [address] ?336 [phone] ?Fax [phone] ? ?Date:  05/27/2021  ? ?Name:  Maria [name]   DOB:  [birth year]   MRN:  [id number] ? ?PCP:  [name], MD  ? ? ?Chief Complaint: Annual Exam (Concerns/ questions: pt asks how to proceed with B12 since she has received her last B12 injection. She would also like her Vit D drawn. ) ? ? ?History of Present Illness: ? ?Maria [name] is a 71 y.o. very pleasant female patient who presents with the following: ? ?Pt seen today for a CPE ?Last seen by myself virtually in September when she had covid - she recovered fully  ?Otherwise our last visit was in May for possible PMB- she was referred to Dr Lynnette Caffey.  Patient was not quite sure if she was ever seen, I called and confirm she did have a complete appointment ? ?History of pre-diabetes and vit D, B12 deficiency - found to be low in January of this year  ? ?CMP, vit D and B12 levels done in January ?Otherwise complete labs done in April  ? ?She is doing B12 shots - done about a week ago ?We will check her level today to determine if she is to continue shots or she can go to oral replacement ? ? ?Shingrix ?Covid booster- done  ?Flu shot- done last fall  ?Mammo due -reminded and placed order ? ?Maria [name] states she is overall feeling well, she has no other particular concerns ?She does request to see cardiology.No chest pain, she is exercise without difficulty.  She would just like to have a cardiology evaluation to make sure all is well ?Past Medical History:  ?Diagnosis Date  ? Arthritis   ? Colon polyps   ? Diverticulosis   ? GERD (gastroesophageal reflux disease)   ? Hemorrhoids, external without complications   ? Hemorrhoids, internal   ? [place] (hiatus hernia) 2014  ? Hypertension   ? Osteoporosis   ? pre  ? Reflux esophagitis   ? Tubular adenoma of colon   ? ? ?Past Surgical History:  ?Procedure Laterality Date  ? COLONOSCOPY    ?  [id number]  ? ESOPHAGEAL MANOMETRY N/A 12/13/2014  ? Procedure: ESOPHAGEAL MANOMETRY (EM);  Surgeon: Carol Ada, MD;  Location: [facility];  Service: Endoscopy;  Laterality: N/A;  ? POLYPECTOMY    ? SPINE SURGERY    ? Neck  ? UPPER GASTROINTESTINAL ENDOSCOPY    ? ? ?Social History  ? ?Tobacco Use  ? Smoking status: Never  ? Smokeless tobacco: Never  ?Vaping Use  ? Vaping Use: Never used  ?Substance Use Topics  ? Alcohol use: No  ?  Alcohol/week: 0.0 standard drinks  ? Drug use: No  ? ? ?Family History  ?Problem Relation Age of Onset  ? Hypertension Father   ? Arthritis Brother   ? Colon cancer Neg Hx   ? Rectal cancer Neg Hx   ? Esophageal cancer Neg Hx   ? Stomach cancer Neg Hx   ? Pancreatic cancer Neg Hx   ? Prostate cancer Neg Hx   ? Colon polyps Neg Hx   ? ? ?No Known Allergies ? ?Medication list has been reviewed and updated. ? ?Current Outpatient Medications on File Prior to Visit  ?Medication Sig Dispense Refill  ? acetaminophen (TYLENOL) 325 MG tablet Take 650 mg by mouth every 6 (six) hours as needed.    ?  b complex vitamins tablet Take 1 tablet by mouth daily.    ? celecoxib (CELEBREX) 100 MG capsule Take 1 capsule (100 mg total) by mouth 2 (two) times daily. Use as needed for arthritis pain 60 capsule 1  ? Cholecalciferol (VITAMIN D3) 1.25 MG (50000 UT) CAPS Take 1 weekly for 12 weeks 12 capsule 0  ? hydrochlorothiazide (HYDRODIURIL) 12.5 MG tablet Take 1 tablet (12.5 mg total) by mouth daily. 90 tablet 0  ? Multiple Vitamin (MULTIVITAMIN) tablet Take 1 tablet by mouth daily.    ? omeprazole (PRILOSEC) 40 MG capsule omeprazole 40 mg capsule,delayed release ?  40 mg by oral route.    ? ?No current facility-administered medications on file prior to visit.  ? ? ?Review of Systems: ? ?As per HPI- otherwise negative. ? ? ?Physical Examination: ?Vitals:  ? 05/27/21 1027  ?BP: 124/80  ?Pulse: 68  ?Resp: 18  ?Temp: 97.8 ?F (36.6 ?C)  ?SpO2: 97%  ? ?Vitals:  ? 05/27/21 1027  ?Weight: 175 lb (79.4 kg)  ?Height:  '5\' 6"'$  (1.676 m)  ? ?Body mass index is 28.25 kg/m?. ?Ideal Body Weight: Weight in (lb) to have BMI = 25: 154.6 ? ?GEN: no acute distress.  Mild overweight, looks well ?HEENT: Atraumatic, Normocephalic.  ?Ears and Nose: No external deformity. ?CV: RRR, No M/G/R. No JVD. No thrill. No extra heart sounds. ?PULM: CTA B, no wheezes, crackles, rhonchi. No retractions. No resp. distress. No accessory muscle use. ?EXTR: No c/c/e ?PSYCH: Normally interactive. Conversant.  ? ? ?Assessment and Plan: ?Essential hypertension - Plan: CBC, Ambulatory referral to Cardiology ? ?Screening for deficiency anemia - Plan: CBC ? ?Pre-diabetes - Plan: Hemoglobin A1c ? ?Screening for hyperlipidemia - Plan: Lipid panel ? ?Screening for thyroid disorder - Plan: TSH ? ?Encounter for screening mammogram for malignant neoplasm of breast - Plan: MM 3D SCREEN BREAST BILATERAL ? ?Vitamin B12 deficiency - Plan: Vitamin B12 ? ?Blood pressure under control, continue current HCTZ-Recent electrolytes normal. Referral to cardiology on her request ? ?Will plan further follow- up pending labs.  She is currently taking vitamin B12 injections, if her levels improve we can transition to oral replacement ?Ordered mammo ? ? ?Signed ?Lamar Blinks, MD ? ?Received labs as below, message to patient ? ?Results for orders placed or performed in visit on 05/27/21  ?CBC  ?Result Value Ref Range  ? WBC 8.3 4.0 - 10.5 K/uL  ? RBC 4.54 3.87 - 5.11 Mil/uL  ? Platelets 270.0 150.0 - 400.0 K/uL  ? Hemoglobin 13.5 12.0 - 15.0 g/dL  ? HCT 40.9 36.0 - 46.0 %  ? MCV 90.2 78.0 - 100.0 fl  ? MCHC 33.1 30.0 - 36.0 g/dL  ? RDW 12.5 11.5 - 15.5 %  ?Hemoglobin A1c  ?Result Value Ref Range  ? Hgb A1c MFr Bld 6.3 4.6 - 6.5 %  ?Lipid panel  ?Result Value Ref Range  ? Cholesterol 247 (H) 0 - 200 mg/dL  ? Triglycerides 103.0 0.0 - 149.0 mg/dL  ? HDL 49.30 >39.00 mg/dL  ? VLDL 20.6 0.0 - 40.0 mg/dL  ? LDL Cholesterol 177 (H) 0 - 99 mg/dL  ? Total CHOL/HDL Ratio 5   ? NonHDL 197.67   ?TSH   ?Result Value Ref Range  ? TSH 2.11 0.35 - 5.50 uIU/mL  ?Vitamin B12  ?Result Value Ref Range  ? Vitamin B-12 610 211 - 911 pg/mL  ? ? ? ?

## 2021-05-24 NOTE — Patient Instructions (Addendum)
Good to see you again today- I will be in touch with your labs asap  ?Please schedule your mammogram - imaging might be able to do this for [place] today ? ?Assuming all is well please see me in about 6 months  ?

## 2021-05-27 ENCOUNTER — Ambulatory Visit (INDEPENDENT_AMBULATORY_CARE_PROVIDER_SITE_OTHER): Payer: Medicare Other | Admitting: Family Medicine

## 2021-05-27 ENCOUNTER — Encounter: Payer: Self-pay | Admitting: Family Medicine

## 2021-05-27 VITALS — BP 124/80 | HR 68 | Temp 97.8°F | Resp 18 | Ht 66.0 in | Wt 175.0 lb

## 2021-05-27 DIAGNOSIS — Z13220 Encounter for screening for lipoid disorders: Secondary | ICD-10-CM | POA: Diagnosis not present

## 2021-05-27 DIAGNOSIS — Z130 Encounter for screening for diseases of the blood and blood-forming organs and certain disorders involving the immune mechanism: Secondary | ICD-10-CM | POA: Diagnosis not present

## 2021-05-27 DIAGNOSIS — E538 Deficiency of other specified B group vitamins: Secondary | ICD-10-CM | POA: Diagnosis not present

## 2021-05-27 DIAGNOSIS — Z1329 Encounter for screening for other suspected endocrine disorder: Secondary | ICD-10-CM | POA: Diagnosis not present

## 2021-05-27 DIAGNOSIS — I10 Essential (primary) hypertension: Secondary | ICD-10-CM | POA: Diagnosis not present

## 2021-05-27 DIAGNOSIS — Z1231 Encounter for screening mammogram for malignant neoplasm of breast: Secondary | ICD-10-CM

## 2021-05-27 DIAGNOSIS — R7303 Prediabetes: Secondary | ICD-10-CM

## 2021-05-27 LAB — CBC
HCT: 40.9 % (ref 36.0–46.0)
Hemoglobin: 13.5 g/dL (ref 12.0–15.0)
MCHC: 33.1 g/dL (ref 30.0–36.0)
MCV: 90.2 fl (ref 78.0–100.0)
Platelets: 270 10*3/uL (ref 150.0–400.0)
RBC: 4.54 Mil/uL (ref 3.87–5.11)
RDW: 12.5 % (ref 11.5–15.5)
WBC: 8.3 10*3/uL (ref 4.0–10.5)

## 2021-05-27 LAB — LIPID PANEL
Cholesterol: 247 mg/dL — ABNORMAL HIGH (ref 0–200)
HDL: 49.3 mg/dL (ref >39.00)
LDL Cholesterol: 177 mg/dL — ABNORMAL HIGH (ref 0–99)
NonHDL: 197.67
Total CHOL/HDL Ratio: 5
Triglycerides: 103 mg/dL (ref 0.0–149.0)
VLDL: 20.6 mg/dL (ref 0.0–40.0)

## 2021-05-27 LAB — TSH: TSH: 2.11 u[IU]/mL (ref 0.35–5.50)

## 2021-05-27 LAB — VITAMIN B12: Vitamin B-12: 610 pg/mL (ref 211–911)

## 2021-05-27 LAB — HEMOGLOBIN A1C: Hgb A1c MFr Bld: 6.3 % (ref 4.6–6.5)

## 2021-06-24 ENCOUNTER — Other Ambulatory Visit: Payer: Self-pay

## 2021-06-24 DIAGNOSIS — K635 Polyp of colon: Secondary | ICD-10-CM | POA: Insufficient documentation

## 2021-06-24 DIAGNOSIS — M810 Age-related osteoporosis without current pathological fracture: Secondary | ICD-10-CM | POA: Insufficient documentation

## 2021-06-24 DIAGNOSIS — K5790 Diverticulosis of intestine, part unspecified, without perforation or abscess without bleeding: Secondary | ICD-10-CM | POA: Insufficient documentation

## 2021-06-24 DIAGNOSIS — D126 Benign neoplasm of colon, unspecified: Secondary | ICD-10-CM | POA: Insufficient documentation

## 2021-06-24 DIAGNOSIS — M1990 Unspecified osteoarthritis, unspecified site: Secondary | ICD-10-CM | POA: Insufficient documentation

## 2021-06-24 DIAGNOSIS — K644 Residual hemorrhoidal skin tags: Secondary | ICD-10-CM | POA: Insufficient documentation

## 2021-06-24 DIAGNOSIS — K648 Other hemorrhoids: Secondary | ICD-10-CM | POA: Insufficient documentation

## 2021-06-24 DIAGNOSIS — I10 Essential (primary) hypertension: Secondary | ICD-10-CM | POA: Insufficient documentation

## 2021-06-29 ENCOUNTER — Other Ambulatory Visit: Payer: Self-pay | Admitting: Family

## 2021-06-29 DIAGNOSIS — E559 Vitamin D deficiency, unspecified: Secondary | ICD-10-CM

## 2021-06-30 ENCOUNTER — Ambulatory Visit: Payer: Medicare Other | Admitting: Cardiology

## 2021-06-30 ENCOUNTER — Encounter: Payer: Self-pay | Admitting: Cardiology

## 2021-06-30 VITALS — BP 146/76 | HR 60 | Ht 66.0 in | Wt 189.0 lb

## 2021-06-30 DIAGNOSIS — E669 Obesity, unspecified: Secondary | ICD-10-CM

## 2021-06-30 DIAGNOSIS — I10 Essential (primary) hypertension: Secondary | ICD-10-CM

## 2021-06-30 DIAGNOSIS — R011 Cardiac murmur, unspecified: Secondary | ICD-10-CM

## 2021-06-30 DIAGNOSIS — E782 Mixed hyperlipidemia: Secondary | ICD-10-CM

## 2021-06-30 DIAGNOSIS — E66811 Obesity, class 1: Secondary | ICD-10-CM | POA: Insufficient documentation

## 2021-06-30 HISTORY — DX: Cardiac murmur, unspecified: R01.1

## 2021-06-30 HISTORY — DX: Obesity, unspecified: E66.9

## 2021-06-30 HISTORY — DX: Mixed hyperlipidemia: E78.2

## 2021-06-30 HISTORY — DX: Obesity, class 1: E66.811

## 2021-06-30 NOTE — Progress Notes (Signed)
?Cardiology Office Note:   ? ?Date:  06/30/2021  ? ?ID:  Maria [name], DOB 1951-01-09, MRN [id number] ? ?PCP:  [name], MD  ?Cardiologist:  Jenean Lindau, MD  ? ?Referring MD: [name], MD  ? ? ?ASSESSMENT:   ? ?1. Primary hypertension   ?2. Mixed dyslipidemia   ?3. Obesity (BMI 30.0-34.9)   ? ?PLAN:   ? ?In order of problems listed above: ? ?Primary prevention stressed with the patient.  Importance of compliance with diet medication stressed and she vocalized understanding.  She was advised to walk at least half an hour a day 5 days a week and she promises to do so. ?Essential hypertension: Blood pressure stable and diet was emphasized.  She has an element of whitecoat hypertension and she will bring a blood pressure log to [place] in 2 weeks for review. ?Mixed dyslipidemia: Markedly elevated lipids.  Diet was emphasized.  Exercise stressed.  She promises to do better.  For restratification I offered calcium scoring and she is agreeable. ?Cardiac murmur: Echocardiogram will be done to assess murmur heard on auscultation. ?Obesity: Weight reduction stressed risks of obesity explained and she promises to comply. ?Patient will be seen in follow-up appointment in 6 months or earlier if the patient has any concerns ? ? ? ?Medication Adjustments/Labs and Tests Ordered: ?Current medicines are reviewed at length with the patient today.  Concerns regarding medicines are outlined above.  ?No orders of the defined types were placed in this encounter. ? ?No orders of the defined types were placed in this encounter. ? ? ? ?History of Present Illness:   ? ?Maria [name] is a 71 y.o. female who is being seen today for the evaluation of elevated lipids at the request of Copland, Gay Filler, MD. patient is a pleasant 71 year old female.  She has past medical history of essential hypertension dyslipidemia and obesity.  She leads a sedentary lifestyle.  She is sent here for evaluation of mixed dyslipidemia.  She  mentions to me that she does not have any chest pain with activities of daily living.  No orthopnea or PND.  At the time of my evaluation, the patient is alert awake oriented and in no distress. ? ?Past Medical History:  ?Diagnosis Date  ? Arthritis   ? Colon polyps   ? Diverticulosis   ? Gastro-esophageal reflux disease with esophagitis 11/20/2014  ? Endoscopy 10/2014 per DR. Hung  ? GERD (gastroesophageal reflux disease)   ? Hemorrhoids, external without complications   ? Hemorrhoids, internal   ? [place] (hiatus hernia) 2014  ? HH (hiatus hernia) 2014  ? Hypertension   ? NONSPECIFIC ABN FINDING RAD & OTH EXAM GI TRACT 09/19/2009  ? Qualifier: Diagnosis of  By: Ardis Hughs MD, Melene Plan   ? Osteoporosis   ? pre  ? Prediabetes 10/13/2015  ? Reflux esophagitis   ? Tubular adenoma of colon   ? ? ?Past Surgical History:  ?Procedure Laterality Date  ? COLONOSCOPY    ? [id number]  ? ESOPHAGEAL MANOMETRY N/A 12/13/2014  ? Procedure: ESOPHAGEAL MANOMETRY (EM);  Surgeon: Carol Ada, MD;  Location: [facility];  Service: Endoscopy;  Laterality: N/A;  ? POLYPECTOMY    ? SPINE SURGERY    ? Neck  ? UPPER GASTROINTESTINAL ENDOSCOPY    ? ? ?Current Medications: ?Current Meds  ?Medication Sig  ? acetaminophen (TYLENOL) 325 MG tablet Take 650 mg by mouth every 6 (six) hours as needed for pain or headache.  ?  b complex vitamins tablet Take 1 tablet by mouth daily.  ? celecoxib (CELEBREX) 100 MG capsule Take 1 capsule (100 mg total) by mouth 2 (two) times daily. Use as needed for arthritis pain  ? Cholecalciferol (VITAMIN D3) 1.25 MG (50000 UT) CAPS Take 1 weekly for 12 weeks  ? Multiple Vitamin (MULTIVITAMIN) tablet Take 1 tablet by mouth daily.  ?  ? ?Allergies:   Patient has no known allergies.  ? ?Social History  ? ?Socioeconomic History  ? Marital status: Widowed  ?  Spouse name: Not on file  ? Number of children: 3  ? Years of education: 59  ? Highest education level: Not on file  ?Occupational History  ? Occupation: Retired  ?Tobacco Use  ?  Smoking status: Never  ? Smokeless tobacco: Never  ?Vaping Use  ? Vaping Use: Never used  ?Substance and Sexual Activity  ? Alcohol use: No  ?  Alcohol/week: 0.0 standard drinks  ? Drug use: No  ? Sexual activity: Never  ?Other Topics Concern  ? Not on file  ?Social History Narrative  ? Not on file  ? ?Social Determinants of Health  ? ?Financial Resource Strain: Not on file  ?Food Insecurity: Not on file  ?Transportation Needs: Not on file  ?Physical Activity: Not on file  ?Stress: Not on file  ?Social Connections: Not on file  ?  ? ?Family History: ?The patient's family history includes Arthritis in her brother; Hypertension in her father. There is no history of Colon cancer, Rectal cancer, Esophageal cancer, Stomach cancer, Pancreatic cancer, Prostate cancer, or Colon polyps. ? ?ROS:   ?Please see the history of present illness.    ?All other systems reviewed and are negative. ? ?EKGs/Labs/Other Studies Reviewed:   ? ?The following studies were reviewed today: ?EKG reveals sinus rhythm and nonspecific ST-T changes ? ? ?Recent Labs: ?03/26/2021: ALT 20; BUN 18; Creatinine, Ser 0.92; Potassium 4.5; Sodium 138 ?05/27/2021: Hemoglobin 13.5; Platelets 270.0; TSH 2.11  ?Recent Lipid Panel ?   ?Component Value Date/Time  ? CHOL 247 (H) 05/27/2021 1049  ? TRIG 103.0 05/27/2021 1049  ? HDL 49.30 05/27/2021 1049  ? CHOLHDL 5 05/27/2021 1049  ? VLDL 20.6 05/27/2021 1049  ? LDLCALC 177 (H) 05/27/2021 1049  ? ? ?Physical Exam:   ? ?VS:  BP (!) 146/76   Pulse 60   Ht '5\' 6"'$  (1.676 m)   Wt 189 lb (85.7 kg)   SpO2 97%   BMI 30.51 kg/m?    ? ?Wt Readings from Last 3 Encounters:  ?06/30/21 189 lb (85.7 kg)  ?05/27/21 175 lb (79.4 kg)  ?03/26/21 191 lb 9.6 oz (86.9 kg)  ?  ? ?GEN: Patient is in no acute distress ?HEENT: Normal ?NECK: No JVD; No carotid bruits ?LYMPHATICS: No lymphadenopathy ?CARDIAC: S1 S2 regular, 2/6 systolic murmur at the apex. ?RESPIRATORY:  Clear to auscultation without rales, wheezing or rhonchi  ?ABDOMEN:  Soft, non-tender, non-distended ?MUSCULOSKELETAL:  No edema; No deformity  ?SKIN: Warm and dry ?NEUROLOGIC:  Alert and oriented x 3 ?PSYCHIATRIC:  Normal affect  ? ? ?Signed, ?Jenean Lindau, MD  ?06/30/2021 10:04 AM    ?[place]   ?

## 2021-06-30 NOTE — Patient Instructions (Addendum)
Keep a log for 2 weeks ?Blood Pressure Record Sheet ?To take your blood pressure, you will need a blood pressure machine. You can buy a blood pressure machine (blood pressure monitor) at your clinic, drug store, or online. When choosing one, consider: ?An automatic monitor that has an arm cuff. ?A cuff that wraps snugly around your upper arm. You should be able to fit only one finger between your arm and the cuff. ?A device that stores blood pressure reading results. ?Do not choose a monitor that measures your blood pressure from your wrist or finger. ?Follow your health care provider's instructions for how to take your blood pressure. To use this form: ?Get one reading in the morning (a.m.) 1-2 hours after you take any medicines. ?Get one reading in the evening (p.m.) before supper. ?Take at least 2 readings with each blood pressure check. This makes sure the results are correct. Wait 1-2 minutes between measurements. ?Write down the results in the spaces on this form. ?Repeat this once a week, or as told by your health care provider. ? ?Make a follow-up appointment with your health care provider to discuss the results. ?Blood pressure log ?Date: _______________________ ?a.m. _____________________(1st reading) HR___________ ? ?          p.m. _____________________(2nd reading) HR__________ ? ?Date: _______________________ ?a.m. _____________________(1st reading) HR___________ ? ?          p.m. _____________________(2nd reading) HR__________ ?Date: _______________________ ?a.m. _____________________(1st reading) HR___________ ? ?          p.m. _____________________(2nd reading) HR__________ ?Date: _______________________ ?a.m. _____________________(1st reading) HR___________ ? ?          p.m. _____________________(2nd reading) HR__________ ? ?Date: _______________________ ?a.m. _____________________(1st reading) HR___________ ? ?          p.m. _____________________(2nd reading) HR__________ ? ?Date:  _______________________ ?a.m. _____________________(1st reading) HR___________ ? ?          p.m. _____________________(2nd reading) HR__________ ? ?Date: _______________________ ?a.m. _____________________(1st reading) HR___________ ? ?          p.m. _____________________(2nd reading) HR__________ ? ? ?This information is not intended to replace advice given to you by your health care provider. Make sure you discuss any questions you have with your health care provider. ?Document Revised: 06/27/2019 Document Reviewed: 06/27/2019 ?Elsevier Patient Education ? [place]. ? ? ?Medication Instructions:  ?Your physician recommends that you continue on your current medications as directed. Please refer to the Current Medication list given to you today. ? ?*If you need a refill on your cardiac medications before your next appointment, please call your pharmacy* ? ? ?Lab Work: ?None ordered ?If you have labs (blood work) drawn today and your tests are completely normal, you will receive your results only by: ?MyChart Message (if you have MyChart) OR ?A paper copy in the mail ?If you have any lab test that is abnormal or we need to change your treatment, we will call you to review the results. ? ? ?Testing/Procedures: ?Your physician has requested that you have an echocardiogram. Echocardiography is a painless test that uses sound waves to create images of your heart. It provides your doctor with information about the size and shape of your heart and how well your heart?s chambers and valves are working. This procedure takes approximately one hour. There are no restrictions for this procedure. ? ?We will order CT coronary calcium score. ?It will cost $99.00 and is not covered by insurance.  ?Please call to schedule.   ? ?[facility]  [address]  ?[place], [place] [address] ?[phone] ?           Or ?[facility] ?[address], [place] [address] ?[phone]  [phone] ? ? ?Follow-Up: ?At [facility], you and your health needs are our priority.  As part of our continuing mission to provide you with exceptional heart care, we have created designated Provider Care Teams.  These Care Teams include your primary Cardiologist (physician) and Advanced Practice Providers (APPs -  Physician Assistants and Nurse Practitioners) who all work together to provide you with the care you need, when you need it. ? ?We recommend signing up for the patient portal called "MyChart".  Sign up information is provided on this After Visit Summary.  MyChart is used to connect with patients for Virtual Visits (Telemedicine).  Patients are able to view lab/test results, encounter notes, upcoming appointments, etc.  Non-urgent messages can be sent to your provider as well.   ?To learn more about what you can do with MyChart, go to [email or web address].   ? ?Your next appointment:   ?6 month(s) ? ?The format for your next appointment:   ?In Person ? ?Provider:   ?Jyl Heinz, MD ? ? ?Other Instructions ?Echocardiogram ?An echocardiogram is a test that uses sound waves (ultrasound) to produce images of the heart. ?Images from an echocardiogram can provide important information about: ?Heart size and shape. ?The size and thickness and movement of your heart's walls. ?Heart muscle function and strength. ?Heart valve function or if you have stenosis. Stenosis is when the heart valves are too narrow. ?If blood is flowing backward through the heart valves (regurgitation). ?A tumor or infectious growth around the heart valves. ?Areas of heart muscle that are not working well because of poor blood flow or injury from a heart attack. ?Aneurysm detection. An aneurysm is a weak or damaged part of an artery wall. The wall bulges out from the normal force of blood pumping through the body. ?Tell a health care provider about: ?Any allergies you have. ?All medicines you are taking, including vitamins, herbs,  eye drops, creams, and over-the-counter medicines. ?Any blood disorders you have. ?Any surgeries you have had. ?Any medical conditions you have. ?Whether you are pregnant or may be pregnant. ?What are the risks? ?Generally, this is a safe test. However, problems may occur, including an allergic reaction to dye (contrast) that may be used during the test. ?What happens before the test? ?No specific preparation is needed. You may eat and drink normally. ?What happens during the test? ?You will take off your clothes from the waist up and put on a hospital gown. ?Electrodes or electrocardiogram (ECG)patches may be placed on your chest. The electrodes or patches are then connected to a device that monitors your heart rate and rhythm. ?You will lie down on a table for an ultrasound exam. A gel will be applied to your chest to help sound waves pass through your skin. ?A handheld device, called a transducer, will be pressed against your chest and moved over your heart. The transducer produces sound waves that travel to your heart and bounce back (or "echo" back) to the transducer. These sound waves will be captured in real-time and changed into images of your heart that can be viewed on a video monitor. The images will be recorded on a computer and reviewed by your health care provider. ?You may be asked to change positions or hold your breath for a short time. This  makes it easier to get different views or better views of your heart. ?In some cases, you may receive contrast through an IV in one of your veins. This can improve the quality of the pictures from your heart. ?The procedure may vary among health care providers and hospitals.   ?What can I expect after the test? ?You may return to your normal, everyday life, including diet, activities, and medicines, unless your health care provider tells you not to do that. ?Follow these instructions at home: ?It is up to you to get the results of your test. Ask your health care  provider, or the department that is doing the test, when your results will be ready. ?Keep all follow-up visits. This is important. ?Summary ?An echocardiogram is a test that uses sound waves (ultrasound) to produce images of the heart. ?I

## 2021-07-02 NOTE — Telephone Encounter (Signed)
Called patient to let her know she needs a Vitamin D check. She reports she has "a lot of appointments at this time and she will call to schedule lab appointment later on" orders placed as future.  ?

## 2021-07-02 NOTE — Addendum Note (Signed)
Addended by: Jiles Prows on: 07/02/2021 12:06 PM ? ? Modules accepted: Orders ? ?

## 2021-07-14 ENCOUNTER — Ambulatory Visit (HOSPITAL_BASED_OUTPATIENT_CLINIC_OR_DEPARTMENT_OTHER)
Admission: RE | Admit: 2021-07-14 | Discharge: 2021-07-14 | Disposition: A | Discharge status: Home / Self Care | Payer: Medicare Other | Source: Ambulatory Visit | Attending: Cardiology | Admitting: Cardiology

## 2021-07-14 ENCOUNTER — Telehealth: Payer: Self-pay

## 2021-07-14 DIAGNOSIS — R011 Cardiac murmur, unspecified: Secondary | ICD-10-CM | POA: Diagnosis not present

## 2021-07-14 LAB — ECHOCARDIOGRAM COMPLETE
AR max vel: 2.23 cm2
AV Area VTI: 2.29 cm2
AV Area mean vel: 2.35 cm2
AV Mean grad: 4 mmHg
AV Peak grad: 7.5 mmHg
Ao pk vel: 1.37 m/s
Area-P 1/2: 2.63 cm2
S' Lateral: 1.9 cm

## 2021-07-14 NOTE — Telephone Encounter (Signed)
Patient notified of results.

## 2021-07-14 NOTE — Progress Notes (Signed)
?  Echocardiogram ?2D Echocardiogram has been performed. ? ?Maria [name] ?07/14/2021, 11:19 AM ?

## 2021-07-14 NOTE — Telephone Encounter (Signed)
-----   Message from Jenean Lindau, MD sent at 07/14/2021  3:42 PM EDT ----- ?The results of the study is unremarkable. Please inform patient. I will discuss in detail at next appointment. Cc  primary care/referring physician ?Jenean Lindau, MD 07/14/2021 3:42 PM  ?

## 2021-07-20 ENCOUNTER — Inpatient Hospital Stay (HOSPITAL_BASED_OUTPATIENT_CLINIC_OR_DEPARTMENT_OTHER): Admission: RE | Admit: 2021-07-20 | Payer: Medicare Other | Source: Ambulatory Visit

## 2021-07-21 ENCOUNTER — Ambulatory Visit (HOSPITAL_BASED_OUTPATIENT_CLINIC_OR_DEPARTMENT_OTHER): Payer: Medicare Other

## 2021-07-27 ENCOUNTER — Ambulatory Visit (HOSPITAL_BASED_OUTPATIENT_CLINIC_OR_DEPARTMENT_OTHER): Payer: Medicare Other

## 2021-07-28 ENCOUNTER — Encounter (HOSPITAL_BASED_OUTPATIENT_CLINIC_OR_DEPARTMENT_OTHER): Payer: Self-pay

## 2021-07-28 ENCOUNTER — Ambulatory Visit (HOSPITAL_BASED_OUTPATIENT_CLINIC_OR_DEPARTMENT_OTHER)
Admission: RE | Admit: 2021-07-28 | Discharge: 2021-07-28 | Disposition: A | Discharge status: Home / Self Care | Payer: Medicare Other | Source: Ambulatory Visit | Attending: Family Medicine | Admitting: Family Medicine

## 2021-07-28 DIAGNOSIS — Z1231 Encounter for screening mammogram for malignant neoplasm of breast: Secondary | ICD-10-CM | POA: Insufficient documentation

## 2021-09-09 LAB — HEMOGLOBIN A1C: Hemoglobin A1C: 6.2

## 2021-09-30 ENCOUNTER — Other Ambulatory Visit: Payer: Self-pay

## 2021-10-06 ENCOUNTER — Telehealth: Payer: Self-pay

## 2021-10-06 ENCOUNTER — Ambulatory Visit (INDEPENDENT_AMBULATORY_CARE_PROVIDER_SITE_OTHER): Payer: Medicare Other

## 2021-10-06 DIAGNOSIS — Z789 Other specified health status: Secondary | ICD-10-CM

## 2021-10-06 DIAGNOSIS — Z0000 Encounter for general adult medical examination without abnormal findings: Secondary | ICD-10-CM

## 2021-10-06 NOTE — Telephone Encounter (Signed)
   Telephone encounter was:  Unsuccessful.  10/06/2021 Name: Maria [name] MRN: [id number] DOB: [birth year]  Unsuccessful outbound call made today to assist with:   Medical equipment  Outreach Attempt:  1st Attempt  A HIPAA compliant voice message was left requesting a return call.  Instructed patient to call back at [phone] at their earliest convenience.  [facility] management  [place], [place]  Main Phone: [phone]  E-mail: [email or web address]  Website: [email or web address]

## 2021-10-06 NOTE — Progress Notes (Addendum)
Virtual Visit via Telephone Note  I connected with  Maria [name] on 10/06/21 at  2:30 PM EDT by telephone and verified that I am speaking with the correct person using two identifiers.  Medicare Annual Wellness visit completed telephonically due to Covid-19 pandemic.   Persons participating in this call: This Health Coach and this patient.   Location: Patient: home Provider: office   I discussed the limitations, risks, security and privacy concerns of performing an evaluation and management service by telephone and the availability of in person appointments. The patient expressed understanding and agreed to proceed.  Unable to perform video visit due to video visit attempted and failed and/or patient does not have video capability.   Some vital signs may be absent or patient reported.   Willette Brace, LPN   Subjective:   Maria [name] is a 71 y.o. female who presents for Medicare Annual (Subsequent) preventive examination.  Review of Systems     Cardiac Risk Factors include: advanced age (>54mn, >>55women);hypertension;dyslipidemia;obesity (BMI >30kg/m2)     Objective:    There were no vitals filed for this visit. There is no height or weight on file to calculate BMI.     10/06/2021    2:42 PM 04/13/2019   11:31 AM 11/14/2016    9:47 AM 10/01/2016    4:57 PM 05/24/2016   11:19 AM  Advanced Directives  Does Patient Have a Medical Advance Directive? No Yes No No Yes  Type of Advance Directive  Living [place]   [place]  Does patient want to make changes to medical advance directive?  No - Patient declined     Copy of [place] Chart?     No - copy requested  Would patient like information on creating a medical advance directive? No - Patient declined   No - Patient declined     Current Medications (verified) Outpatient Encounter Medications as of 10/06/2021  Medication Sig   acetaminophen (TYLENOL) 325 MG tablet Take  650 mg by mouth every 6 (six) hours as needed for pain or headache.   b complex vitamins tablet Take 1 tablet by mouth daily.   celecoxib (CELEBREX) 100 MG capsule Take 1 capsule (100 mg total) by mouth 2 (two) times daily. Use as needed for arthritis pain   Cholecalciferol (VITAMIN D3) 1.25 MG (50000 UT) CAPS Take 1 weekly for 12 weeks   Multiple Vitamin (MULTIVITAMIN) tablet Take 1 tablet by mouth daily.   No facility-administered encounter medications on file as of 10/06/2021.    Allergies (verified) Patient has no known allergies.   History: Past Medical History:  Diagnosis Date   Arthritis    Colon polyps    Diverticulosis    Gastro-esophageal reflux disease with esophagitis 11/20/2014   Endoscopy 10/2014 per DR. Hung   GERD (gastroesophageal reflux disease)    Hemorrhoids, external without complications    Hemorrhoids, internal    HH (hiatus hernia) 2014   [place]   Hypertension    NONSPECIFIC ABN FINDING RAD & OTH EXAM GI TRACT 09/19/2009   Qualifier: Diagnosis of  By: JArdis HughsMD, DMelene Plan   Osteoporosis    pre   Prediabetes 10/13/2015   Reflux esophagitis    Tubular adenoma of colon    Past Surgical History:  Procedure Laterality Date   COLONOSCOPY     [id number]  ESOPHAGEAL MANOMETRY N/A 12/13/2014   Procedure: ESOPHAGEAL MANOMETRY (EM);  Surgeon:  Carol Ada, MD;  Location: [facility];  Service: Endoscopy;  Laterality: N/A;   POLYPECTOMY     SPINE SURGERY     Neck   UPPER GASTROINTESTINAL ENDOSCOPY     Family History  Problem Relation Age of Onset   Hypertension Father    Arthritis Brother    Colon cancer Neg Hx    Rectal cancer Neg Hx    Esophageal cancer Neg Hx    Stomach cancer Neg Hx    Pancreatic cancer Neg Hx    Prostate cancer Neg Hx    Colon polyps Neg Hx    Social History   Socioeconomic History   Marital status: Widowed    Spouse name: Not on file   Number of children: 3   Years of education: 52   Highest education level:  Not on file  Occupational History   Occupation: Retired  Tobacco Use   Smoking status: Never   Smokeless tobacco: Never  Vaping Use   Vaping Use: Never used  Substance and Sexual Activity   Alcohol use: No    Alcohol/week: 0.0 standard drinks of alcohol   Drug use: No   Sexual activity: Never  Other Topics Concern   Not on file  Social History Narrative   Not on file   Social Determinants of Health   Financial Resource Strain: Low Risk  (10/06/2021)   Overall Financial Resource Strain (CARDIA)    Difficulty of Paying Living Expenses: Not hard at all  Food Insecurity: No Food Insecurity (10/06/2021)   Hunger Vital Sign    Worried About Running Out of Food in the Last Year: Never true    [place] in the Last Year: Never true  Transportation Needs: No Transportation Needs (10/06/2021)   PRAPARE - Hydrologist (Medical): No    Lack of Transportation (Non-Medical): No  Physical Activity: Inactive (10/06/2021)   Exercise Vital Sign    Days of Exercise per Week: 0 days    Minutes of Exercise per Session: 0 min  Stress: No Stress Concern Present (10/06/2021)   [place]    Feeling of Stress : Not at all  Social Connections: Moderately Isolated (10/06/2021)   Social Connection and Isolation Panel [NHANES]    Frequency of Communication with Friends and Family: More than three times a week    Frequency of Social Gatherings with Friends and Family: More than three times a week    Attends Religious Services: More than 4 times per year    Active Member of [employer] or Organizations: No    Attends Archivist Meetings: Never    Marital Status: Widowed    Tobacco Counseling Counseling given: Not Answered   Clinical Intake:  Pre-visit preparation completed: Yes  Pain : No/denies pain     Nutritional Risks: None Diabetes: No  How often do you need to have someone help you  when you read instructions, pamphlets, or other written materials from your doctor or pharmacy?: 1 - Never  Diabetic?no  Interpreter Needed?: No  Information entered by :: Charlott Rakes, LPN   Activities of Daily Living    10/06/2021    2:46 PM  In your present state of health, do you have any difficulty performing the following activities:  Hearing? 0  Vision? 0  Difficulty concentrating or making decisions? 0  Walking or climbing stairs? 0  Dressing or bathing? 0  Doing errands, shopping? 0  Preparing Food and eating ? N  Using the Toilet? N  In the past six months, have you accidently leaked urine? N  Do you have problems with loss of bowel control? N  Managing your Medications? N  Managing your Finances? N  Housekeeping or managing your Housekeeping? N    Patient Care Team: Copland, Gay Filler, MD as PCP - General (Family Medicine) Linda Hedges, DO as Consulting Physician (Obstetrics and Gynecology)  Indicate any recent Medical Services you may have received from other than [facility] providers in the past year (date may be approximate).     Assessment:   This is a routine wellness examination for Diondra.  Hearing/Vision screen Hearing Screening - Comments:: Pt denies any hearing issues  Vision Screening - Comments:: Pt follows up with [facility]   Dietary issues and exercise activities discussed: Current Exercise Habits: The patient does not participate in regular exercise at present   Goals Addressed             This Visit's Progress    Patient Stated       Lose 20lbs        Depression Screen    10/06/2021    2:40 PM 05/27/2021   10:33 AM 03/26/2021    1:15 PM 07/02/2020   10:50 AM 04/13/2019   11:40 AM 05/10/2018    1:14 PM 05/24/2016   11:20 AM  PHQ 2/9 Scores  PHQ - 2 Score 0 0 0 0 0 0 0    Fall Risk    10/06/2021    2:46 PM 05/27/2021   10:33 AM 03/26/2021    1:14 PM 07/02/2020   10:50 AM 04/13/2019   11:40 AM  Fall Risk   Falls in the past year?  0 1 1 0 0  Number falls in past yr: 0 1 0 0 0  Injury with Fall? 0 0 0 0 0  Risk for fall due to : Impaired vision  Impaired balance/gait    Follow up Falls prevention discussed  Falls evaluation completed  Education provided;Falls prevention discussed    FALL RISK PREVENTION PERTAINING TO THE HOME:  Any stairs in or around the home? No  If so, are there any without handrails? No  Home free of loose throw rugs in walkways, pet beds, electrical cords, etc? Yes  Adequate lighting in your home to reduce risk of falls? Yes   ASSISTIVE DEVICES UTILIZED TO PREVENT FALLS:  Life alert? No  Use of a cane, walker or w/c? No  Grab bars in the bathroom? Yes  Shower chair or bench in shower? No  Elevated toilet seat or a handicapped toilet? No   TIMED UP AND GO:  Was the test performed? No .   Cognitive Function:    05/24/2016   11:20 AM  MMSE - Mini Mental State Exam  Orientation to time 5  Orientation to Place 5  Registration 3  Attention/ Calculation 5  Recall 2  Language- name 2 objects 2  Language- repeat 1  Language- follow 3 step command 3  Language- read & follow direction 1  Write a sentence 1  Copy design 1  Total score 29        10/06/2021    2:47 PM  6CIT Screen  What Year? 0 points  What month? 0 points  What time? 0 points  Count back from 20 0 points  Months in reverse 0 points  Repeat phrase 0 points  Total Score 0 points  Immunizations Immunization History  Administered Date(s) Administered   Fluad Quad(high Dose 65+) 11/27/2018   Influenza Split 01/17/2013, 12/21/2014   Influenza, High Dose Seasonal PF 12/17/2015, 01/08/2017   Influenza,inj,Quad PF,6+ Mos 12/21/2018   Influenza,inj,quad, With Preservative 12/31/2019   Influenza-Unspecified 12/31/2017   PFIZER(Purple Top)SARS-COV-2 Vaccination 07/20/2019, 08/13/2019, 02/02/2020   Pfizer Covid-19 Vaccine Bivalent Booster 15yr & up 12/03/2020   Pneumococcal Conjugate-13 02/05/2015, 01/31/2016,  01/24/2020   Pneumococcal Polysaccharide-23 01/31/2017   Td 02/05/2015   Zoster Recombinat (Shingrix) 12/31/2017    TDAP status: Up to date  Flu Vaccine status: Due, Education has been provided regarding the importance of this vaccine. Advised may receive this vaccine at local pharmacy or Health Dept. Aware to provide a copy of the vaccination record if obtained from local pharmacy or Health Dept. Verbalized acceptance and understanding.  Pneumococcal vaccine status: Up to date  Covid-19 vaccine status: Completed vaccines  Qualifies for Shingles Vaccine? Yes   Zostavax completed Yes   Shingrix Completed?: Yes  Screening Tests Health Maintenance  Topic Date Due   Zoster Vaccines- Shingrix (2 of 2) 02/25/2018   COVID-19 Vaccine (5 - Pfizer series) 04/04/2021   INFLUENZA VACCINE  10/20/2021   MAMMOGRAM  07/29/2023   COLONOSCOPY (Pts 45-458yrInsurance coverage will need to be confirmed)  12/06/2024   TETANUS/TDAP  02/04/2025   Pneumonia Vaccine 6564Years old  Completed   DEXA SCAN  Completed   Hepatitis C Screening  Completed   HPV VACCINES  Aged Out    Health Maintenance  Health Maintenance Due  Topic Date Due   Zoster Vaccines- Shingrix (2 of 2) 02/25/2018   COVID-19 Vaccine (5 - Pfizer series) 04/04/2021    Colorectal cancer screening: Type of screening: Colonoscopy. Completed 12/07/19. Repeat every 5 years  Mammogram status: Completed 07/28/21. Repeat every year  Bone Density status: Completed 04/28/16. Results reflect: Bone density results: OSTEOPOROSIS. Repeat every 2 years.  Additional Screening:  Hepatitis C Screening: Completed 02/05/15  Vision Screening: Recommended annual ophthalmology exams for early detection of glaucoma and other disorders of the eye. Is the patient up to date with their annual eye exam?  Yes  Who is the provider or what is the name of the office in which the patient attends annual eye exams? [facility] If pt is not established with a  provider, would they like to be referred to a provider to establish care? No .   Dental Screening: Recommended annual dental exams for proper oral hygiene  Community Resource Referral / Chronic Care Management: CRR required this visit?  No   CCM required this visit?  No      Plan:     I have personally reviewed and noted the following in the patient's chart:   Medical and social history Use of alcohol, tobacco or illicit drugs  Current medications and supplements including opioid prescriptions.  Functional ability and status Nutritional status Physical activity Advanced directives List of other physicians Hospitalizations, surgeries, and ER visits in previous 12 months Vitals Screenings to include cognitive, depression, and falls Referrals and appointments  In addition, I have reviewed and discussed with patient certain preventive protocols, quality metrics, and best practice recommendations. A written personalized care plan for preventive services as well as general preventive health recommendations were provided to patient.     TiWillette BraceLPN   09/20/86/4166 Nurse Notes: None   I have reviewed and agree with Health Coaches documentation.  JoKathlene NovemberMD

## 2021-10-06 NOTE — Patient Instructions (Addendum)
[name] , Thank you for taking time to come for your Medicare Wellness Visit. I appreciate your ongoing commitment to your health goals. Please review the following plan we discussed and let me know if I can assist you in the future.   Screening recommendations/referrals: Colonoscopy: done 12/07/19 report every 5 years  Mammogram: done 07/28/21 repeat every year  Bone Density: done 04/28/16 repeat every 2 year s Recommended yearly ophthalmology/optometry visit for glaucoma screening and checkup Recommended yearly dental visit for hygiene and checkup  Vaccinations: Influenza vaccine: pt stated completed  Pneumococcal vaccine: Up to date Tdap vaccine: done 02/05/15 repeat every 10 years  Shingles vaccine: 12/31/17   Covid-19:completed 4/30, 5/24, 02/02/20  Advanced directives: Advance directive discussed with you today. Even though you declined this today please call our office should you change your mind and we can give you the proper paperwork for you to fill out.  Conditions/risks identified: lose 20 lbs   Next appointment: Follow up in one year for your annual wellness visit    Preventive Care 65 Years and Older, Female Preventive care refers to lifestyle choices and visits with your health care provider that can promote health and wellness. What does preventive care include? A yearly physical exam. This is also called an annual well check. Dental exams once or twice a year. Routine eye exams. Ask your health care provider how often you should have your eyes checked. Personal lifestyle choices, including: Daily care of your teeth and gums. Regular physical activity. Eating a healthy diet. Avoiding tobacco and drug use. Limiting alcohol use. Practicing safe sex. Taking low-dose aspirin every day. Taking vitamin and mineral supplements as recommended by your health care provider. What happens during an annual well check? The services and screenings done by your health care  provider during your annual well check will depend on your age, overall health, lifestyle risk factors, and family history of disease. Counseling  Your health care provider may ask you questions about your: Alcohol use. Tobacco use. Drug use. Emotional well-being. Home and relationship well-being. Sexual activity. Eating habits. History of falls. Memory and ability to understand (cognition). Work and work Statistician. Reproductive health. Screening  You may have the following tests or measurements: Height, weight, and BMI. Blood pressure. Lipid and cholesterol levels. These may be checked every 5 years, or more frequently if you are over 72 years old. Skin check. Lung cancer screening. You may have this screening every year starting at age 72 if you have a 30-pack-year history of smoking and currently smoke or have quit within the past 15 years. Fecal occult blood test (FOBT) of the stool. You may have this test every year starting at age 72. Flexible sigmoidoscopy or colonoscopy. You may have a sigmoidoscopy every 5 years or a colonoscopy every 10 years starting at age 72. Hepatitis C blood test. Hepatitis B blood test. Sexually transmitted disease (STD) testing. Diabetes screening. This is done by checking your blood sugar (glucose) after you have not eaten for a while (fasting). You may have this done every 1-3 years. Bone density scan. This is done to screen for osteoporosis. You may have this done starting at age 72. Mammogram. This may be done every 1-2 years. Talk to your health care provider about how often you should have regular mammograms. Talk with your health care provider about your test results, treatment options, and if necessary, the need for more tests. Vaccines  Your health care provider may recommend certain vaccines, such as: Influenza  vaccine. This is recommended every year. Tetanus, diphtheria, and acellular pertussis (Tdap, Td) vaccine. You may need a Td  booster every 10 years. Zoster vaccine. You may need this after age 55. Pneumococcal 13-valent conjugate (PCV13) vaccine. One dose is recommended after age 72. Pneumococcal polysaccharide (PPSV23) vaccine. One dose is recommended after age 72. Talk to your health care provider about which screenings and vaccines you need and how often you need them. This information is not intended to replace advice given to you by your health care provider. Make sure you discuss any questions you have with your health care provider. Document Released: 04/04/2015 Document Revised: 11/26/2015 Document Reviewed: 01/07/2015 Elsevier Interactive Patient Education  2017 [place] Prevention in the Home Falls can cause injuries. They can happen to people of all ages. There are many things you can do to make your home safe and to help prevent falls. What can I do on the outside of my home? Regularly fix the edges of walkways and driveways and fix any cracks. Remove anything that might make you trip as you walk through a door, such as a raised step or threshold. Trim any bushes or trees on the path to your home. Use bright outdoor lighting. Clear any walking paths of anything that might make someone trip, such as rocks or tools. Regularly check to see if handrails are loose or broken. Make sure that both sides of any steps have handrails. Any raised decks and porches should have guardrails on the edges. Have any leaves, snow, or ice cleared regularly. Use sand or salt on walking paths during winter. Clean up any spills in your garage right away. This includes oil or grease spills. What can I do in the bathroom? Use night lights. Install grab bars by the toilet and in the tub and shower. Do not use towel bars as grab bars. Use non-skid mats or decals in the tub or shower. If you need to sit down in the shower, use a plastic, non-slip stool. Keep the floor dry. Clean up any water that spills on the floor  as soon as it happens. Remove soap buildup in the tub or shower regularly. Attach bath mats securely with double-sided non-slip rug tape. Do not have throw rugs and other things on the floor that can make you trip. What can I do in the bedroom? Use night lights. Make sure that you have a light by your bed that is easy to reach. Do not use any sheets or blankets that are too big for your bed. They should not hang down onto the floor. Have a firm chair that has side arms. You can use this for support while you get dressed. Do not have throw rugs and other things on the floor that can make you trip. What can I do in the kitchen? Clean up any spills right away. Avoid walking on wet floors. Keep items that you use a lot in easy-to-reach places. If you need to reach something above you, use a strong step stool that has a grab bar. Keep electrical cords out of the way. Do not use floor polish or wax that makes floors slippery. If you must use wax, use non-skid floor wax. Do not have throw rugs and other things on the floor that can make you trip. What can I do with my stairs? Do not leave any items on the stairs. Make sure that there are handrails on both sides of the stairs and use them. Fix  handrails that are broken or loose. Make sure that handrails are as long as the stairways. Check any carpeting to make sure that it is firmly attached to the stairs. Fix any carpet that is loose or worn. Avoid having throw rugs at the top or bottom of the stairs. If you do have throw rugs, attach them to the floor with carpet tape. Make sure that you have a light switch at the top of the stairs and the bottom of the stairs. If you do not have them, ask someone to add them for you. What else can I do to help prevent falls? Wear shoes that: Do not have high heels. Have rubber bottoms. Are comfortable and fit you well. Are closed at the toe. Do not wear sandals. If you use a stepladder: Make sure that it is  fully opened. Do not climb a closed stepladder. Make sure that both sides of the stepladder are locked into place. Ask someone to hold it for you, if possible. Clearly mark and make sure that you can see: Any grab bars or handrails. First and last steps. Where the edge of each step is. Use tools that help you move around (mobility aids) if they are needed. These include: Canes. Walkers. Scooters. Crutches. Turn on the lights when you go into a dark area. Replace any light bulbs as soon as they burn out. Set up your furniture so you have a clear path. Avoid moving your furniture around. If any of your floors are uneven, fix them. If there are any pets around you, be aware of where they are. Review your medicines with your doctor. Some medicines can make you feel dizzy. This can increase your chance of falling. Ask your doctor what other things that you can do to help prevent falls. This information is not intended to replace advice given to you by your health care provider. Make sure you discuss any questions you have with your health care provider. Document Released: 01/02/2009 Document Revised: 08/14/2015 Document Reviewed: 04/12/2014 Elsevier Interactive Patient Education  2017 [employer].

## 2021-10-07 ENCOUNTER — Telehealth: Payer: Self-pay

## 2021-10-07 NOTE — Telephone Encounter (Signed)
   Telephone encounter was:  Successful.  10/07/2021 Name: AMAYRANY CAFARO MRN: [id number] DOB: Jun 03, 1950  AGNES BRIGHTBILL is a 71 y.o. year old female who is a primary care patient of Copland, Gay Filler, MD . The community resource team was consulted for assistance with  Medical equipment  Care guide performed the following interventions: Patient provided with information about care guide support team and interviewed to confirm resource needs. Patient advised she is needing Life Alert information and pricing.  Follow Up Plan:  Care guide will outreach resources to assist patient with life alert products.  [place] management  [place], [place]  Main Phone: [phone]  E-mail: [email or web address]  Website: [email or web address]

## 2021-10-19 ENCOUNTER — Telehealth: Payer: Self-pay

## 2021-10-19 NOTE — Telephone Encounter (Signed)
   Telephone encounter was:  Unsuccessfu Mail encounter was:  Successful.  10/19/2021 Name: [name] MRN: [id number] DOB: 1950-10-08  Maria [name] is a 71 y.o. year old female who is a primary care patient of Copland, Gay Filler, MD . The community resource team was consulted for assistance with  Medical Equipment/Life Alert  Care guide performed the following interventions: Follow up call placed to the patient to discuss status of referral. Patient works during these hours, however, an informed voicemail was left advising e-mail has been sent per her request. CG sent: -[place] Program information.  Patient has also been informed [employer] has sent her medical device information as well as they have a medical bracelet program as well under her plan.  Follow Up Plan:  No further follow up planned at this time. The patient has been provided with needed resources.  [place] management  [place], [place]  Main Phone: [phone]  E-mail: [email or web address]  Website: [email or web address]

## 2021-11-05 ENCOUNTER — Telehealth: Payer: Self-pay | Admitting: Family Medicine

## 2021-11-05 ENCOUNTER — Encounter: Payer: Self-pay | Admitting: Family Medicine

## 2021-11-05 NOTE — Telephone Encounter (Signed)
Pt called stating that she would like to have a letter written for her dictating her conditions and what limitations she has to be excused from certain duties with her job. 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

## 2021-11-11 ENCOUNTER — Ambulatory Visit (INDEPENDENT_AMBULATORY_CARE_PROVIDER_SITE_OTHER): Payer: Medicare Other | Admitting: Family Medicine

## 2021-11-11 VITALS — BP 118/73 | HR 78 | Temp 97.6°F | Resp 18 | Ht 66.0 in | Wt 186.4 lb

## 2021-11-11 DIAGNOSIS — M159 Polyosteoarthritis, unspecified: Secondary | ICD-10-CM | POA: Diagnosis not present

## 2021-11-11 NOTE — Progress Notes (Signed)
Maria [name] at [facility] [place], [place],  [phone]  Date:  11/11/2021   Name:  Maria [name]   DOB:  [birth year]   MRN:  [id number]  PCP:  Maria [name], MD    Chief Complaint: Form Completion (Concerns/ questions: none)   History of Present Illness:  Maria [name] is a 71 y.o. 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- she had recently brought to my attention difficulty getting down onto the floor or lifting heavier children She suffers from arthritis of her neck, back, knees She spoke to human resources and they gave her some forms to complete  Pt notes she can lift up to about 15 lbs  Patient Active Problem List   Diagnosis Date Noted   Mixed dyslipidemia 06/30/2021   Obesity (BMI 30.0-34.9) 06/30/2021   Cardiac murmur 06/30/2021   Arthritis 06/24/2021   Colon polyps 06/24/2021   Diverticulosis 06/24/2021   Hemorrhoids, external without complications 23/53/6144   Hemorrhoids, internal 06/24/2021   Hypertension 06/24/2021   Osteoporosis 06/24/2021   Tubular adenoma of colon 06/24/2021   GERD (gastroesophageal reflux disease) 12/07/2016   Prediabetes 10/13/2015   Gastro-esophageal reflux disease with esophagitis 11/20/2014   HH (hiatus hernia) 2014   NONSPECIFIC ABN FINDING RAD & OTH EXAM GI TRACT 09/19/2009    Past Medical History:  Diagnosis Date   Arthritis    Colon polyps    Diverticulosis    Gastro-esophageal reflux disease with esophagitis 11/20/2014   Endoscopy 10/2014 per DR. Hung   GERD (gastroesophageal reflux disease)    Hemorrhoids, external without complications    Hemorrhoids, internal    HH (hiatus hernia) 2014   [place] (hiatus hernia) 2014    Hypertension    NONSPECIFIC ABN FINDING RAD & OTH EXAM GI TRACT 09/19/2009   Qualifier: Diagnosis of  By: Ardis Hughs MD, Melene Plan    Osteoporosis    pre   Prediabetes 10/13/2015   Reflux esophagitis    Tubular adenoma of colon     Past Surgical History:  Procedure Laterality Date   COLONOSCOPY     [id number]   ESOPHAGEAL MANOMETRY N/A 12/13/2014   Procedure: ESOPHAGEAL MANOMETRY (EM);  Surgeon: Carol Ada, MD;  Location: [facility];  Service: Endoscopy;  Laterality: N/A;   POLYPECTOMY     SPINE SURGERY     Neck   UPPER GASTROINTESTINAL ENDOSCOPY      Social History   Tobacco Use   Smoking status: Never   Smokeless tobacco: Never  Vaping Use   Vaping Use: Never used  Substance Use Topics   Alcohol use: No    Alcohol/week: 0.0 standard drinks of alcohol   Drug use: No    Family History  Problem Relation Age of Onset   Hypertension Father    Arthritis Brother    Colon cancer Neg Hx    Rectal cancer Neg Hx    Esophageal cancer Neg Hx    Stomach cancer Neg Hx    Pancreatic cancer Neg Hx    Prostate cancer Neg Hx    Colon polyps Neg Hx     No Known Allergies  Medication list has been reviewed  and updated.  Current Outpatient Medications on File Prior to Visit  Medication Sig Dispense Refill   acetaminophen (TYLENOL) 325 MG tablet Take 650 mg by mouth every 6 (six) hours as needed for pain or headache.     b complex vitamins tablet Take 1 tablet by mouth daily.     celecoxib (CELEBREX) 100 MG capsule Take 1 capsule (100 mg total) by mouth 2 (two) times daily. Use as needed for arthritis pain 60 capsule 1   Cholecalciferol (VITAMIN D3) 1.25 MG (50000 UT) CAPS Take 1 weekly for 12 weeks 12 capsule 0   Multiple Vitamin (MULTIVITAMIN) tablet Take 1 tablet by mouth daily.     No current facility-administered medications on file prior to visit.    Review of Systems:  As per HPI- otherwise negative.   Physical Examination: Vitals:   11/11/21 1529  BP: 118/73   Pulse: 78  Resp: 18  Temp: 97.6 F (36.4 C)  SpO2: 99%   Vitals:   11/11/21 1529  Weight: 186 lb 6.4 oz (84.6 kg)  Height: '5\' 6"'$  (1.676 m)   Body mass index is 30.09 kg/m. Ideal Body Weight: Weight in (lb) to have BMI = 25: 154.6  GEN: no acute distress.  Mild obesity, looks well HEENT: Atraumatic, Normocephalic.  Ears and Nose: No external deformity. CV: RRR, No M/G/R. No JVD. No thrill. No extra heart sounds. PULM: CTA B, no wheezes, crackles, rhonchi. No retractions. No resp. distress. No accessory muscle use. EXTR: No c/c/e PSYCH: Normally interactive. Conversant.    Assessment and Plan: Primary osteoarthritis involving multiple joints  Patient with history of arthritis in several joints.  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  Completed paperwork for her today Lifting limit 15 pounds Avoid sitting or kneeling on floor Avoid use of chairs that are very low to the ground Signed Lamar Blinks, MD

## 2022-01-12 ENCOUNTER — Other Ambulatory Visit: Payer: Self-pay | Admitting: Family

## 2022-01-12 ENCOUNTER — Ambulatory Visit (INDEPENDENT_AMBULATORY_CARE_PROVIDER_SITE_OTHER): Payer: Medicare Other | Admitting: Family

## 2022-01-12 ENCOUNTER — Encounter: Payer: Self-pay | Admitting: Family

## 2022-01-12 VITALS — BP 134/78 | HR 73 | Temp 97.0°F | Resp 16 | Ht 66.0 in | Wt 184.2 lb

## 2022-01-12 DIAGNOSIS — J019 Acute sinusitis, unspecified: Secondary | ICD-10-CM

## 2022-01-12 MED ORDER — IPRATROPIUM BROMIDE 0.03 % NA SOLN: 2.0000 | Freq: Two times a day (BID) | NASAL | 1 refills | Status: DC

## 2022-01-12 MED ORDER — DOXYCYCLINE HYCLATE 100 MG PO TABS: 100.0000 mg | ORAL_TABLET | Freq: Two times a day (BID) | ORAL | 0 refills | Status: DC

## 2022-01-12 NOTE — Patient Instructions (Signed)
You are due for your yearly follow up with Dr. Lorelei Pont in March 2024;

## 2022-01-12 NOTE — Progress Notes (Signed)
Maria [name] is a 71 y.o. female with the following history as recorded in EpicCare:  Patient Active Problem List   Diagnosis Date Noted   Mixed dyslipidemia 06/30/2021   Obesity (BMI 30.0-34.9) 06/30/2021   Cardiac murmur 06/30/2021   Arthritis 06/24/2021   Colon polyps 06/24/2021   Diverticulosis 06/24/2021   Hemorrhoids, external without complications 42/39/5320   Hemorrhoids, internal 06/24/2021   Hypertension 06/24/2021   Osteoporosis 06/24/2021   Tubular adenoma of colon 06/24/2021   GERD (gastroesophageal reflux disease) 12/07/2016   Prediabetes 10/13/2015   Gastro-esophageal reflux disease with esophagitis 11/20/2014   HH (hiatus hernia) 2014   NONSPECIFIC ABN FINDING RAD & OTH EXAM GI TRACT 09/19/2009    Current Outpatient Medications  Medication Sig Dispense Refill   acetaminophen (TYLENOL) 325 MG tablet Take 650 mg by mouth every 6 (six) hours as needed for pain or headache.     b complex vitamins tablet Take 1 tablet by mouth daily.     celecoxib (CELEBREX) 100 MG capsule Take 1 capsule (100 mg total) by mouth 2 (two) times daily. Use as needed for arthritis pain 60 capsule 1   Cholecalciferol (VITAMIN D3) 1.25 MG (50000 UT) CAPS Take 1 weekly for 12 weeks 12 capsule 0   doxycycline (VIBRA-TABS) 100 MG tablet Take 1 tablet (100 mg total) by mouth 2 (two) times daily. 14 tablet 0   ipratropium (ATROVENT) 0.03 % nasal spray Place 2 sprays into both nostrils every 12 (twelve) hours. 30 mL 1   Multiple Vitamin (MULTIVITAMIN) tablet Take 1 tablet by mouth daily.     No current facility-administered medications for this visit.    Allergies: Patient has no known allergies.  Past Medical History:  Diagnosis Date   Arthritis    Colon polyps    Diverticulosis    Gastro-esophageal reflux disease with esophagitis 11/20/2014   Endoscopy 10/2014 per DR. Hung   GERD (gastroesophageal reflux disease)    Hemorrhoids, external without complications    Hemorrhoids, internal     HH (hiatus hernia) 2014   [place] (hiatus hernia) 2014   Hypertension    NONSPECIFIC ABN FINDING RAD & OTH EXAM GI TRACT 09/19/2009   Qualifier: Diagnosis of  By: Ardis Hughs MD, Melene Plan    Osteoporosis    pre   Prediabetes 10/13/2015   Reflux esophagitis    Tubular adenoma of colon     Past Surgical History:  Procedure Laterality Date   COLONOSCOPY     [id number]   ESOPHAGEAL MANOMETRY N/A 12/13/2014   Procedure: ESOPHAGEAL MANOMETRY (EM);  Surgeon: Carol Ada, MD;  Location: [facility];  Service: Endoscopy;  Laterality: N/A;   POLYPECTOMY     SPINE SURGERY     Neck   UPPER GASTROINTESTINAL ENDOSCOPY      Family History  Problem Relation Age of Onset   Hypertension Father    Arthritis Brother    Colon cancer Neg Hx    Rectal cancer Neg Hx    Esophageal cancer Neg Hx    Stomach cancer Neg Hx    Pancreatic cancer Neg Hx    Prostate cancer Neg Hx    Colon polyps Neg Hx     Social History   Tobacco Use   Smoking status: Never   Smokeless tobacco: Never  Substance Use Topics   Alcohol use: No    Alcohol/week: 0.0 standard drinks of alcohol    Subjective:   Nasal congestion x 1 week; feels like mucus is thicker/ changing in color;  no relief with OTC nasal decongestant; notes that she was hoarse at work earlier today;  Took COVID vaccine over a week ago;     Objective:  Vitals:   01/12/22 1421  BP: 134/78  Pulse: 73  Resp: 16  Temp: (!) 97 F (36.1 C)  TempSrc: Oral  SpO2: 96%  Weight: 184 lb 3.2 oz (83.6 kg)  Height: '5\' 6"'$  (1.676 m)    General: Well developed, well nourished, in no acute distress  Skin : Warm and dry.  Head: Normocephalic and atraumatic  Eyes: Sclera and conjunctiva clear; pupils round and reactive to light; extraocular movements intact  Ears: External normal; canals clear; tympanic membranes normal  Oropharynx: Pink, supple. No suspicious lesions  Neck: Supple without thyromegaly, adenopathy  Lungs: Respirations unlabored; clear to auscultation  bilaterally without wheeze, rales, rhonchi  CVS exam: normal rate and regular rhythm.  Neurologic: Alert and oriented; speech intact; face symmetrical; moves all extremities well; CNII-XII intact without focal deficit   Assessment:  1. Acute sinusitis, recurrence not specified, unspecified location     Plan:  Rx for Doxycycline 100 mg bid x 7 days, Atrovent nasal spray; increase fluids, rest and work note given as requested; follow up worse, no better.   No follow-ups on file.  No orders of the defined types were placed in this encounter.   Requested Prescriptions   Signed Prescriptions Disp Refills   ipratropium (ATROVENT) 0.03 % nasal spray 30 mL 1    Sig: Place 2 sprays into both nostrils every 12 (twelve) hours.   doxycycline (VIBRA-TABS) 100 MG tablet 14 tablet 0    Sig: Take 1 tablet (100 mg total) by mouth 2 (two) times daily.

## 2022-02-10 ENCOUNTER — Ambulatory Visit: Payer: Medicare Other | Attending: Cardiology | Admitting: Cardiology

## 2022-02-10 ENCOUNTER — Encounter: Payer: Self-pay | Admitting: Cardiology

## 2022-02-10 VITALS — BP 150/74 | HR 72 | Ht 66.0 in | Wt 185.1 lb

## 2022-02-10 DIAGNOSIS — I10 Essential (primary) hypertension: Secondary | ICD-10-CM | POA: Diagnosis not present

## 2022-02-10 DIAGNOSIS — E782 Mixed hyperlipidemia: Secondary | ICD-10-CM

## 2022-02-10 NOTE — Progress Notes (Signed)
Cardiology Office Note:    Date:  02/10/2022   ID:  Maria [name], DOB 08-May-1950, MRN [id number]  PCP:  [name], MD  Cardiologist:  Jenean Lindau, MD   Referring MD: [name], MD    ASSESSMENT:    1. Mixed dyslipidemia   2. Primary hypertension    PLAN:    In order of problems listed above:  Primary prevention stressed with the patient.  Importance of compliance with diet medication stressed and she vocalized understanding.  She was advised to walk at least half an hour a day 5 days a week and she promises to do so. Essential hypertension: Blood pressure is stable and diet was emphasized.  She is going to keep a track of her blood pressures at home and bring it to [place] when she comes for a CT scan.  Salt intake issues and diet was emphasized. Mixed dyslipidemia: Lipids are markedly elevated.  She will come back for blood work in the next few days.  Diet was emphasized.  For coronary restratification I recommended calcium scoring and she is willing to do at this time.  Further recommendations will depend on the findings of this test. Weight reduction stressed risks of obesity explained and she plans to do better. Patient will be seen in follow-up appointment in 12 months or earlier if the patient has any concerns    Medication Adjustments/Labs and Tests Ordered: Current medicines are reviewed at length with the patient today.  Concerns regarding medicines are outlined above.  Orders Placed This Encounter  Procedures   CT CARDIAC SCORING   Comprehensive metabolic panel   Lipid panel   TSH   No orders of the defined types were placed in this encounter.    No chief complaint on file.    History of Present Illness:    Maria [name] is a 71 y.o. female.  Patient has past medical history of essential hypertension and dyslipidemia.  She denies any problems at this time and takes care of activities of daily living.  She states that she leads a sedentary  lifestyle.  No chest pain orthopnea or PND.  She has not been checking her blood pressures at home.  At the time of my evaluation, the patient is alert awake oriented and in no distress.  Past Medical History:  Diagnosis Date   Arthritis    Cardiac murmur 06/30/2021   Colon polyps    Diverticulosis    Gastro-esophageal reflux disease with esophagitis 11/20/2014   Endoscopy 10/2014 per DR. Hung   GERD (gastroesophageal reflux disease)    Hemorrhoids, external without complications    Hemorrhoids, internal    HH (hiatus hernia) 2014   [place] (hiatus hernia) 2014   Hypertension    Mixed dyslipidemia 06/30/2021   NONSPECIFIC ABN FINDING RAD & OTH EXAM GI TRACT 09/19/2009   Qualifier: Diagnosis of  By: Ardis Hughs MD, Melene Plan    Obesity (BMI 30.0-34.9) 06/30/2021   Osteoporosis    pre   Prediabetes 10/13/2015   Tubular adenoma of colon     Past Surgical History:  Procedure Laterality Date   COLONOSCOPY     [id number]   ESOPHAGEAL MANOMETRY N/A 12/13/2014   Procedure: ESOPHAGEAL MANOMETRY (EM);  Surgeon: Carol Ada, MD;  Location: [facility];  Service: Endoscopy;  Laterality: N/A;   POLYPECTOMY     SPINE SURGERY     Neck   UPPER GASTROINTESTINAL ENDOSCOPY      Current Medications: Current Meds  Medication Sig   acetaminophen (TYLENOL) 325 MG tablet Take 650 mg by mouth every 6 (six) hours as needed for pain or headache.   b complex vitamins tablet Take 1 tablet by mouth daily.   celecoxib (CELEBREX) 100 MG capsule Take 1 capsule (100 mg total) by mouth 2 (two) times daily. Use as needed for arthritis pain   Cholecalciferol (VITAMIN D3) 1.25 MG (50000 UT) CAPS Take 1 weekly for 12 weeks   Multiple Vitamin (MULTIVITAMIN) tablet Take 1 tablet by mouth daily.     Allergies:   Patient has no known allergies.   Social History   Socioeconomic History   Marital status: Widowed    Spouse name: Not on file   Number of children: 3   Years of education: 41   Highest education level:  Not on file  Occupational History   Occupation: Retired  Tobacco Use   Smoking status: Never   Smokeless tobacco: Never  Vaping Use   Vaping Use: Never used  Substance and Sexual Activity   Alcohol use: No    Alcohol/week: 0.0 standard drinks of alcohol   Drug use: No   Sexual activity: Never  Other Topics Concern   Not on file  Social History Narrative   Not on file   Social Determinants of Health   Financial Resource Strain: Low Risk  (10/06/2021)   Overall Financial Resource Strain (CARDIA)    Difficulty of Paying Living Expenses: Not hard at all  Food Insecurity: No Food Insecurity (10/06/2021)   Hunger Vital Sign    Worried About Running Out of Food in the Last Year: Never true    [place] in the Last Year: Never true  Transportation Needs: No Transportation Needs (10/06/2021)   PRAPARE - Hydrologist (Medical): No    Lack of Transportation (Non-Medical): No  Physical Activity: Inactive (10/06/2021)   Exercise Vital Sign    Days of Exercise per Week: 0 days    Minutes of Exercise per Session: 0 min  Stress: No Stress Concern Present (10/06/2021)   [place]    Feeling of Stress : Not at all  Social Connections: Moderately Isolated (10/06/2021)   Social Connection and Isolation Panel [NHANES]    Frequency of Communication with Friends and Family: More than three times a week    Frequency of Social Gatherings with Friends and Family: More than three times a week    Attends Religious Services: More than 4 times per year    Active Member of [employer] or Organizations: No    Attends Archivist Meetings: Never    Marital Status: Widowed     Family History: The patient's family history includes Arthritis in her brother; Hypertension in her father. There is no history of Colon cancer, Rectal cancer, Esophageal cancer, Stomach cancer, Pancreatic cancer, Prostate cancer,  or Colon polyps.  ROS:   Please see the history of present illness.    All other systems reviewed and are negative.  EKGs/Labs/Other Studies Reviewed:    The following studies were reviewed today: I discussed my findings with the patient at length.   Recent Labs: 03/26/2021: ALT 20; BUN 18; Creatinine, Ser 0.92; Potassium 4.5; Sodium 138 05/27/2021: Hemoglobin 13.5; Platelets 270.0; TSH 2.11  Recent Lipid Panel    Component Value Date/Time   CHOL 247 (H) 05/27/2021 1049   TRIG 103.0 05/27/2021 1049   HDL 49.30 05/27/2021 1049  CHOLHDL 5 05/27/2021 1049   VLDL 20.6 05/27/2021 1049   LDLCALC 177 (H) 05/27/2021 1049    Physical Exam:    VS:  BP (!) 150/74   Pulse 72   Ht '5\' 6"'$  (1.676 m)   Wt 185 lb 1.9 oz (84 kg)   SpO2 97%   BMI 29.88 kg/m     Wt Readings from Last 3 Encounters:  02/10/22 185 lb 1.9 oz (84 kg)  01/12/22 184 lb 3.2 oz (83.6 kg)  11/11/21 186 lb 6.4 oz (84.6 kg)     GEN: Patient is in no acute distress HEENT: Normal NECK: No JVD; No carotid bruits LYMPHATICS: No lymphadenopathy CARDIAC: Hear sounds regular, 2/6 systolic murmur at the apex. RESPIRATORY:  Clear to auscultation without rales, wheezing or rhonchi  ABDOMEN: Soft, non-tender, non-distended MUSCULOSKELETAL:  No edema; No deformity  SKIN: Warm and dry NEUROLOGIC:  Alert and oriented x 3 PSYCHIATRIC:  Normal affect   Signed, Jenean Lindau, MD  02/10/2022 11:45 AM    [place]

## 2022-02-10 NOTE — Patient Instructions (Signed)
Medication Instructions:  Your physician recommends that you continue on your current medications as directed. Please refer to the Current Medication list given to you today.  *If you need a refill on your cardiac medications before your next appointment, please call your pharmacy*   Lab Work: Your physician recommends that you return for lab work in: the next few days You need to have labs done when you are fasting.  You can come Monday through Friday 8:30 am to 12:00 pm and 1:15 to 4:30. You do not need to make an appointment as the order has already been placed. The labs you are going to have done are CMP TSH and Lipids.   [address] in [place]. They also close daily for lunch for 12-1.  or [place] [address]nd floor M-W 8-11:30 and 1-4:30 and Thursday and Friday 8-11:30.  If you have labs (blood work) drawn today and your tests are completely normal, you will receive your results only by: [place] (if you have MyChart) OR A paper copy in the mail If you have any lab test that is abnormal or we need to change your treatment, we will call you to review the results.   Testing/Procedures: We will order CT coronary calcium score. It will cost $99.00 and is not covered by insurance.  Please call to schedule.    [facility] [address] [place], [place] [address] [phone]    Follow-Up: At [facility], you and your health needs are our priority.  As part of our continuing mission to provide you with exceptional heart care, we have created designated Provider Care Teams.  These Care Teams include your primary Cardiologist (physician) and Advanced Practice Providers (APPs -  Physician Assistants and Nurse Practitioners) who all work together to provide you with the care you need, when you need it.  We recommend signing up for the patient portal called "MyChart".  Sign up information is provided on this After Visit  Summary.  MyChart is used to connect with patients for Virtual Visits (Telemedicine).  Patients are able to view lab/test results, encounter notes, upcoming appointments, etc.  Non-urgent messages can be sent to your provider as well.   To learn more about what you can do with MyChart, go to [email or web address].    Your next appointment:   12 month(s)  The format for your next appointment:   In Person  Provider:   Jyl Heinz, MD   Other Instructions NA

## 2022-02-15 DIAGNOSIS — I10 Essential (primary) hypertension: Secondary | ICD-10-CM | POA: Diagnosis not present

## 2022-02-15 DIAGNOSIS — E782 Mixed hyperlipidemia: Secondary | ICD-10-CM | POA: Diagnosis not present

## 2022-02-16 LAB — TSH: TSH: 2.62 u[IU]/mL (ref 0.450–4.500)

## 2022-02-16 LAB — COMPREHENSIVE METABOLIC PANEL
ALT: 17 IU/L (ref 0–32)
AST: 17 IU/L (ref 0–40)
Albumin/Globulin Ratio: 1.6 (ref 1.2–2.2)
Albumin: 4.5 g/dL (ref 3.8–4.8)
Alkaline Phosphatase: 106 IU/L (ref 44–121)
BUN/Creatinine Ratio: 18 (ref 12–28)
BUN: 17 mg/dL (ref 8–27)
Bilirubin Total: 0.4 mg/dL (ref 0.0–1.2)
CO2: 23 mmol/L (ref 20–29)
Calcium: 9.8 mg/dL (ref 8.7–10.3)
Chloride: 99 mmol/L (ref 96–106)
Creatinine, Ser: 0.96 mg/dL (ref 0.57–1.00)
Globulin, Total: 2.8 g/dL (ref 1.5–4.5)
Glucose: 77 mg/dL (ref 70–99)
Potassium: 4.5 mmol/L (ref 3.5–5.2)
Sodium: 136 mmol/L (ref 134–144)
Total Protein: 7.3 g/dL (ref 6.0–8.5)
eGFR: 63 mL/min/{1.73_m2} (ref >59)

## 2022-02-16 LAB — LIPID PANEL
Chol/HDL Ratio: 4.5 ratio — ABNORMAL HIGH (ref 0.0–4.4)
Cholesterol, Total: 234 mg/dL — ABNORMAL HIGH (ref 100–199)
HDL: 52 mg/dL (ref >39)
LDL Chol Calc (NIH): 161 mg/dL — ABNORMAL HIGH (ref 0–99)
Triglycerides: 117 mg/dL (ref 0–149)
VLDL Cholesterol Cal: 21 mg/dL (ref 5–40)

## 2022-02-17 ENCOUNTER — Telehealth: Payer: Self-pay

## 2022-02-17 DIAGNOSIS — E782 Mixed hyperlipidemia: Secondary | ICD-10-CM

## 2022-02-17 DIAGNOSIS — I700 Atherosclerosis of aorta: Secondary | ICD-10-CM | POA: Insufficient documentation

## 2022-02-17 MED ORDER — ROSUVASTATIN CALCIUM 10 MG PO TABS: 10.0000 mg | ORAL_TABLET | Freq: Every day | ORAL | 3 refills | Status: DC

## 2022-02-17 NOTE — Telephone Encounter (Signed)
-----   Message from Jenean Lindau, MD sent at 02/16/2022  8:49 AM EST ----- Crestor 10 mg daily and liver lipid check in 6 weeks.  Diet and exercise.  Copy primary care.  Please put atherosclerotic aorta in the diagnosis. Jenean Lindau, MD 02/16/2022 8:49 AM

## 2022-02-23 ENCOUNTER — Ambulatory Visit (HOSPITAL_BASED_OUTPATIENT_CLINIC_OR_DEPARTMENT_OTHER)
Admission: RE | Admit: 2022-02-23 | Discharge: 2022-02-23 | Disposition: A | Discharge status: Home / Self Care | Payer: Self-pay | Source: Ambulatory Visit | Attending: Cardiology | Admitting: Cardiology

## 2022-02-23 DIAGNOSIS — E782 Mixed hyperlipidemia: Secondary | ICD-10-CM | POA: Insufficient documentation

## 2022-02-24 ENCOUNTER — Encounter: Payer: Self-pay | Admitting: Family Medicine

## 2022-03-12 IMAGING — MG DIGITAL SCREENING BILAT W/ TOMO W/ CAD
8 series · 8 of 24 positions shown · non-contrast
Comparison: Previous exam(s).

CLINICAL DATA: Screening.

EXAM:
DIGITAL SCREENING BILATERAL MAMMOGRAM WITH TOMO AND CAD

[R MLO synth-2D]
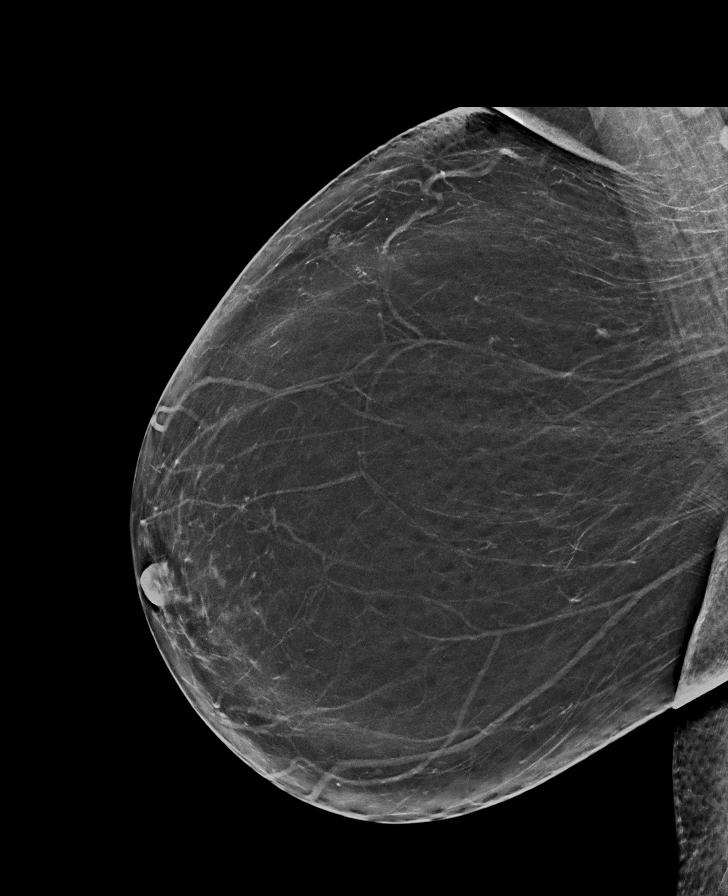

[L CC synth-2D]
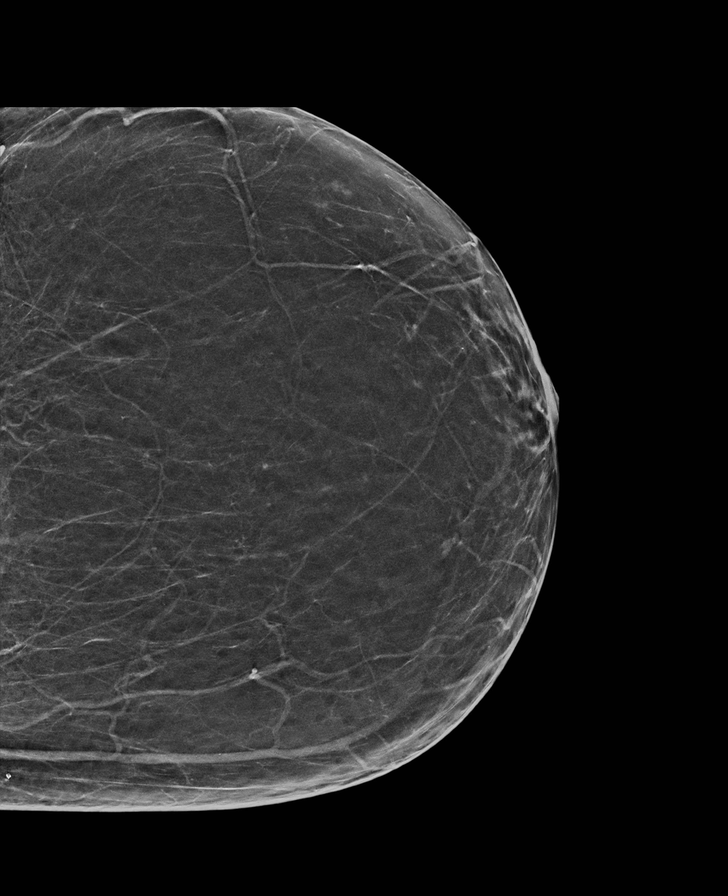

[R CC synth-2D]
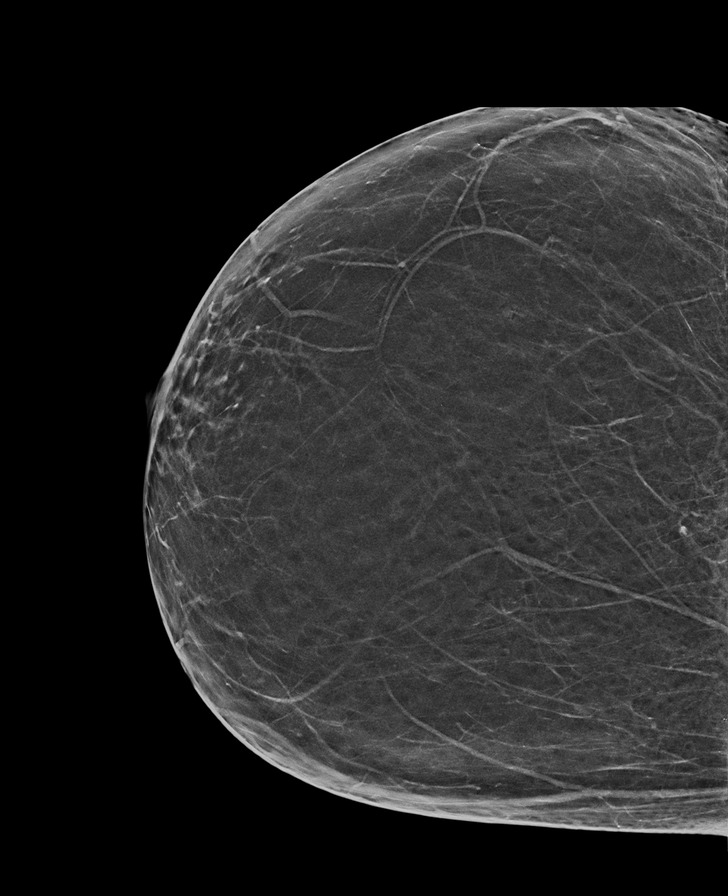

[L MLO synth-2D]
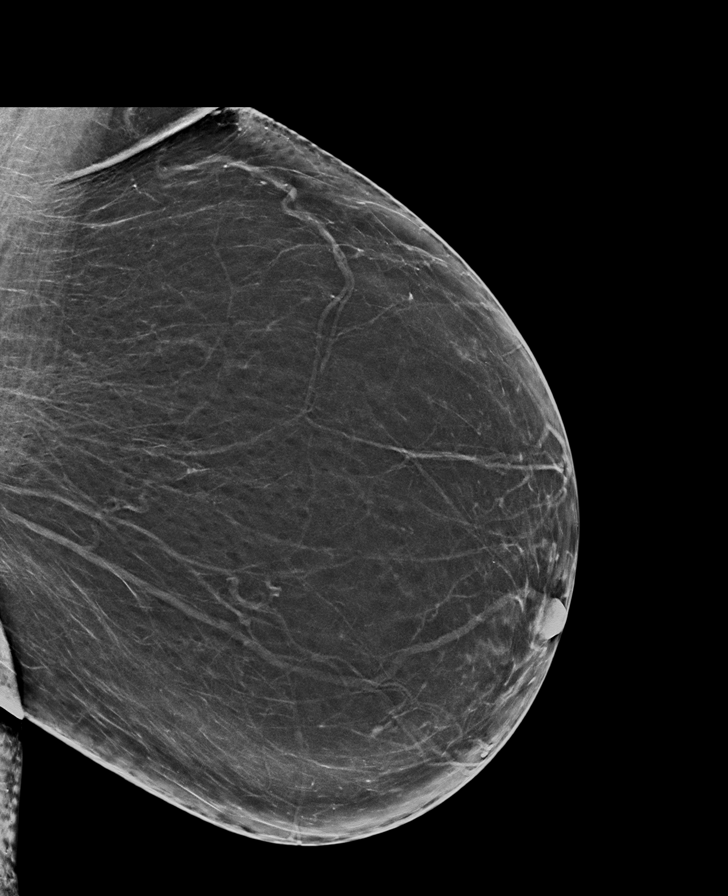

[R MLO tomo · tomo slice 36/71.0]
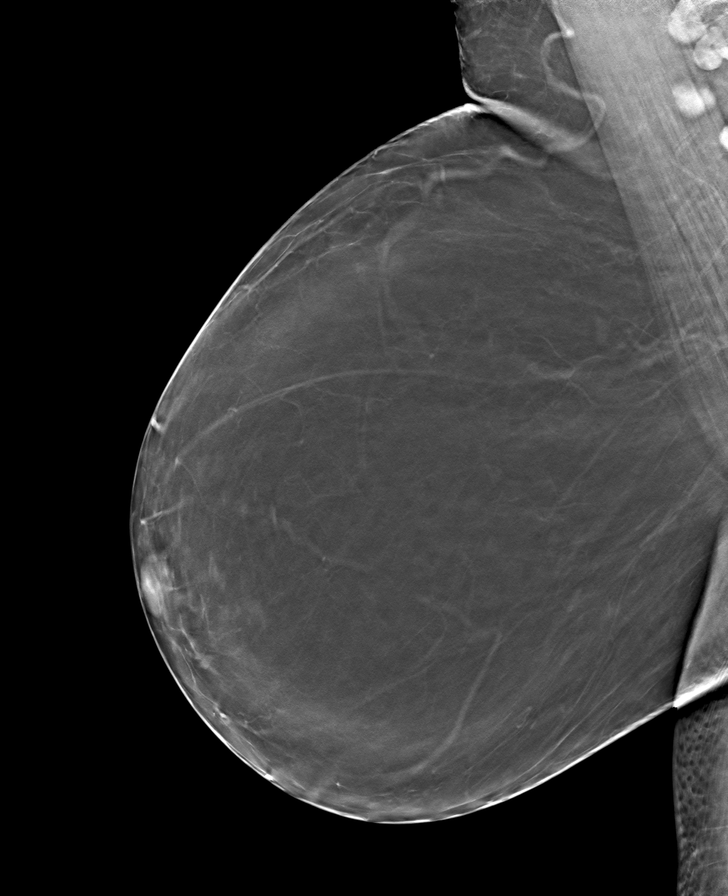

[L MLO tomo · tomo slice 35/70.0]
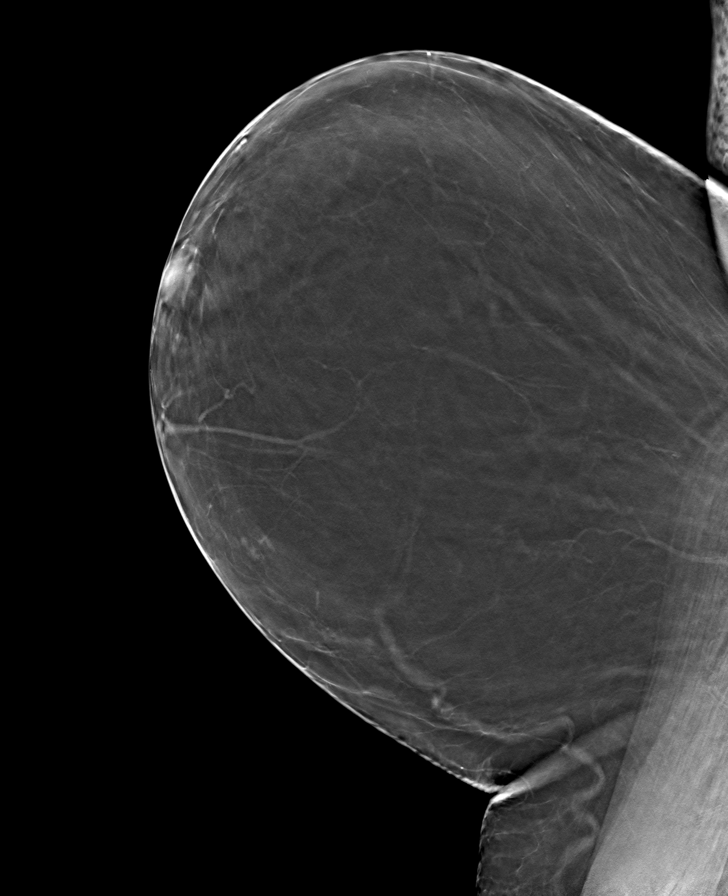

[L CC tomo · tomo slice 32/63.0]
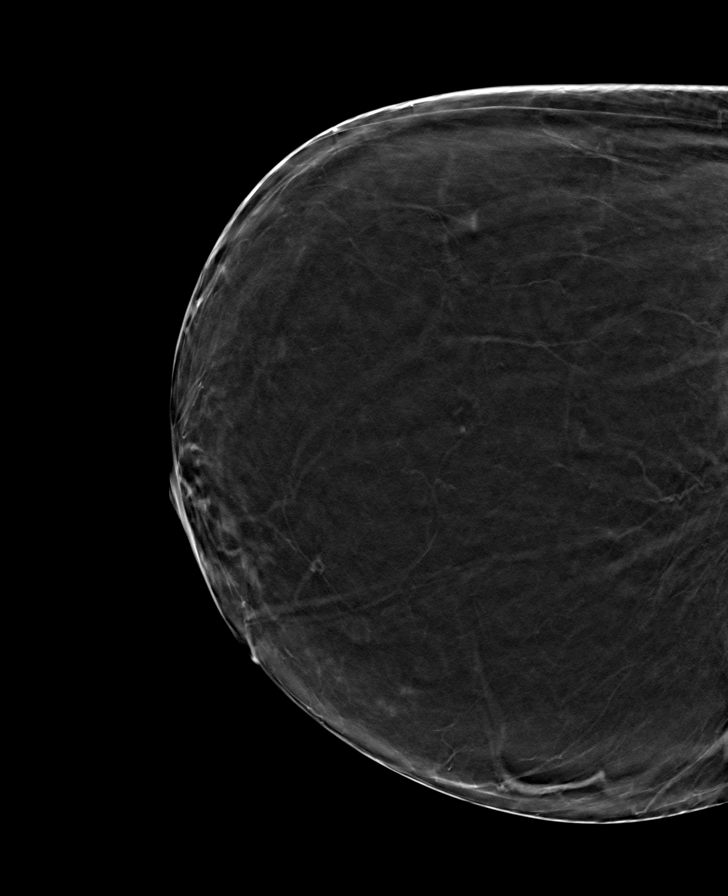

[R CC tomo · tomo slice 31/60.0]
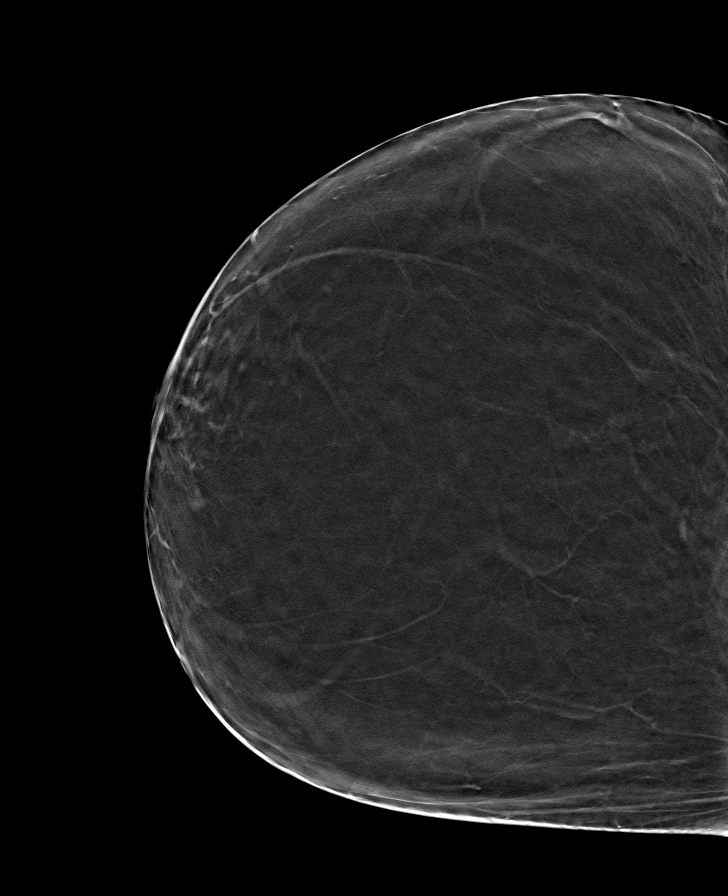

[8 of 24 positions shown; findings below may reference images not displayed]

ACR Breast Density Category b: There are scattered areas of
fibroglandular density.
FINDINGS: There are no findings suspicious for malignancy. Images were
processed with CAD.
IMPRESSION: No mammographic evidence of malignancy. A result letter of this
screening mammogram will be mailed directly to the patient.

RECOMMENDATION:
Screening mammogram in one year. (Code:[id number])

BI-RADS CATEGORY  1: Negative.

## 2022-03-17 DIAGNOSIS — H43813 Vitreous degeneration, bilateral: Secondary | ICD-10-CM | POA: Diagnosis not present

## 2022-03-17 DIAGNOSIS — H00024 Hordeolum internum left upper eyelid: Secondary | ICD-10-CM | POA: Diagnosis not present

## 2022-03-17 DIAGNOSIS — H2513 Age-related nuclear cataract, bilateral: Secondary | ICD-10-CM | POA: Diagnosis not present

## 2022-03-30 ENCOUNTER — Encounter: Payer: Self-pay | Admitting: Gastroenterology

## 2022-03-30 ENCOUNTER — Ambulatory Visit: Payer: Medicare Other | Admitting: Gastroenterology

## 2022-03-30 VITALS — BP 140/80 | HR 67 | Ht 66.0 in | Wt 186.0 lb

## 2022-03-30 DIAGNOSIS — K2100 Gastro-esophageal reflux disease with esophagitis, without bleeding: Secondary | ICD-10-CM | POA: Diagnosis not present

## 2022-03-30 DIAGNOSIS — R140 Abdominal distension (gaseous): Secondary | ICD-10-CM

## 2022-03-30 DIAGNOSIS — K449 Diaphragmatic hernia without obstruction or gangrene: Secondary | ICD-10-CM

## 2022-03-30 DIAGNOSIS — K5900 Constipation, unspecified: Secondary | ICD-10-CM

## 2022-03-30 MED ORDER — OMEPRAZOLE 20 MG PO CPDR: 20.0000 mg | DELAYED_RELEASE_CAPSULE | Freq: Every day | ORAL | 3 refills | Status: AC

## 2022-03-30 MED ORDER — POLYETHYLENE GLYCOL 3350 17 G PO PACK: 17.0000 g | PACK | Freq: Every day | ORAL | 0 refills | Status: AC

## 2022-03-30 MED ORDER — IBGARD 90 MG PO CPCR: ORAL_CAPSULE | ORAL | 0 refills | Status: AC

## 2022-03-30 NOTE — Patient Instructions (Signed)
If you are age 73 or older, your body mass index should be between 23-30. Your Body mass index is 30.02 kg/m. If this is out of the aforementioned range listed, please consider follow up with your Primary Care Provider.  If you are age 74 or younger, your body mass index should be between 19-25. Your Body mass index is 30.02 kg/m. If this is out of the aformentioned range listed, please consider follow up with your Primary Care Provider.   ________________________________________________________   We have sent the following medications to your pharmacy for you to pick up at your convenience: Omeprazole 20 mg: Take once daily  We are giving you a Low-FODMAP diet handout today. FODMAPs are short-chain carbohydrates (sugars) that are highly fermentable, which means that they go through chemical changes in the GI system, and are poorly absorbed during digestion. When FODMAPs reach the colon (large intestine), bacteria ferment these sugars, turning them into gas and chemicals. This stretches the walls of the colon, causing abdominal bloating, distension, cramping, pain, and/or changes in bowel habits in many patients with IBS. FODMAPs are not unhealthy or harmful, but may exacerbate GI symptoms in those with sensitive GI tracts.  Please purchase the following medications over the counter and take as directed: Miralax: Take once daily and titrate up as needed to achieve 1 bowel movement daily  We have given you samples of the following medication to take:  IBgard: Take as needed  Please let us know if you are not better in a week or two via MyChart.  Thank you for entrusting me with your care and for choosing [facility], Dr. [place]

## 2022-03-30 NOTE — Progress Notes (Signed)
HPI :  72 year old female here for follow-up visit for reflux and bloating.  She was last seen in December 2021, at that time she had an EGD.  Recall she has a history of moderate-sized hiatal hernia with history of LA grade D esophagitis in the past.  She has historically not wanted to take any medications to treat her reflux.  Her last endoscopy in December 2021 showed interval healing of LA grade D esophagitis on low-dose PPI however she still had some mild inflammatory change/LA grade B esophagitis.  No evidence of Barrett's.  I had recommended she take omeprazole 20 mg daily long-term to minimize her risk for significant esophagitis while keeping her on low-dose as she really wants to minimize medication use.  Sounds like she has not been taking any omeprazole and stopped it at some point along the line.  She does have belching that bothers her and gas.  No heartburn for the most part during the day but can have this when she lies down.  She is eating okay, no nausea or vomiting.  No dysphagia.  Main issue she is having is worsening gas and bloating with foul odor of her stools.  She has endorsed some constipation, having a bowel movement once every 3 to 4 days.  She has not been taking any laxatives.  Denies any dietary changes.  She does eat a lot of vegetables.  No blood in her stools.  She is taken some Gas-X which questionably helps.  Her last colonoscopy was in September 2021, 1 adenoma removed.     Prior workup per Dr. Benson [place]: CT scan abdomen 09/17/16 - diverticulosis, otherwise DJD of lumbar spine Reportedly prior normal [place] and HIDA scan Colonoscopy 11/19/14 - 72m polyp, pancolonic diverticulosis Colonoscopy 07/04/2012 - multiple small adenomas, diverticulosis, hemorrhoids EGD 11/19/09 - reflux esophagitis, otherwise normal EGD [id number]- LA grade D esophagitis, 5cm hiatal hernia EGD 11/19/2014 - LA grade D esophagitis, 2cm hiatal hernia   Esophageal manometry 12/13/2014 - overall weak  peristalsis, IRP normal, 20% large breaks   Colonoscopy 12/07/19 - The perianal and digital rectal examinations were normal. - A 6 mm polyp was found in the ascending colon. The polyp was sessile. The polyp was removed with a cold snare. Resection and retrieval were complete. - Multiple medium-mouthed diverticula were found in the entire colon. - Internal hemorrhoids were found during retroflexion. - The exam was otherwise without abnormality.   Surgical [P], colon, ascending, polyp - TUBULAR ADENOMA, NEGATIVE FOR HIGH GRADE DYSPLASIA (X MULTIPLE).     EGD 01/17/17 - - A 5 cm hiatal hernia was present. - The exam of the esophagus was otherwise normal. Interval healing of esophagitis on omeprazole. - A large amount of food was found in the gastric body and in the gastric antrum, prohibiting examination of the stomach and duodenum - the procedure was aborted prematurely due to this finding.     EGD 02/22/20: - Esophagogastric landmarks were identified: the Z-line was found at 32 cm, the gastroesophageal junction was found at 32 cm and the upper extent of the gastric folds was found at 36 cm from the incisors. - A 4 cm hiatal hernia was present. - LA Grade B esophagitis was found at the GEJ. There was no obvious evidence of Barrett's esophagus otherwise. - The exam of the esophagus was otherwise normal. - The entire examined stomach was normal. - The duodenal bulb and second portion of the duodenum were normal.    Echo 07/14/21: EF  60-65%, mild LVH, grade I DD   Cardiac CT 02/23/22: IMPRESSION: Mild symmetric distal esophageal wall thickening, which may suggest esophagitis. Consider further evaluation with upper endoscopy if clinically indicated.  IMPRESSION: Coronary calcium score of 21.5. This was 24 percentile for age-, race-, and sex-matched controls.   CT renal stone study 07/11/20: IMPRESSION: 1. No acute abnormality. No urolithiasis. No hydronephrosis. 2. Mild  heterogeneous hepatic steatosis. 3. Moderate diffuse colonic diverticulosis. 4. Aortic Atherosclerosis (ICD10-I70.0).      Past Medical History:  Diagnosis Date   Arthritis    Cardiac murmur 06/30/2021   Colon polyps    Diverticulosis    Gastro-esophageal reflux disease with esophagitis 11/20/2014   Endoscopy 10/2014 per DR. Hung   GERD (gastroesophageal reflux disease)    Hemorrhoids, external without complications    Hemorrhoids, internal    HH (hiatus hernia) 2014   [place] (hiatus hernia) 2014   Hypertension    Mixed dyslipidemia 06/30/2021   NONSPECIFIC ABN FINDING RAD & OTH EXAM GI TRACT 09/19/2009   Qualifier: Diagnosis of  By: Ardis Hughs MD, Melene Plan    Obesity (BMI 30.0-34.9) 06/30/2021   Osteoporosis    pre   Prediabetes 10/13/2015   Tubular adenoma of colon      Past Surgical History:  Procedure Laterality Date   COLONOSCOPY     [id number]   ESOPHAGEAL MANOMETRY N/A 12/13/2014   Procedure: ESOPHAGEAL MANOMETRY (EM);  Surgeon: Carol Ada, MD;  Location: [facility];  Service: Endoscopy;  Laterality: N/A;   POLYPECTOMY     SPINE SURGERY     Neck   UPPER GASTROINTESTINAL ENDOSCOPY     Family History  Problem Relation Age of Onset   Hypertension Father    Arthritis Brother    Colon cancer Neg Hx    Rectal cancer Neg Hx    Esophageal cancer Neg Hx    Stomach cancer Neg Hx    Pancreatic cancer Neg Hx    Prostate cancer Neg Hx    Colon polyps Neg Hx    Social History   Tobacco Use   Smoking status: Never   Smokeless tobacco: Never  Vaping Use   Vaping Use: Never used  Substance Use Topics   Alcohol use: No    Alcohol/week: 0.0 standard drinks of alcohol   Drug use: No   Current Outpatient Medications  Medication Sig Dispense Refill   acetaminophen (TYLENOL) 325 MG tablet Take 650 mg by mouth every 6 (six) hours as needed for pain or headache.     b complex vitamins tablet Take 1 tablet by mouth daily.     Cholecalciferol (VITAMIN D3) 1.25 MG (50000 UT)  CAPS Take 1 weekly for 12 weeks 12 capsule 0   celecoxib (CELEBREX) 100 MG capsule Take 1 capsule (100 mg total) by mouth 2 (two) times daily. Use as needed for arthritis pain (Patient not taking: Reported on 03/30/2022) 60 capsule 1   Multiple Vitamin (MULTIVITAMIN) tablet Take 1 tablet by mouth daily. (Patient not taking: Reported on 03/30/2022)     rosuvastatin (CRESTOR) 10 MG tablet Take 1 tablet (10 mg total) by mouth daily. (Patient not taking: Reported on 03/30/2022) 90 tablet 3   No current facility-administered medications for this visit.   No Known Allergies   Review of Systems: All systems reviewed and negative except where noted in HPI.   Lab Results  Component Value Date   WBC 8.3 05/27/2021   HGB 13.5 05/27/2021   HCT 40.9 05/27/2021   MCV 90.2  05/27/2021   PLT 270.0 05/27/2021    Lab Results  Component Value Date   CREATININE 0.96 02/15/2022   BUN 17 02/15/2022   NA 136 02/15/2022   K 4.5 02/15/2022   CL 99 02/15/2022   CO2 23 02/15/2022    Lab Results  Component Value Date   ALT 17 02/15/2022   AST 17 02/15/2022   ALKPHOS 106 02/15/2022   BILITOT 0.4 02/15/2022     Physical Exam: BP (!) 160/98   Pulse 75   Ht '5\' 6"'$  (1.676 m)   Wt 186 lb (84.4 kg)   BMI 30.02 kg/m  Constitutional: Pleasant,well-developed, female in no acute distress. Neurological: Alert and oriented to person place and time. Psychiatric: Normal mood and affect. Behavior is normal.   ASSESSMENT: 72 y.o. female here for assessment of the following  1. Gastroesophageal reflux disease with esophagitis without hemorrhage   2. Hiatal hernia   3. Bloating   4. Constipation, unspecified constipation type    Patient with longstanding history of hiatal hernia with reflux esophagitis.  Numerous EGDs in the past with LA grade D esophagitis.  She had improved and had some healing on PPI however she does not like taking medications for this.  She is having belching and nocturnal reflux.  I  really think the benefits of PPI outweigh the risks, especially at low-dose, given her history of significant esophagitis.  Counseled her she is at risk for Barrett's and complications from that and esophageal stricturing without therapy.  She is not interested in hiatal hernia repair which would be the alternative.  She agrees to resume omeprazole at 20 mg daily after discussion of risks and benefits of this.  Hopefully this will help some of her upper tract symptoms.  Otherwise we discussed her bloating and bowel issues, I think constipation is making her bloating worse.  Will start her on MiraLAX daily and she can titrate up that as needed for goal of 1 bowel movement per day.  Also reviewed low FODMAP diet with her just to see if we can eliminate some foods that she is eating that could be gas producers, she is eating a lot of vegetables which could be related.  I gave her some IBgard samples to use as needed for discomfort and cramps from the bloating.  She will call me in a few weeks if no benefit.  Colonoscopy up-to-date.   PLAN: - omeprazole '20mg'$  / day - counseled on risks /benefits - low FODMAP diet handouts given - MIralax daily and titrate and up as needed for goal 1 BM per day - IB gard samples to use PRN - call if no benefit in a few weeks  Jolly Mango, MD [facility]

## 2022-05-17 ENCOUNTER — Telehealth: Payer: Self-pay | Admitting: Family Medicine

## 2022-05-17 ENCOUNTER — Encounter (INDEPENDENT_AMBULATORY_CARE_PROVIDER_SITE_OTHER): Payer: Medicare Other | Admitting: Family Medicine

## 2022-05-17 DIAGNOSIS — J019 Acute sinusitis, unspecified: Secondary | ICD-10-CM

## 2022-05-17 MED ORDER — AMOXICILLIN 500 MG PO CAPS: 1000.0000 mg | ORAL_CAPSULE | Freq: Two times a day (BID) | ORAL | 0 refills | Status: DC

## 2022-05-17 NOTE — Telephone Encounter (Signed)
Please see the MyChart message reply(ies) for my assessment and plan.  The patient gave consent for this Medical Advice Message and is aware that it may result in a bill to their insurance company as well as the possibility that this may result in a co-payment or deductible. They are an established patient, but are not seeking medical advice exclusively about a problem treated during an in person or video visit in the last 7 days. I did not recommend an in person or video visit within 7 days of my reply.  I spent a total of 12 minutes cumulative time within 7 days through [place] messaging Lamar Blinks, MD

## 2022-05-17 NOTE — Telephone Encounter (Signed)
Tried calling to give the pt these instructions, vmb was full. Will try again in a bit.

## 2022-05-17 NOTE — Telephone Encounter (Signed)
Pt was seen on 01/12/22 by LM for sinus issues. She declines OV. Please advise.

## 2022-05-17 NOTE — Telephone Encounter (Signed)
Pt called to see if Dr. Lorelei Pont would send prescription in for her because she is having sinus problems. She said her nose is stopped up and sometimes run, she has a little cough and sinus pressure. Pt said she received antibiotic a few months ago. Pt was advise she may need an appt since it's been a few months since she was last in for same issue. Pt declind visit.

## 2022-05-17 NOTE — Telephone Encounter (Signed)
Pt contacted Dr Lorelei Pont Via MyChart.

## 2022-05-17 NOTE — Addendum Note (Signed)
Addended by: [name] on: 05/17/2022 04:36 PM   Modules accepted: Orders

## 2022-06-26 NOTE — Progress Notes (Unsigned)
[facility] [address], [address] [place], [place] [address] [phone]  Date:  06/28/2022   Name:  Maria [name]   DOB:  1950/12/11   MRN:  [id number]  PCP:  Pearline Cables, MD    Chief Complaint: No chief complaint on file.   History of Present Illness:  Maria [name] is a 72 y.o. very pleasant female patient who presents with the following:  Patient seen today for physical exam Most recent visit with myself was in August of last year History of dyslipidemia, obesity, hypertension, osteoporosis, prediabetes, GERD  Omeprazole Crestor-note patient is not taking  Shingrix recommended Suggest COVID-19 booster Mammogram is up-to-date Colonoscopy is up-to-date Recommend DEXA scan She had a moderate score on CT coronary calcium last December: Narrative & Impression  CLINICAL DATA:  Cardiovascular Disease Risk stratification  IMPRESSION: Coronary calcium score of 21.5. This was 55 percentile for age-, race-, and sex-matched controls.     Some labs done last November, CMP, lipid, thyroid Patient Active Problem List   Diagnosis Date Noted   Atherosclerosis of aorta 02/17/2022   Mixed dyslipidemia 06/30/2021   Obesity (BMI 30.0-34.9) 06/30/2021   Cardiac murmur 06/30/2021   Arthritis 06/24/2021   Colon polyps 06/24/2021   Diverticulosis 06/24/2021   Hemorrhoids, external without complications 06/24/2021   Hemorrhoids, internal 06/24/2021   Hypertension 06/24/2021   Osteoporosis 06/24/2021   Tubular adenoma of colon 06/24/2021   GERD (gastroesophageal reflux disease) 12/07/2016   Prediabetes 10/13/2015   Gastro-esophageal reflux disease with esophagitis 11/20/2014   HH (hiatus hernia) 2014   NONSPECIFIC ABN FINDING RAD & OTH EXAM GI TRACT 09/19/2009    Past Medical History:  Diagnosis Date   Arthritis    Cardiac murmur 06/30/2021   Colon polyps    Diverticulosis    Gastro-esophageal reflux disease with  esophagitis 11/20/2014   Endoscopy 10/2014 per DR. Hung   GERD (gastroesophageal reflux disease)    Hemorrhoids, external without complications    Hemorrhoids, internal    HH (hiatus hernia) 2014   HH (hiatus hernia) 2014   Hypertension    Mixed dyslipidemia 06/30/2021   NONSPECIFIC ABN FINDING RAD & OTH EXAM GI TRACT 09/19/2009   Qualifier: Diagnosis of  By: Christella Hartigan MD, Melton Alar    Obesity (BMI 30.0-34.9) 06/30/2021   Osteoporosis    pre   Prediabetes 10/13/2015   Tubular adenoma of colon     Past Surgical History:  Procedure Laterality Date   COLONOSCOPY     [id number]   ESOPHAGEAL MANOMETRY N/A 12/13/2014   Procedure: ESOPHAGEAL MANOMETRY (EM);  Surgeon: Jeani Hawking, MD;  Location: [facility];  Service: Endoscopy;  Laterality: N/A;   POLYPECTOMY     SPINE SURGERY     Neck   UPPER GASTROINTESTINAL ENDOSCOPY      Social History   Tobacco Use   Smoking status: Never   Smokeless tobacco: Never  Vaping Use   Vaping Use: Never used  Substance Use Topics   Alcohol use: No    Alcohol/week: 0.0 standard drinks of alcohol   Drug use: No    Family History  Problem Relation Age of Onset   Hypertension Father    Arthritis Brother    Colon cancer Neg Hx    Rectal cancer Neg Hx    Esophageal cancer Neg Hx    Stomach cancer Neg Hx    Pancreatic cancer Neg Hx    Prostate cancer Neg Hx  Colon polyps Neg Hx     No Known Allergies  Medication list has been reviewed and updated.  Current Outpatient Medications on File Prior to Visit  Medication Sig Dispense Refill   acetaminophen (TYLENOL) 325 MG tablet Take 650 mg by mouth every 6 (six) hours as needed for pain or headache.     amoxicillin (AMOXIL) 500 MG capsule Take 2 capsules (1,000 mg total) by mouth 2 (two) times daily. 40 capsule 0   b complex vitamins tablet Take 1 tablet by mouth daily.     celecoxib (CELEBREX) 100 MG capsule Take 1 capsule (100 mg total) by mouth 2 (two) times daily. Use as needed for  arthritis pain (Patient not taking: Reported on 03/30/2022) 60 capsule 1   Cholecalciferol (VITAMIN D3) 1.25 MG (50000 UT) CAPS Take 1 weekly for 12 weeks 12 capsule 0   Multiple Vitamin (MULTIVITAMIN) tablet Take 1 tablet by mouth daily. (Patient not taking: Reported on 03/30/2022)     omeprazole (PRILOSEC) 20 MG capsule Take 1 capsule (20 mg total) by mouth daily. 90 capsule 3   Peppermint Oil (IBGARD) 90 MG CPCR Take as directed 12 capsule 0   polyethylene glycol (MIRALAX) 17 g packet Take 17 g by mouth daily. Titrate up as needed to achieve 1 bowel movement daily 14 each 0   rosuvastatin (CRESTOR) 10 MG tablet Take 1 tablet (10 mg total) by mouth daily. (Patient not taking: Reported on 03/30/2022) 90 tablet 3   No current facility-administered medications on file prior to visit.    Review of Systems:  As per HPI- otherwise negative.   Physical Examination: There were no vitals filed for this visit. There were no vitals filed for this visit. There is no height or weight on file to calculate BMI. Ideal Body Weight:    GEN: no acute distress. HEENT: Atraumatic, Normocephalic.  Ears and Nose: No external deformity. CV: RRR, No M/G/R. No JVD. No thrill. No extra heart sounds. PULM: CTA B, no wheezes, crackles, rhonchi. No retractions. No resp. distress. No accessory muscle use. ABD: S, NT, ND, +BS. No rebound. No HSM. EXTR: No c/c/e PSYCH: Normally interactive. Conversant.    Assessment and Plan: ***  Physical exam today, encouraged healthy diet and exercise routine Will plan further follow- up pending labs.  Signed Abbe Amsterdam, MD

## 2022-06-26 NOTE — Patient Instructions (Incomplete)
It was great to see you again today- I will be in touch with your labs asap I will get you set up with an orthopedist to look at your shoulders in further detail I would recommend getting the shingles vaccine series- shingrix- at your pharmacy Please stop by imaging on the ground floor to set up your bone density Please do be sure you are taking your rosuvastatin/ crestor for your cholesterol!

## 2022-06-28 ENCOUNTER — Encounter: Payer: Self-pay | Admitting: Family Medicine

## 2022-06-28 ENCOUNTER — Ambulatory Visit (INDEPENDENT_AMBULATORY_CARE_PROVIDER_SITE_OTHER): Payer: Medicare Other | Admitting: Family Medicine

## 2022-06-28 VITALS — BP 116/60 | HR 60 | Temp 97.9°F | Resp 18 | Ht 66.0 in | Wt 179.0 lb

## 2022-06-28 DIAGNOSIS — E785 Hyperlipidemia, unspecified: Secondary | ICD-10-CM

## 2022-06-28 DIAGNOSIS — R7303 Prediabetes: Secondary | ICD-10-CM | POA: Diagnosis not present

## 2022-06-28 DIAGNOSIS — M159 Polyosteoarthritis, unspecified: Secondary | ICD-10-CM

## 2022-06-28 DIAGNOSIS — E559 Vitamin D deficiency, unspecified: Secondary | ICD-10-CM

## 2022-06-28 DIAGNOSIS — G8929 Other chronic pain: Secondary | ICD-10-CM

## 2022-06-28 DIAGNOSIS — Z0000 Encounter for general adult medical examination without abnormal findings: Secondary | ICD-10-CM | POA: Diagnosis not present

## 2022-06-28 DIAGNOSIS — E538 Deficiency of other specified B group vitamins: Secondary | ICD-10-CM | POA: Diagnosis not present

## 2022-06-28 DIAGNOSIS — M25511 Pain in right shoulder: Secondary | ICD-10-CM

## 2022-06-28 DIAGNOSIS — M810 Age-related osteoporosis without current pathological fracture: Secondary | ICD-10-CM

## 2022-06-28 DIAGNOSIS — I10 Essential (primary) hypertension: Secondary | ICD-10-CM | POA: Diagnosis not present

## 2022-06-28 LAB — HEMOGLOBIN A1C: Hgb A1c MFr Bld: 6.1 % (ref 4.6–6.5)

## 2022-06-28 LAB — CBC
HCT: 38.5 % (ref 36.0–46.0)
Hemoglobin: 12.7 g/dL (ref 12.0–15.0)
MCHC: 33.1 g/dL (ref 30.0–36.0)
MCV: 91.6 fl (ref 78.0–100.0)
Platelets: 291 10*3/uL (ref 150.0–400.0)
RBC: 4.2 Mil/uL (ref 3.87–5.11)
RDW: 13.3 % (ref 11.5–15.5)
WBC: 8.8 10*3/uL (ref 4.0–10.5)

## 2022-06-28 LAB — VITAMIN D 25 HYDROXY (VIT D DEFICIENCY, FRACTURES): VITD: 20.49 ng/mL — ABNORMAL LOW (ref 30.00–100.00)

## 2022-06-28 LAB — VITAMIN B12: Vitamin B-12: 352 pg/mL (ref 211–911)

## 2022-06-28 MED ORDER — VITAMIN D3 1.25 MG (50000 UT) PO CAPS
ORAL_CAPSULE | ORAL | 0 refills | Status: DC
Start: 2022-06-28 — End: 2023-07-18

## 2022-06-28 NOTE — Addendum Note (Signed)
Addended by: Abbe Amsterdam C on: 06/28/2022 05:52 PM   Modules accepted: Orders

## 2022-07-02 ENCOUNTER — Other Ambulatory Visit (INDEPENDENT_AMBULATORY_CARE_PROVIDER_SITE_OTHER): Payer: Medicare Other

## 2022-07-02 ENCOUNTER — Encounter: Payer: Self-pay | Admitting: Orthopaedic Surgery

## 2022-07-02 ENCOUNTER — Ambulatory Visit: Payer: Medicare Other | Admitting: Orthopaedic Surgery

## 2022-07-02 DIAGNOSIS — M25512 Pain in left shoulder: Secondary | ICD-10-CM

## 2022-07-02 DIAGNOSIS — M25511 Pain in right shoulder: Secondary | ICD-10-CM

## 2022-07-02 DIAGNOSIS — G8929 Other chronic pain: Secondary | ICD-10-CM

## 2022-07-02 MED ORDER — NAPROXEN 500 MG PO TABS: 500.0000 mg | ORAL_TABLET | Freq: Two times a day (BID) | ORAL | 3 refills | Status: AC

## 2022-07-02 NOTE — Progress Notes (Signed)
Office Visit Note   Patient: Maria [name]           Date of Birth: [birth year]           MRN: [id number] Visit Date: 07/02/2022              Requested by: Pearline Cables, MD [address] [place],  [place] [address] PCP: Pearline Cables, MD   Assessment & Plan: Visit Diagnoses:  1. Chronic pain of both shoulders     Plan: Impression is 72 year old female with chronic right shoulder pain.  This is likely due to overuse of the rotator cuff or bursitis.  Treatment options were explained.  She would like to try prescription for NSAID.  Have sent in prescription for naproxen.  Activity modification as needed.  She will hold off on cortisone injection for now.  Follow-Up Instructions: No follow-ups on file.   Orders:  Orders Placed This Encounter  Procedures   XR Shoulder Left   XR Shoulder Right   Meds ordered this encounter  Medications   naproxen (NAPROSYN) 500 MG tablet    Sig: Take 1 tablet (500 mg total) by mouth 2 (two) times daily with a meal.    Dispense:  30 tablet    Refill:  3      Procedures: No procedures performed   Clinical Data: No additional findings.   Subjective: Chief Complaint  Patient presents with   Right Shoulder - Pain   Left Shoulder - Pain    HPI  Patient is a very pleasant 72 year old female here for bilateral shoulder pain worse on the right for months.  Denies any injuries.  Feels like it gets tight.  She is pretty active individual and still works.  She is right-hand dominant.  Feels pain to the lateral deltoid.  Denies any radicular symptoms.  Pain is worse with activity and lifting.  Review of Systems  Constitutional: Negative.   HENT: Negative.    Eyes: Negative.   Respiratory: Negative.    Cardiovascular: Negative.   Endocrine: Negative.   Musculoskeletal: Negative.   Neurological: Negative.   Hematological: Negative.   Psychiatric/Behavioral: Negative.    All other systems reviewed and are  negative.    Objective: Vital Signs: There were no vitals taken for this visit.  Physical Exam Vitals and nursing note reviewed.  Constitutional:      Appearance: She is well-developed.  HENT:     Head: Atraumatic.     Nose: Nose normal.  Eyes:     Extraocular Movements: Extraocular movements intact.  Cardiovascular:     Pulses: Normal pulses.  Pulmonary:     Effort: Pulmonary effort is normal.  Abdominal:     Palpations: Abdomen is soft.  Musculoskeletal:     Cervical back: Neck supple.  Skin:    General: Skin is warm.     Capillary Refill: Capillary refill takes less than 2 seconds.  Neurological:     Mental Status: She is alert. Mental status is at baseline.  Psychiatric:        Behavior: Behavior normal.        Thought Content: Thought content normal.        Judgment: Judgment normal.     Ortho Exam  Examination right shoulder shows full range of motion.  She has good strength to manual muscle testing of the rotator cuff.  Pain with Hawkins sign.  Specialty Comments:  No specialty comments available.  Imaging: XR  Shoulder Right  Result Date: 07/02/2022 Mild glenohumeral and acromioclavicular arthritis.  No acute abnormalities.  XR Shoulder Left  Result Date: 07/02/2022 Mild arthritic changes of the glenohumeral and acromioclavicular joints.    PMFS History: Patient Active Problem List   Diagnosis Date Noted   Atherosclerosis of aorta 02/17/2022   Mixed dyslipidemia 06/30/2021   Obesity (BMI 30.0-34.9) 06/30/2021   Cardiac murmur 06/30/2021   Arthritis 06/24/2021   Colon polyps 06/24/2021   Diverticulosis 06/24/2021   Hemorrhoids, external without complications 06/24/2021   Hemorrhoids, internal 06/24/2021   Hypertension 06/24/2021   Osteoporosis 06/24/2021   Tubular adenoma of colon 06/24/2021   GERD (gastroesophageal reflux disease) 12/07/2016   Prediabetes 10/13/2015   Gastro-esophageal reflux disease with esophagitis 11/20/2014   HH  (hiatus hernia) 2014   NONSPECIFIC ABN FINDING RAD & OTH EXAM GI TRACT 09/19/2009   Past Medical History:  Diagnosis Date   Arthritis    Cardiac murmur 06/30/2021   Colon polyps    Diverticulosis    Gastro-esophageal reflux disease with esophagitis 11/20/2014   Endoscopy 10/2014 per DR. Hung   GERD (gastroesophageal reflux disease)    Hemorrhoids, external without complications    Hemorrhoids, internal    HH (hiatus hernia) 2014   HH (hiatus hernia) 2014   Hypertension    Mixed dyslipidemia 06/30/2021   NONSPECIFIC ABN FINDING RAD & OTH EXAM GI TRACT 09/19/2009   Qualifier: Diagnosis of  By: Christella Hartigan MD, Melton Alar    Obesity (BMI 30.0-34.9) 06/30/2021   Osteoporosis    pre   Prediabetes 10/13/2015   Tubular adenoma of colon     Family History  Problem Relation Age of Onset   Hypertension Father    Arthritis Brother    Colon cancer Neg Hx    Rectal cancer Neg Hx    Esophageal cancer Neg Hx    Stomach cancer Neg Hx    Pancreatic cancer Neg Hx    Prostate cancer Neg Hx    Colon polyps Neg Hx     Past Surgical History:  Procedure Laterality Date   COLONOSCOPY     [id number]   ESOPHAGEAL MANOMETRY N/A 12/13/2014   Procedure: ESOPHAGEAL MANOMETRY (EM);  Surgeon: Jeani Hawking, MD;  Location: [facility];  Service: Endoscopy;  Laterality: N/A;   POLYPECTOMY     SPINE SURGERY     Neck   UPPER GASTROINTESTINAL ENDOSCOPY     Social History   Occupational History   Occupation: Retired  Tobacco Use   Smoking status: Never   Smokeless tobacco: Never  Vaping Use   Vaping Use: Never used  Substance and Sexual Activity   Alcohol use: No    Alcohol/week: 0.0 standard drinks of alcohol   Drug use: No   Sexual activity: Never

## 2022-07-05 ENCOUNTER — Ambulatory Visit (HOSPITAL_BASED_OUTPATIENT_CLINIC_OR_DEPARTMENT_OTHER)
Admission: RE | Admit: 2022-07-05 | Discharge: 2022-07-05 | Disposition: A | Discharge status: Home / Self Care | Payer: Medicare Other | Source: Ambulatory Visit | Attending: Family Medicine | Admitting: Family Medicine

## 2022-07-05 ENCOUNTER — Encounter: Payer: Self-pay | Admitting: Family Medicine

## 2022-07-05 DIAGNOSIS — G8929 Other chronic pain: Secondary | ICD-10-CM

## 2022-07-05 DIAGNOSIS — M810 Age-related osteoporosis without current pathological fracture: Secondary | ICD-10-CM | POA: Diagnosis not present

## 2022-07-05 DIAGNOSIS — M85852 Other specified disorders of bone density and structure, left thigh: Secondary | ICD-10-CM | POA: Diagnosis not present

## 2022-07-29 DIAGNOSIS — M9903 Segmental and somatic dysfunction of lumbar region: Secondary | ICD-10-CM | POA: Diagnosis not present

## 2022-07-29 DIAGNOSIS — M9901 Segmental and somatic dysfunction of cervical region: Secondary | ICD-10-CM | POA: Diagnosis not present

## 2022-07-29 DIAGNOSIS — M25511 Pain in right shoulder: Secondary | ICD-10-CM | POA: Diagnosis not present

## 2022-07-29 DIAGNOSIS — M9902 Segmental and somatic dysfunction of thoracic region: Secondary | ICD-10-CM | POA: Diagnosis not present

## 2022-08-09 DIAGNOSIS — M9903 Segmental and somatic dysfunction of lumbar region: Secondary | ICD-10-CM | POA: Diagnosis not present

## 2022-08-09 DIAGNOSIS — M9901 Segmental and somatic dysfunction of cervical region: Secondary | ICD-10-CM | POA: Diagnosis not present

## 2022-08-09 DIAGNOSIS — M9902 Segmental and somatic dysfunction of thoracic region: Secondary | ICD-10-CM | POA: Diagnosis not present

## 2022-08-11 DIAGNOSIS — M9903 Segmental and somatic dysfunction of lumbar region: Secondary | ICD-10-CM | POA: Diagnosis not present

## 2022-08-11 DIAGNOSIS — M9901 Segmental and somatic dysfunction of cervical region: Secondary | ICD-10-CM | POA: Diagnosis not present

## 2022-08-11 DIAGNOSIS — M9902 Segmental and somatic dysfunction of thoracic region: Secondary | ICD-10-CM | POA: Diagnosis not present

## 2022-08-11 DIAGNOSIS — M9907 Segmental and somatic dysfunction of upper extremity: Secondary | ICD-10-CM | POA: Diagnosis not present

## 2022-08-14 NOTE — Addendum Note (Signed)
Addended by: Pearline Cables on: 08/14/2022 08:34 AM   Modules accepted: Orders

## 2022-08-17 DIAGNOSIS — M9901 Segmental and somatic dysfunction of cervical region: Secondary | ICD-10-CM | POA: Diagnosis not present

## 2022-08-17 DIAGNOSIS — M9903 Segmental and somatic dysfunction of lumbar region: Secondary | ICD-10-CM | POA: Diagnosis not present

## 2022-08-17 DIAGNOSIS — M9902 Segmental and somatic dysfunction of thoracic region: Secondary | ICD-10-CM | POA: Diagnosis not present

## 2022-08-19 DIAGNOSIS — M9902 Segmental and somatic dysfunction of thoracic region: Secondary | ICD-10-CM | POA: Diagnosis not present

## 2022-08-19 DIAGNOSIS — M9901 Segmental and somatic dysfunction of cervical region: Secondary | ICD-10-CM | POA: Diagnosis not present

## 2022-08-19 DIAGNOSIS — M9903 Segmental and somatic dysfunction of lumbar region: Secondary | ICD-10-CM | POA: Diagnosis not present

## 2022-08-24 DIAGNOSIS — M9907 Segmental and somatic dysfunction of upper extremity: Secondary | ICD-10-CM | POA: Diagnosis not present

## 2022-08-24 DIAGNOSIS — M9902 Segmental and somatic dysfunction of thoracic region: Secondary | ICD-10-CM | POA: Diagnosis not present

## 2022-08-24 DIAGNOSIS — M9901 Segmental and somatic dysfunction of cervical region: Secondary | ICD-10-CM | POA: Diagnosis not present

## 2022-08-24 DIAGNOSIS — M9903 Segmental and somatic dysfunction of lumbar region: Secondary | ICD-10-CM | POA: Diagnosis not present

## 2022-08-26 DIAGNOSIS — M9902 Segmental and somatic dysfunction of thoracic region: Secondary | ICD-10-CM | POA: Diagnosis not present

## 2022-08-26 DIAGNOSIS — M9903 Segmental and somatic dysfunction of lumbar region: Secondary | ICD-10-CM | POA: Diagnosis not present

## 2022-08-26 DIAGNOSIS — M9901 Segmental and somatic dysfunction of cervical region: Secondary | ICD-10-CM | POA: Diagnosis not present

## 2022-09-07 DIAGNOSIS — M9903 Segmental and somatic dysfunction of lumbar region: Secondary | ICD-10-CM | POA: Diagnosis not present

## 2022-09-07 DIAGNOSIS — M9901 Segmental and somatic dysfunction of cervical region: Secondary | ICD-10-CM | POA: Diagnosis not present

## 2022-09-07 DIAGNOSIS — M9902 Segmental and somatic dysfunction of thoracic region: Secondary | ICD-10-CM | POA: Diagnosis not present

## 2022-09-09 DIAGNOSIS — M9903 Segmental and somatic dysfunction of lumbar region: Secondary | ICD-10-CM | POA: Diagnosis not present

## 2022-09-09 DIAGNOSIS — M9901 Segmental and somatic dysfunction of cervical region: Secondary | ICD-10-CM | POA: Diagnosis not present

## 2022-09-09 DIAGNOSIS — M9902 Segmental and somatic dysfunction of thoracic region: Secondary | ICD-10-CM | POA: Diagnosis not present

## 2022-09-14 DIAGNOSIS — M9903 Segmental and somatic dysfunction of lumbar region: Secondary | ICD-10-CM | POA: Diagnosis not present

## 2022-09-14 DIAGNOSIS — M9902 Segmental and somatic dysfunction of thoracic region: Secondary | ICD-10-CM | POA: Diagnosis not present

## 2022-09-14 DIAGNOSIS — M9901 Segmental and somatic dysfunction of cervical region: Secondary | ICD-10-CM | POA: Diagnosis not present

## 2022-09-16 DIAGNOSIS — M9902 Segmental and somatic dysfunction of thoracic region: Secondary | ICD-10-CM | POA: Diagnosis not present

## 2022-09-16 DIAGNOSIS — M9903 Segmental and somatic dysfunction of lumbar region: Secondary | ICD-10-CM | POA: Diagnosis not present

## 2022-09-16 DIAGNOSIS — M9901 Segmental and somatic dysfunction of cervical region: Secondary | ICD-10-CM | POA: Diagnosis not present

## 2022-09-18 ENCOUNTER — Other Ambulatory Visit: Payer: Self-pay | Admitting: Family Medicine

## 2022-09-18 DIAGNOSIS — E559 Vitamin D deficiency, unspecified: Secondary | ICD-10-CM

## 2022-09-22 DIAGNOSIS — M9903 Segmental and somatic dysfunction of lumbar region: Secondary | ICD-10-CM | POA: Diagnosis not present

## 2022-09-22 DIAGNOSIS — M9901 Segmental and somatic dysfunction of cervical region: Secondary | ICD-10-CM | POA: Diagnosis not present

## 2022-09-22 DIAGNOSIS — M9902 Segmental and somatic dysfunction of thoracic region: Secondary | ICD-10-CM | POA: Diagnosis not present

## 2022-10-08 ENCOUNTER — Encounter: Payer: Self-pay | Admitting: Obstetrics and Gynecology

## 2022-10-08 ENCOUNTER — Ambulatory Visit (INDEPENDENT_AMBULATORY_CARE_PROVIDER_SITE_OTHER): Payer: Medicare Other | Admitting: Obstetrics and Gynecology

## 2022-10-08 VITALS — BP 129/61 | HR 62 | Ht 66.0 in | Wt 186.0 lb

## 2022-10-08 DIAGNOSIS — Z1339 Encounter for screening examination for other mental health and behavioral disorders: Secondary | ICD-10-CM | POA: Diagnosis not present

## 2022-10-08 DIAGNOSIS — Z01419 Encounter for gynecological examination (general) (routine) without abnormal findings: Secondary | ICD-10-CM

## 2022-10-08 NOTE — Progress Notes (Signed)
ANNUAL EXAM Patient name: Maria [name] MRN [id number]  Date of birth: May 16, 1950 Chief Complaint:   Gynecologic Exam  History of Present Illness:   Maria [name] is a 72 y.o. [id number] being seen today for a routine annual exam.  Current complaints: none  Menstrual concerns? No  postmenopausal several years without PMB  Breast or nipple changes? No  Contraception use? No  Sexually active? No widowed, no masturbation  No LMP recorded. Patient is postmenopausal.   Last pap No results found for: "DIAGPAP", "HPVHIGH", "ADEQPAP" Last mammogram: 07/2021 BIRADS 1 Last colonoscopy: 2021.      10/08/2022   10:37 AM 01/12/2022    2:24 PM 10/06/2021    2:40 PM 05/27/2021   10:33 AM 03/26/2021    1:15 PM  Depression screen PHQ 2/9  Decreased Interest 0 0 0 0 0  Down, Depressed, Hopeless 0 0 0 0 0  PHQ - 2 Score 0 0 0 0 0  Altered sleeping 0 0     Tired, decreased energy 0 0     Change in appetite 0 0     Feeling bad or failure about yourself  0 0     Trouble concentrating 0 0     Moving slowly or fidgety/restless 0 0     Suicidal thoughts 0 0     PHQ-9 Score 0 0     Difficult doing work/chores  Not difficult at all           10/08/2022   10:38 AM  GAD 7 : Generalized Anxiety Score  Nervous, Anxious, on Edge 0  Control/stop worrying 0  Worry too much - different things 0  Trouble relaxing 0  Restless 0  Easily annoyed or irritable 0  Afraid - awful might happen 0  Total GAD 7 Score 0     Review of Systems:   Pertinent items are noted in HPI Denies any headaches, blurred vision, fatigue, shortness of breath, chest pain, abdominal pain, abnormal vaginal discharge/itching/odor/irritation, problems with periods, bowel movements, urination, or intercourse unless otherwise stated above. Pertinent History Reviewed:  Reviewed past medical,surgical, social and family history.  Reviewed problem list, medications and allergies. Physical Assessment:   Vitals:   10/08/22 1016   BP: 129/61  Pulse: 62  Weight: 186 lb (84.4 kg)  Height: 5\' 6"  (1.676 m)  Body mass index is 30.02 kg/m.        Physical Examination:   General appearance - well appearing, and in no distress  Mental status - alert, oriented to person, place, and time  Psych:  She has a normal mood and affect  Skin - warm and dry, normal color, no suspicious lesions noted  Chest - effort normal,  Breasts - breasts appear normal, no suspicious masses, no skin or nipple changes or  axillary nodes  Abdomen - soft, nontender, nondistended, no masses or organomegaly  Pelvic -  VULVA: normal appearing vulva with no masses, tenderness or lesions   VAGINA: normal appearing vagina with normal color and discharge, no lesions   CERVIX: normal appearing cervix without discharge or lesions, no CMT  Thin prep pap is not done  UTERUS: uterus is felt to be small and mobile  ADNEXA: No adnexal masses or tenderness noted.  Extremities:  No swelling or varicosities noted  Chaperone present for exam  No results found for this or any previous visit (from the past 24 hour(s)).    Assessment & Plan:  1. Well woman exam with  routine gynecological exam - Cervical cancer screening: Discussed guidelines related to screening beyond 65. Based on guidelines, the patient meets criteria for cessation of pap smears.   - Breast Health: Encouraged self-breast awareness/SBE. Discussed limits of clinical breast exam for detecting breast cancer. Discussed importance of annual MXR. Rx given for MXR - Colonoscopy: up to date - F/U 12 months and prn   Orders Placed This Encounter  Procedures   MM 3D SCREENING MAMMOGRAM BILATERAL BREAST    Meds: No orders of the defined types were placed in this encounter.   Follow-up: Return for Annual GYN.  Lorriane Shire, MD 10/08/2022 10:43 AM

## 2022-10-12 DIAGNOSIS — M9902 Segmental and somatic dysfunction of thoracic region: Secondary | ICD-10-CM | POA: Diagnosis not present

## 2022-10-12 DIAGNOSIS — M9903 Segmental and somatic dysfunction of lumbar region: Secondary | ICD-10-CM | POA: Diagnosis not present

## 2022-10-12 DIAGNOSIS — M9901 Segmental and somatic dysfunction of cervical region: Secondary | ICD-10-CM | POA: Diagnosis not present

## 2022-10-12 DIAGNOSIS — M9907 Segmental and somatic dysfunction of upper extremity: Secondary | ICD-10-CM | POA: Diagnosis not present

## 2022-10-12 DIAGNOSIS — M25511 Pain in right shoulder: Secondary | ICD-10-CM | POA: Diagnosis not present

## 2022-10-14 DIAGNOSIS — M9902 Segmental and somatic dysfunction of thoracic region: Secondary | ICD-10-CM | POA: Diagnosis not present

## 2022-10-14 DIAGNOSIS — M9901 Segmental and somatic dysfunction of cervical region: Secondary | ICD-10-CM | POA: Diagnosis not present

## 2022-10-14 DIAGNOSIS — M9903 Segmental and somatic dysfunction of lumbar region: Secondary | ICD-10-CM | POA: Diagnosis not present

## 2022-10-14 DIAGNOSIS — M25511 Pain in right shoulder: Secondary | ICD-10-CM | POA: Diagnosis not present

## 2022-10-18 ENCOUNTER — Ambulatory Visit (HOSPITAL_BASED_OUTPATIENT_CLINIC_OR_DEPARTMENT_OTHER)
Admission: RE | Admit: 2022-10-18 | Discharge: 2022-10-18 | Disposition: A | Discharge status: Home / Self Care | Payer: Medicare Other | Source: Ambulatory Visit | Attending: Obstetrics and Gynecology | Admitting: Obstetrics and Gynecology

## 2022-10-18 ENCOUNTER — Encounter (HOSPITAL_BASED_OUTPATIENT_CLINIC_OR_DEPARTMENT_OTHER): Payer: Self-pay

## 2022-10-18 DIAGNOSIS — Z01419 Encounter for gynecological examination (general) (routine) without abnormal findings: Secondary | ICD-10-CM | POA: Diagnosis present

## 2022-10-18 DIAGNOSIS — Z1231 Encounter for screening mammogram for malignant neoplasm of breast: Secondary | ICD-10-CM | POA: Diagnosis not present

## 2022-10-19 ENCOUNTER — Ambulatory Visit (INDEPENDENT_AMBULATORY_CARE_PROVIDER_SITE_OTHER): Payer: Medicare Other | Admitting: Emergency Medicine

## 2022-10-19 VITALS — Ht 66.0 in | Wt 186.0 lb

## 2022-10-19 DIAGNOSIS — Z0000 Encounter for general adult medical examination without abnormal findings: Secondary | ICD-10-CM

## 2022-10-19 NOTE — Progress Notes (Signed)
Subjective:   Maria [name] is a 72 y.o. female who presents for Medicare Annual (Subsequent) preventive examination.  Visit Complete: Virtual  I connected with  Maria [name] on 10/19/22 by a audio enabled telemedicine application and verified that I am speaking with the correct person using two identifiers.  Patient Location: Home  Provider Location: Home Office  I discussed the limitations of evaluation and management by telemedicine. The patient expressed understanding and agreed to proceed.  Vital Signs: Unable to obtain new vitals due to this being a telehealth visit.   Review of Systems     Cardiac Risk Factors include: advanced age (>39men, >14 women);hypertension;obesity (BMI >30kg/m2);dyslipidemia;Other (see comment), Risk factor comments: Atherosclerosis of aorta     Objective:    Today's Vitals   10/19/22 1430  Weight: 186 lb (84.4 kg)  Height: 5\' 6"  (1.676 m)   Body mass index is 30.02 kg/m.     10/19/2022    2:44 PM 10/06/2021    2:42 PM 04/13/2019   11:31 AM 11/14/2016    9:47 AM 10/01/2016    4:57 PM 05/24/2016   11:19 AM  Advanced Directives  Does Patient Have a Medical Advance Directive? No No Yes No No Yes  Type of Advance Directive   Living will   Healthcare Power of [place];Living will  Does patient want to make changes to medical advance directive? No - Patient declined  No - Patient declined     Copy of Healthcare Power of Attorney in Chart?      No - copy requested  Would patient like information on creating a medical advance directive? Yes (MAU/Ambulatory/Procedural Areas - Information given) No - Patient declined   No - Patient declined     Current Medications (verified) Outpatient Encounter Medications as of 10/19/2022  Medication Sig   acetaminophen (TYLENOL) 325 MG tablet Take 650 mg by mouth every 6 (six) hours as needed for pain or headache.   b complex vitamins tablet Take 1 tablet by mouth daily.   celecoxib (CELEBREX) 100 MG capsule  Take 1 capsule (100 mg total) by mouth 2 (two) times daily. Use as needed for arthritis pain   Cholecalciferol (VITAMIN D3) 1.25 MG (50000 UT) CAPS Take 1 weekly for 12 weeks   Multiple Vitamin (MULTIVITAMIN) tablet Take 1 tablet by mouth daily.   naproxen (NAPROSYN) 500 MG tablet Take 1 tablet (500 mg total) by mouth 2 (two) times daily with a meal.   polyethylene glycol (MIRALAX) 17 g packet Take 17 g by mouth daily. Titrate up as needed to achieve 1 bowel movement daily   rosuvastatin (CRESTOR) 10 MG tablet Take 1 tablet (10 mg total) by mouth daily.   Cholecalciferol (VITAMIN D3) 1.25 MG (50000 UT) capsule Take 1 weekly for 12 weeks (Patient not taking: Reported on 10/19/2022)   omeprazole (PRILOSEC) 20 MG capsule Take 1 capsule (20 mg total) by mouth daily. (Patient not taking: Reported on 10/19/2022)   Peppermint Oil (IBGARD) 90 MG CPCR Take as directed (Patient not taking: Reported on 10/19/2022)   No facility-administered encounter medications on file as of 10/19/2022.    Allergies (verified) Patient has no known allergies.   History: Past Medical History:  Diagnosis Date   Arthritis    Cardiac murmur 06/30/2021   Colon polyps    Diverticulosis    Gastro-esophageal reflux disease with esophagitis 11/20/2014   Endoscopy 10/2014 per DR. Hung   GERD (gastroesophageal reflux disease)    Hemorrhoids, external without complications  Hemorrhoids, internal    HH (hiatus hernia) 2014   HH (hiatus hernia) 2014   Hypertension    Mixed dyslipidemia 06/30/2021   NONSPECIFIC ABN FINDING RAD & OTH EXAM GI TRACT 09/19/2009   Qualifier: Diagnosis of  By: Christella Hartigan MD, Melton Alar    Obesity (BMI 30.0-34.9) 06/30/2021   Osteoporosis    pre   Prediabetes 10/13/2015   Tubular adenoma of colon    Past Surgical History:  Procedure Laterality Date   COLONOSCOPY     [id number]   ESOPHAGEAL MANOMETRY N/A 12/13/2014   Procedure: ESOPHAGEAL MANOMETRY (EM);  Surgeon: Jeani Hawking, MD;  Location: [facility]  ENDOSCOPY;  Service: Endoscopy;  Laterality: N/A;   POLYPECTOMY     SPINE SURGERY     Neck   UPPER GASTROINTESTINAL ENDOSCOPY     Family History  Problem Relation Age of Onset   Other Mother        smoke inhalation   Hypertension Father    Arthritis Brother    Colon cancer Neg Hx    Rectal cancer Neg Hx    Esophageal cancer Neg Hx    Stomach cancer Neg Hx    Pancreatic cancer Neg Hx    Prostate cancer Neg Hx    Colon polyps Neg Hx    Social History   Socioeconomic History   Marital status: Widowed    Spouse name: Not on file   Number of children: 3   Years of education: 22   Highest education level: Not on file  Occupational History   Occupation: Retired  Tobacco Use   Smoking status: Never   Smokeless tobacco: Never  Vaping Use   Vaping status: Never Used  Substance and Sexual Activity   Alcohol use: No    Alcohol/week: 0.0 standard drinks of alcohol   Drug use: No   Sexual activity: Never  Other Topics Concern   Not on file  Social History Narrative   Widowed, 3 daughters and 2 granddaughters. They all live in [place], [place]   Social Determinants of Health   Financial Resource Strain: Low Risk  (10/19/2022)   Overall Financial Resource Strain (CARDIA)    Difficulty of Paying Living Expenses: Not hard at all  Food Insecurity: No Food Insecurity (10/19/2022)   Hunger Vital Sign    Worried About Running Out of Food in the Last Year: Never true    Ran Out of Food in the Last Year: Never true  Transportation Needs: No Transportation Needs (10/19/2022)   PRAPARE - Administrator, Civil Service (Medical): No    Lack of Transportation (Non-Medical): No  Physical Activity: Insufficiently Active (10/19/2022)   Exercise Vital Sign    Days of Exercise per Week: 2 days    Minutes of Exercise per Session: 50 min  Stress: No Stress Concern Present (10/19/2022)   [employer] of Occupational Health - Occupational Stress Questionnaire    Feeling of Stress :  Not at all  Social Connections: Moderately Integrated (10/19/2022)   Social Connection and Isolation Panel [NHANES]    Frequency of Communication with Friends and Family: More than three times a week    Frequency of Social Gatherings with Friends and Family: More than three times a week    Attends Religious Services: More than 4 times per year    Active Member of [employer] or Organizations: Yes    Attends Banker Meetings: More than 4 times per year    Marital Status: Widowed  Tobacco Counseling Counseling given: Not Answered   Clinical Intake:  Pre-visit preparation completed: Yes  Pain : No/denies pain     BMI - recorded: 30.02 Nutritional Status: BMI > 30  Obese Nutritional Risks: None Diabetes: No  How often do you need to have someone help you when you read instructions, pamphlets, or other written materials from your doctor or pharmacy?: 1 - Never  Interpreter Needed?: No  Information entered by :: Tora Kindred, CMA   Activities of Daily Living    10/19/2022    2:36 PM  In your present state of health, do you have any difficulty performing the following activities:  Hearing? 0  Vision? 0  Difficulty concentrating or making decisions? 0  Walking or climbing stairs? 0  Dressing or bathing? 0  Doing errands, shopping? 0  Preparing Food and eating ? N  Using the Toilet? N  In the past six months, have you accidently leaked urine? N  Do you have problems with loss of bowel control? N  Managing your Medications? N  Managing your Finances? N  Housekeeping or managing your Housekeeping? N    Patient Care Team: Copland, Gwenlyn Found, MD as PCP - General (Family Medicine) Mitchel Honour, DO as Consulting Physician (Obstetrics and Gynecology)  Indicate any recent Medical Services you may have received from other than [facility] providers in the past year (date may be approximate).     Assessment:   This is a routine wellness examination for  Maria [name].  Hearing/Vision screen Hearing Screening - Comments:: Denies hearing loss  Dietary issues and exercise activities discussed:     Goals Addressed               This Visit's Progress     Weight (lb) < 170 lb (77.1 kg) (pt-stated)   186 lb (84.4 kg)     Depression Screen    10/19/2022    2:42 PM 10/08/2022   10:37 AM 01/12/2022    2:24 PM 10/06/2021    2:40 PM 05/27/2021   10:33 AM 03/26/2021    1:15 PM 07/02/2020   10:50 AM  PHQ 2/9 Scores  PHQ - 2 Score 0 0 0 0 0 0 0  PHQ- 9 Score 0 0 0        Fall Risk    10/19/2022    2:46 PM 01/12/2022    2:24 PM 10/06/2021    2:46 PM 05/27/2021   10:33 AM 03/26/2021    1:14 PM  Fall Risk   Falls in the past year? 0 0 0 1 1  Number falls in past yr: 0 0 0 1 0  Injury with Fall? 0 0 0 0 0  Risk for fall due to : No Fall Risks  Impaired vision  Impaired balance/gait  Follow up Falls prevention discussed Falls evaluation completed Falls prevention discussed  Falls evaluation completed    MEDICARE RISK AT HOME:  Medicare Risk at Home - 10/19/22 1446     Any stairs in or around the home? No    If so, are there any without handrails? No    Home free of loose throw rugs in walkways, pet beds, electrical cords, etc? Yes    Adequate lighting in your home to reduce risk of falls? Yes    Life alert? No    Use of a cane, walker or w/c? No    Grab bars in the bathroom? Yes    Shower chair or bench in shower? No  Elevated toilet seat or a handicapped toilet? No             TIMED UP AND GO:  Was the test performed?  No    Cognitive Function:    05/24/2016   11:20 AM  MMSE - Mini Mental State Exam  Orientation to time 5  Orientation to Place 5  Registration 3  Attention/ Calculation 5  Recall 2  Language- name 2 objects 2  Language- repeat 1  Language- follow 3 step command 3  Language- read & follow direction 1  Write a sentence 1  Copy design 1  Total score 29        10/19/2022    2:47 PM 10/06/2021    2:47 PM   6CIT Screen  What Year? 0 points 0 points  What month? 0 points 0 points  What time? 0 points 0 points  Count back from 20 0 points 0 points  Months in reverse 0 points 0 points  Repeat phrase 0 points 0 points  Total Score 0 points 0 points    Immunizations Immunization History  Administered Date(s) Administered   Fluad Quad(high Dose 65+) 11/27/2018   Influenza Split 01/17/2013, 12/21/2014   Influenza, High Dose Seasonal PF 12/17/2015, 01/08/2017   Influenza,inj,Quad PF,6+ Mos 12/21/2018   Influenza,inj,quad, With Preservative 12/31/2019   Influenza-Unspecified 12/31/2017   PFIZER(Purple Top)SARS-COV-2 Vaccination 07/20/2019, 08/13/2019, 02/02/2020   Pfizer Covid-19 Vaccine Bivalent Booster 1yrs & up 12/03/2020   Pneumococcal Conjugate-13 02/05/2015, 01/31/2016, 01/24/2020   Pneumococcal Polysaccharide-23 01/31/2017   Td 02/05/2015   Zoster Recombinant(Shingrix) 12/31/2017    TDAP status: Up to date  Flu Vaccine status: Up to date  Pneumococcal vaccine status: Up to date  Covid-19 vaccine status: Declined, Education has been provided regarding the importance of this vaccine but patient still declined. Advised may receive this vaccine at local pharmacy or Health Dept.or vaccine clinic. Aware to provide a copy of the vaccination record if obtained from local pharmacy or Health Dept. Verbalized acceptance and understanding.  Qualifies for Shingles Vaccine? Yes   Zostavax completed No   Shingrix Completed?: Yes  Screening Tests Health Maintenance  Topic Date Due   Zoster Vaccines- Shingrix (2 of 2) 02/25/2018   COVID-19 Vaccine (5 - 2023-24 season) 11/20/2021   INFLUENZA VACCINE  10/21/2022   MAMMOGRAM  07/29/2023   Medicare Annual Wellness (AWV)  10/19/2023   DEXA SCAN  07/04/2024   Colonoscopy  12/06/2024   DTaP/Tdap/Td (2 - Tdap) 02/04/2025   Pneumonia Vaccine 85+ Years old  Completed   Hepatitis C Screening  Completed   HPV VACCINES  Aged Out    Health  Maintenance  Health Maintenance Due  Topic Date Due   Zoster Vaccines- Shingrix (2 of 2) 02/25/2018   COVID-19 Vaccine (5 - 2023-24 season) 11/20/2021    Colorectal cancer screening: Type of screening: Colonoscopy. Completed 12/07/19. Repeat every 5 years  Mammogram status: Completed 10/18/22. Repeat every year  Bone Density status: Completed 07/05/22. Results reflect: Bone density results: OSTEOPENIA. Repeat every 2 years.  Lung Cancer Screening: (Low Dose CT Chest recommended if Age 48-80 years, 20 pack-year currently smoking OR have quit w/in 15years.) does not qualify.   Lung Cancer Screening Referral: n/a  Additional Screening:  Hepatitis C Screening: does qualify; Completed 02/05/15  Vision Screening: Recommended annual ophthalmology exams for early detection of glaucoma and other disorders of the eye.  Dental Screening: Recommended annual dental exams for proper oral hygiene  Community Resource Referral / Chronic Care  Management: CRR required this visit?  No   CCM required this visit?  No     Plan:     I have personally reviewed and noted the following in the patient's chart:   Medical and social history Use of alcohol, tobacco or illicit drugs  Current medications and supplements including opioid prescriptions. Patient is not currently taking opioid prescriptions. Functional ability and status Nutritional status Physical activity Advanced directives List of other physicians Hospitalizations, surgeries, and ER visits in previous 12 months Vitals Screenings to include cognitive, depression, and falls Referrals and appointments  In addition, I have reviewed and discussed with patient certain preventive protocols, quality metrics, and best practice recommendations. A written personalized care plan for preventive services as well as general preventive health recommendations were provided to patient.     Tora Kindred, CMA   10/19/2022   After Visit Summary:  (MyChart) Due to this being a telephonic visit, the after visit summary with patients personalized plan was offered to patient via MyChart   Nurse Notes:  Patient states she has had both Shingrix vaccines, but one was in [place] and does not have a record of it. Patient declines any more Covid vaccines.

## 2022-10-19 NOTE — Patient Instructions (Signed)
Maria [name] , Thank you for taking time to come for your Medicare Wellness Visit. I appreciate your ongoing commitment to your health goals. Please review the following plan we discussed and let me know if I can assist you in the future.   Referrals/Orders/Follow-Ups/Clinician Recommendations: Keep up the good work!  This is a list of the screening recommended for you and due dates:  Health Maintenance  Topic Date Due   Zoster (Shingles) Vaccine (2 of 2) 02/25/2018   COVID-19 Vaccine (5 - 2023-24 season) 11/20/2021   Flu Shot  10/21/2022   Mammogram  07/29/2023   Medicare Annual Wellness Visit  10/19/2023   DEXA scan (bone density measurement)  07/04/2024   Colon Cancer Screening  12/06/2024   DTaP/Tdap/Td vaccine (2 - Tdap) 02/04/2025   Pneumonia Vaccine  Completed   Hepatitis C Screening  Completed   HPV Vaccine  Aged Out    Advanced directives: Information on Advanced Care Planning can be found at [facility] of [facility] Advance Health Care Directives Advance Health Care Directives ([email or web address]    Next Medicare Annual Wellness Visit scheduled for next year: Yes, 10/21/23 @ 1:40pm  Preventive Care 65 Years and Older, Female Preventive care refers to lifestyle choices and visits with your health care provider that can promote health and wellness. What does preventive care include? A yearly physical exam. This is also called an annual well check. Dental exams once or twice a year. Routine eye exams. Ask your health care provider how often you should have your eyes checked. Personal lifestyle choices, including: Daily care of your teeth and gums. Regular physical activity. Eating a healthy diet. Avoiding tobacco and drug use. Limiting alcohol use. Practicing safe sex. Taking low-dose aspirin every day. Taking vitamin and mineral supplements as recommended by your health care provider. What happens during an annual well check? The services and screenings done by your  health care provider during your annual well check will depend on your age, overall health, lifestyle risk factors, and family history of disease. Counseling  Your health care provider may ask you questions about your: Alcohol use. Tobacco use. Drug use. Emotional well-being. Home and relationship well-being. Sexual activity. Eating habits. History of falls. Memory and ability to understand (cognition). Work and work Astronomer. Reproductive health. Screening  You may have the following tests or measurements: Height, weight, and BMI. Blood pressure. Lipid and cholesterol levels. These may be checked every 5 years, or more frequently if you are over 38 years old. Skin check. Lung cancer screening. You may have this screening every year starting at age 35 if you have a 30-pack-year history of smoking and currently smoke or have quit within the past 15 years. Fecal occult blood test (FOBT) of the stool. You may have this test every year starting at age 8. Flexible sigmoidoscopy or colonoscopy. You may have a sigmoidoscopy every 5 years or a colonoscopy every 10 years starting at age 57. Hepatitis C blood test. Hepatitis B blood test. Sexually transmitted disease (STD) testing. Diabetes screening. This is done by checking your blood sugar (glucose) after you have not eaten for a while (fasting). You may have this done every 1-3 years. Bone density scan. This is done to screen for osteoporosis. You may have this done starting at age 48. Mammogram. This may be done every 1-2 years. Talk to your health care provider about how often you should have regular mammograms. Talk with your health care provider about your test results, treatment options, and  if necessary, the need for more tests. Vaccines  Your health care provider may recommend certain vaccines, such as: Influenza vaccine. This is recommended every year. Tetanus, diphtheria, and acellular pertussis (Tdap, Td) vaccine. You may  need a Td booster every 10 years. Zoster vaccine. You may need this after age 7. Pneumococcal 13-valent conjugate (PCV13) vaccine. One dose is recommended after age 26. Pneumococcal polysaccharide (PPSV23) vaccine. One dose is recommended after age 50. Talk to your health care provider about which screenings and vaccines you need and how often you need them. This information is not intended to replace advice given to you by your health care provider. Make sure you discuss any questions you have with your health care provider. Document Released: 04/04/2015 Document Revised: 11/26/2015 Document Reviewed: 01/07/2015 Elsevier Interactive Patient Education  2017 [employer].  Fall Prevention in the Home Falls can cause injuries. They can happen to people of all ages. There are many things you can do to make your home safe and to help prevent falls. What can I do on the outside of my home? Regularly fix the edges of walkways and driveways and fix any cracks. Remove anything that might make you trip as you walk through a door, such as a raised step or threshold. Trim any bushes or trees on the path to your home. Use bright outdoor lighting. Clear any walking paths of anything that might make someone trip, such as rocks or tools. Regularly check to see if handrails are loose or broken. Make sure that both sides of any steps have handrails. Any raised decks and porches should have guardrails on the edges. Have any leaves, snow, or ice cleared regularly. Use sand or salt on walking paths during winter. Clean up any spills in your garage right away. This includes oil or grease spills. What can I do in the bathroom? Use night lights. Install grab bars by the toilet and in the tub and shower. Do not use towel bars as grab bars. Use non-skid mats or decals in the tub or shower. If you need to sit down in the shower, use a plastic, non-slip stool. Keep the floor dry. Clean up any water that spills on  the floor as soon as it happens. Remove soap buildup in the tub or shower regularly. Attach bath mats securely with double-sided non-slip rug tape. Do not have throw rugs and other things on the floor that can make you trip. What can I do in the bedroom? Use night lights. Make sure that you have a light by your bed that is easy to reach. Do not use any sheets or blankets that are too big for your bed. They should not hang down onto the floor. Have a firm chair that has side arms. You can use this for support while you get dressed. Do not have throw rugs and other things on the floor that can make you trip. What can I do in the kitchen? Clean up any spills right away. Avoid walking on wet floors. Keep items that you use a lot in easy-to-reach places. If you need to reach something above you, use a strong step stool that has a grab bar. Keep electrical cords out of the way. Do not use floor polish or wax that makes floors slippery. If you must use wax, use non-skid floor wax. Do not have throw rugs and other things on the floor that can make you trip. What can I do with my stairs? Do not leave any  items on the stairs. Make sure that there are handrails on both sides of the stairs and use them. Fix handrails that are broken or loose. Make sure that handrails are as long as the stairways. Check any carpeting to make sure that it is firmly attached to the stairs. Fix any carpet that is loose or worn. Avoid having throw rugs at the top or bottom of the stairs. If you do have throw rugs, attach them to the floor with carpet tape. Make sure that you have a light switch at the top of the stairs and the bottom of the stairs. If you do not have them, ask someone to add them for you. What else can I do to help prevent falls? Wear shoes that: Do not have high heels. Have rubber bottoms. Are comfortable and fit you well. Are closed at the toe. Do not wear sandals. If you use a stepladder: Make sure  that it is fully opened. Do not climb a closed stepladder. Make sure that both sides of the stepladder are locked into place. Ask someone to hold it for you, if possible. Clearly mark and make sure that you can see: Any grab bars or handrails. First and last steps. Where the edge of each step is. Use tools that help you move around (mobility aids) if they are needed. These include: Canes. Walkers. Scooters. Crutches. Turn on the lights when you go into a dark area. Replace any light bulbs as soon as they burn out. Set up your furniture so you have a clear path. Avoid moving your furniture around. If any of your floors are uneven, fix them. If there are any pets around you, be aware of where they are. Review your medicines with your doctor. Some medicines can make you feel dizzy. This can increase your chance of falling. Ask your doctor what other things that you can do to help prevent falls. This information is not intended to replace advice given to you by your health care provider. Make sure you discuss any questions you have with your health care provider. Document Released: 01/02/2009 Document Revised: 08/14/2015 Document Reviewed: 04/12/2014 Elsevier Interactive Patient Education  2017 [employer].

## 2022-10-22 ENCOUNTER — Other Ambulatory Visit: Payer: Self-pay | Admitting: Family Medicine

## 2022-10-22 DIAGNOSIS — E559 Vitamin D deficiency, unspecified: Secondary | ICD-10-CM

## 2022-10-26 DIAGNOSIS — M9907 Segmental and somatic dysfunction of upper extremity: Secondary | ICD-10-CM | POA: Diagnosis not present

## 2022-10-26 DIAGNOSIS — M9902 Segmental and somatic dysfunction of thoracic region: Secondary | ICD-10-CM | POA: Diagnosis not present

## 2022-10-26 DIAGNOSIS — M9903 Segmental and somatic dysfunction of lumbar region: Secondary | ICD-10-CM | POA: Diagnosis not present

## 2022-10-26 DIAGNOSIS — M9901 Segmental and somatic dysfunction of cervical region: Secondary | ICD-10-CM | POA: Diagnosis not present

## 2022-11-02 DIAGNOSIS — M9903 Segmental and somatic dysfunction of lumbar region: Secondary | ICD-10-CM | POA: Diagnosis not present

## 2022-11-02 DIAGNOSIS — M9902 Segmental and somatic dysfunction of thoracic region: Secondary | ICD-10-CM | POA: Diagnosis not present

## 2022-11-02 DIAGNOSIS — M9901 Segmental and somatic dysfunction of cervical region: Secondary | ICD-10-CM | POA: Diagnosis not present

## 2022-11-09 DIAGNOSIS — M9903 Segmental and somatic dysfunction of lumbar region: Secondary | ICD-10-CM | POA: Diagnosis not present

## 2022-11-09 DIAGNOSIS — M9901 Segmental and somatic dysfunction of cervical region: Secondary | ICD-10-CM | POA: Diagnosis not present

## 2022-11-09 DIAGNOSIS — M9902 Segmental and somatic dysfunction of thoracic region: Secondary | ICD-10-CM | POA: Diagnosis not present

## 2022-11-18 ENCOUNTER — Telehealth: Payer: Self-pay | Admitting: Family Medicine

## 2022-11-18 DIAGNOSIS — M9901 Segmental and somatic dysfunction of cervical region: Secondary | ICD-10-CM | POA: Diagnosis not present

## 2022-11-18 DIAGNOSIS — M9903 Segmental and somatic dysfunction of lumbar region: Secondary | ICD-10-CM | POA: Diagnosis not present

## 2022-11-18 DIAGNOSIS — M9907 Segmental and somatic dysfunction of upper extremity: Secondary | ICD-10-CM | POA: Diagnosis not present

## 2022-11-18 DIAGNOSIS — M9902 Segmental and somatic dysfunction of thoracic region: Secondary | ICD-10-CM | POA: Diagnosis not present

## 2022-11-18 NOTE — Telephone Encounter (Signed)
Initial Comment Caller states she is calling because her BP is up and down and she is concerned. Her last BP was 132/97. Translation No Nurse Assessment Nurse: Zena Amos, RN, Margaret Date/Time (Eastern Time): 11/18/2022 12:25:40 PM Confirm and document reason for call. If symptomatic, describe symptoms. ---Caller states she had her bp checked at her chiropractor visit, 132/ 97. Concerned cause that sounds high to her. She has been off her bp medications for years. Does the patient have any new or worsening symptoms? ---Yes Will a triage be completed? ---Yes Related visit to physician within the last 2 weeks? ---No Does the PT have any chronic conditions? (i.e. diabetes, asthma, this includes High risk factors for pregnancy, etc.) ---Yes List chronic conditions. ---HTN, Is this a behavioral health or substance abuse call? ---No Guidelines Guideline Title Affirmed Question Affirmed Notes Nurse Date/Time (Eastern Time) Blood Pressure - High [1] Systolic BP >= 130 OR Diastolic >= 80 AND [2] not taking BP medications Zena Amos, RN, Maria [name] 11/18/2022 12:27:27 PM PLEASE NOTE: All timestamps contained within this report are represented as [place] Standard Time. CONFIDENTIALTY NOTICE: This fax transmission is intended only for the addressee. It contains information that is legally privileged, confidential or otherwise protected from use or disclosure. If you are not the intended recipient, you are strictly prohibited from reviewing, disclosing, copying using or disseminating any of this information or taking any action in reliance on or regarding this information. If you have received this fax in error, please notify us immediately by telephone so that we can arrange for its return to [place]. Phone: [phone], Toll-Free: [phone], Fax: [phone] Page: 2 of 2 Call Id: [id number] Disp. Time Maria [name] Time) Disposition Final User 11/18/2022 12:29:55 PM See PCP within 2 Weeks Yes  Zena Amos, RN, Maria [name] Final Disposition 11/18/2022 12:29:55 PM See PCP within 2 Weeks Yes Zena Amos, RN, Maria [name] Disagree/Comply Comply Caller Understands Yes PreDisposition Call Doctor Care Advice Given Per Guideline SEE PCP WITHIN 2 WEEKS: * You need to be seen for this ongoing problem within the next 2 weeks. * PCP VISIT: Call your doctor (or NP/PA) during regular office hours and make an appointment. CALL BACK IF: * Difficulty walking, difficulty talking, or severe headache occurs * Chest pain or difficulty breathing occurs * Your blood pressure is over 160/100 * You become worse CARE ADVICE given per High Blood Pressure (Adult) guideline. Referrals REFERRED TO PCP OFFICE

## 2022-11-18 NOTE — Telephone Encounter (Signed)
FYI: This call has been transferred to triage nurse: the Triage Nurse. Once the result note has been entered staff can address the message at that time.  Patient called in with the following symptoms:  Red Word:elevated blood pressure and dizziness    Please advise at Mobile [phone] (mobile)  Message is routed to Provider Pool.

## 2022-11-18 NOTE — Telephone Encounter (Signed)
BP Readings from Last 3 Encounters:  10/08/22 129/61  06/28/22 116/60  03/30/22 (!) 140/80   Called her back and LMOM as she did not answer right now.  Inquired if this was the only elevated blood pressure she has had recently or if this is a pattern?  If chest pain, headaches, or other concerns please seek care right away.  Otherwise likely okay to wait and be seen next week.  Perhaps she could get a few more blood pressure readings between now and then to gather more data

## 2022-11-18 NOTE — Telephone Encounter (Signed)
Pt is scheduled on 11/25/22, do you think she needs to be seen sooner than that for her BP?

## 2022-11-19 ENCOUNTER — Encounter: Payer: Self-pay | Admitting: Physician Assistant

## 2022-11-19 ENCOUNTER — Ambulatory Visit: Payer: Medicare Other | Admitting: Physician Assistant

## 2022-11-19 VITALS — BP 142/82 | HR 66 | Temp 98.4°F | Resp 20 | Wt 185.8 lb

## 2022-11-19 DIAGNOSIS — I10 Essential (primary) hypertension: Secondary | ICD-10-CM | POA: Diagnosis not present

## 2022-11-19 DIAGNOSIS — J069 Acute upper respiratory infection, unspecified: Secondary | ICD-10-CM | POA: Diagnosis not present

## 2022-11-19 DIAGNOSIS — R0981 Nasal congestion: Secondary | ICD-10-CM | POA: Diagnosis not present

## 2022-11-19 LAB — POC COVID19 BINAXNOW: SARS Coronavirus 2 Ag: NEGATIVE

## 2022-11-19 NOTE — Assessment & Plan Note (Addendum)
Currently unmedicated , chronic Elevated in office, but pt unwell today  Advised she monitor at home daily, record, and f/b in office in 2 weeks. Appt made with pcp

## 2022-11-19 NOTE — Progress Notes (Signed)
Established patient visit   Patient: Maria [name]   DOB: 10/14/1950   72 y.o. Female  MRN: [id number] Visit Date: 11/19/2022  Today's healthcare provider: Alfredia Ferguson, PA-C   Chief Complaint  Patient presents with   Sore Throat   Cough    Cough with runny nose   Subjective    Sore Throat  Associated symptoms include congestion and coughing. Pertinent negatives include no abdominal pain, headaches or shortness of breath.  Cough Associated symptoms include a sore throat. Pertinent negatives include no chest pain, fever, headaches or shortness of breath.    Pt reports nasal congestion, sore throat, slight cough for 3-4 days. Reports negative COVID test at home two days ago .  Pt reports seeing a few high blood pressures at home -- yesterday 130s/90s. Reports one episode of dizziness last week when trying to catch a train- has not recurred. She does not check her blood pressure consistently.  Medications: Outpatient Medications Prior to Visit  Medication Sig   acetaminophen (TYLENOL) 325 MG tablet Take 650 mg by mouth every 6 (six) hours as needed for pain or headache.   b complex vitamins tablet Take 1 tablet by mouth daily.   celecoxib (CELEBREX) 100 MG capsule Take 1 capsule (100 mg total) by mouth 2 (two) times daily. Use as needed for arthritis pain   Cholecalciferol (VITAMIN D3) 1.25 MG (50000 UT) CAPS Take 1 weekly for 12 weeks   Cholecalciferol (VITAMIN D3) 1.25 MG (50000 UT) capsule Take 1 weekly for 12 weeks   Multiple Vitamin (MULTIVITAMIN) tablet Take 1 tablet by mouth daily.   naproxen (NAPROSYN) 500 MG tablet Take 1 tablet (500 mg total) by mouth 2 (two) times daily with a meal.   omeprazole (PRILOSEC) 20 MG capsule Take 1 capsule (20 mg total) by mouth daily.   Peppermint Oil (IBGARD) 90 MG CPCR Take as directed   polyethylene glycol (MIRALAX) 17 g packet Take 17 g by mouth daily. Titrate up as needed to achieve 1 bowel movement daily   rosuvastatin  (CRESTOR) 10 MG tablet Take 1 tablet (10 mg total) by mouth daily.   No facility-administered medications prior to visit.    Review of Systems  Constitutional:  Negative for fatigue and fever.  HENT:  Positive for congestion and sore throat.   Respiratory:  Positive for cough. Negative for shortness of breath.   Cardiovascular:  Negative for chest pain and leg swelling.  Gastrointestinal:  Negative for abdominal pain.  Neurological:  Negative for dizziness and headaches.      Objective    BP (!) 142/82   Pulse 66   Temp 98.4 F (36.9 C) (Oral)   Resp 20   Wt 185 lb 12.8 oz (84.3 kg)   SpO2 98%   BMI 29.99 kg/m   Physical Exam Constitutional:      General: She is awake.     Appearance: She is well-developed.  HENT:     Head: Normocephalic.     Right Ear: Tympanic membrane normal.     Left Ear: Tympanic membrane normal.     Mouth/Throat:     Pharynx: Posterior oropharyngeal erythema present. No oropharyngeal exudate.  Eyes:     Conjunctiva/sclera: Conjunctivae normal.  Cardiovascular:     Rate and Rhythm: Normal rate and regular rhythm.     Heart sounds: Normal heart sounds.  Pulmonary:     Effort: Pulmonary effort is normal.     Breath sounds: Normal breath sounds.  Skin:    General: Skin is warm.  Neurological:     Mental Status: She is alert and oriented to person, place, and time.  Psychiatric:        Attention and Perception: Attention normal.        Mood and Affect: Mood normal.        Speech: Speech normal.        Behavior: Behavior is cooperative.      No results found for any visits on 11/19/22.  Assessment & Plan     1. Upper respiratory tract infection, unspecified type 2. Nasal congestion COVID negative. Advised fluids, rest, tylenol , mucinex Problem List Items Addressed This Visit       Cardiovascular and Mediastinum   Hypertension    Currently unmedicated , chronic Elevated in office, but pt unwell today  Advised she monitor at home  daily, record, and f/b in office in 2 weeks. Appt made with pcp      Other Visit Diagnoses     Upper respiratory tract infection, unspecified type    -  Primary   Nasal congestion       Relevant Orders   POC COVID-19        Return in about 2 weeks (around 12/03/2022), or if symptoms worsen or fail to improve, for hypertension.      I, Alfredia Ferguson, PA-C have reviewed all documentation for this visit. The documentation on  11/19/22   for the exam, diagnosis, procedures, and orders are all accurate and complete.    Alfredia Ferguson, PA-C  [facility] [phone] (phone) [phone] (fax)  [facility]

## 2022-11-23 DIAGNOSIS — M9902 Segmental and somatic dysfunction of thoracic region: Secondary | ICD-10-CM | POA: Diagnosis not present

## 2022-11-23 DIAGNOSIS — M9903 Segmental and somatic dysfunction of lumbar region: Secondary | ICD-10-CM | POA: Diagnosis not present

## 2022-11-23 DIAGNOSIS — M9901 Segmental and somatic dysfunction of cervical region: Secondary | ICD-10-CM | POA: Diagnosis not present

## 2022-11-23 NOTE — Patient Instructions (Addendum)
It was good to see you again today, hope you are feeling better soon!  Recommend COVID booster and flu shot this fall, shingles vaccine series at your convenience  I will be in touch with your blood work ASAP  Please start back on the hydrochlorothiazide 12.5 mg daily for blood pressure  I gave you a paper prescription for doxycycline antibiotic.  If your cold symptoms are not better by the end of the week please start on this medication

## 2022-11-23 NOTE — Progress Notes (Signed)
[facility] [address] [place], [place] [address] [phone]  Date:  11/24/2022   Name:  Maria [name]   DOB:  1950-03-30   MRN:  [id number]  PCP:  Pearline Cables, MD    Chief Complaint: cough, congestion (Pt feels like she is not turning the corner from when she was seen by Lillia Abed. )   History of Present Illness:  Maria [name] is a 72 y.o. very pleasant female patient who presents with the following:  Patient seen today with concern of cough and congestion Most recent with myself was in April  History of dyslipidemia, obesity, hypertension, osteoporosis, prediabetes, GERD   She was seen by my partner Burnett Corrente, on August 30 with concern of cough and runny nose COVID was negative, supportive care recommended  Since that time pt notes she is still bringing up a lot of mucus from her nose esp in the am She may get some coughing spells No fever She feels overall good but she is coughing up some mucus She has used some mucinex   BP Readings from Last 3 Encounters:  11/24/22 (!) 150/87  11/19/22 (!) 142/82  10/08/22 129/61      Patient Active Problem List   Diagnosis Date Noted   Atherosclerosis of aorta (HCC) 02/17/2022   Mixed dyslipidemia 06/30/2021   Obesity (BMI 30.0-34.9) 06/30/2021   Cardiac murmur 06/30/2021   Arthritis 06/24/2021   Colon polyps 06/24/2021   Diverticulosis 06/24/2021   Hemorrhoids, external without complications 06/24/2021   Hemorrhoids, internal 06/24/2021   Hypertension 06/24/2021   Osteoporosis 06/24/2021   Tubular adenoma of colon 06/24/2021   Prediabetes 10/13/2015   Gastro-esophageal reflux disease with esophagitis 11/20/2014   HH (hiatus hernia) 2014   NONSPECIFIC ABN FINDING RAD & OTH EXAM GI TRACT 09/19/2009    Past Medical History:  Diagnosis Date   Arthritis    Cardiac murmur 06/30/2021   Colon polyps    Diverticulosis    Gastro-esophageal  reflux disease with esophagitis 11/20/2014   Endoscopy 10/2014 per DR. Hung   GERD (gastroesophageal reflux disease)    Hemorrhoids, external without complications    Hemorrhoids, internal    HH (hiatus hernia) 2014   HH (hiatus hernia) 2014   Hypertension    Mixed dyslipidemia 06/30/2021   NONSPECIFIC ABN FINDING RAD & OTH EXAM GI TRACT 09/19/2009   Qualifier: Diagnosis of  By: Christella Hartigan MD, Melton Alar    Obesity (BMI 30.0-34.9) 06/30/2021   Osteoporosis    pre   Prediabetes 10/13/2015   Tubular adenoma of colon     Past Surgical History:  Procedure Laterality Date   COLONOSCOPY     [id number]   ESOPHAGEAL MANOMETRY N/A 12/13/2014   Procedure: ESOPHAGEAL MANOMETRY (EM);  Surgeon: Jeani Hawking, MD;  Location: [facility];  Service: Endoscopy;  Laterality: N/A;   POLYPECTOMY     SPINE SURGERY     Neck   UPPER GASTROINTESTINAL ENDOSCOPY      Social History   Tobacco Use   Smoking status: Never   Smokeless tobacco: Never  Vaping Use   Vaping status: Never Used  Substance Use Topics   Alcohol use: No    Alcohol/week: 0.0 standard drinks of alcohol   Drug use: No    Family History  Problem Relation Age of Onset   Other Mother        smoke inhalation   Hypertension Father  Arthritis Brother    Colon cancer Neg Hx    Rectal cancer Neg Hx    Esophageal cancer Neg Hx    Stomach cancer Neg Hx    Pancreatic cancer Neg Hx    Prostate cancer Neg Hx    Colon polyps Neg Hx     No Known Allergies  Medication list has been reviewed and updated.  Current Outpatient Medications on File Prior to Visit  Medication Sig Dispense Refill   acetaminophen (TYLENOL) 325 MG tablet Take 650 mg by mouth every 6 (six) hours as needed for pain or headache.     b complex vitamins tablet Take 1 tablet by mouth daily.     celecoxib (CELEBREX) 100 MG capsule Take 1 capsule (100 mg total) by mouth 2 (two) times daily. Use as needed for arthritis pain 60 capsule 1   Cholecalciferol (VITAMIN D3)  1.25 MG (50000 UT) CAPS Take 1 weekly for 12 weeks 12 capsule 0   Cholecalciferol (VITAMIN D3) 1.25 MG (50000 UT) capsule Take 1 weekly for 12 weeks 12 capsule 0   Multiple Vitamin (MULTIVITAMIN) tablet Take 1 tablet by mouth daily.     naproxen (NAPROSYN) 500 MG tablet Take 1 tablet (500 mg total) by mouth 2 (two) times daily with a meal. 30 tablet 3   omeprazole (PRILOSEC) 20 MG capsule Take 1 capsule (20 mg total) by mouth daily. 90 capsule 3   Peppermint Oil (IBGARD) 90 MG CPCR Take as directed 12 capsule 0   polyethylene glycol (MIRALAX) 17 g packet Take 17 g by mouth daily. Titrate up as needed to achieve 1 bowel movement daily 14 each 0   rosuvastatin (CRESTOR) 10 MG tablet Take 1 tablet (10 mg total) by mouth daily. 90 tablet 3   No current facility-administered medications on file prior to visit.    Review of Systems:  As per HPI- otherwise negative.   Physical Examination: Vitals:   11/24/22 1445  BP: (!) 150/87  Pulse: 65  Resp: 18  Temp: 97.6 F (36.4 C)  SpO2: 97%   Vitals:   11/24/22 1445  Weight: 186 lb 12.8 oz (84.7 kg)  Height: 5\' 6"  (1.676 m)   Body mass index is 30.15 kg/m. Ideal Body Weight: Weight in (lb) to have BMI = 25: 154.6  GEN: no acute distress.  Obese, looks well HEENT: Atraumatic, Normocephalic.  Bilateral TM wnl, oropharynx normal.  PEERL,EOMI.   Ears and Nose: No external deformity. CV: RRR, No M/G/R. No JVD. No thrill. No extra heart sounds. PULM: CTA B, no wheezes, crackles, rhonchi. No retractions. No resp. distress. No accessory muscle use. ABD: S, NT, ND, +BS. No rebound. No HSM. EXTR: No c/c/e PSYCH: Normally interactive. Conversant.    Assessment and Plan: Vitamin D deficiency - Plan: VITAMIN D 25 Hydroxy (Vit-D Deficiency, Fractures)  Essential hypertension - Plan: hydrochlorothiazide (HYDRODIURIL) 12.5 MG tablet, CBC, Comprehensive metabolic panel, TSH  Pre-diabetes - Plan: Hemoglobin A1c  Dyslipidemia - Plan: Lipid  panel  Nasal congestion - Plan: doxycycline (VIBRAMYCIN) 100 MG capsule  Patient seen today for follow-up.  She has stopped her hydrochlorothiazide, I am not totally clear on why.  Blood pressure is up today.  She is fine going back on HCTZ, prescribe this for her today.  Will obtain routine blood work as above  Likely viral URI, she feels well except for some nasal congestion and sometimes bringing up mucus.  However, she has been sick for close to a week I did provide  a prescription for doxycycline that she can fill and use if not getting better by this weekend  Will plan further follow- up pending labs.  Signed Abbe Amsterdam, MD  Addendum 9/5, received labs as below.  Message to patient  Results for orders placed or performed in visit on 11/24/22  CBC  Result Value Ref Range   WBC 9.2 4.0 - 10.5 K/uL   RBC 4.14 3.87 - 5.11 Mil/uL   Platelets 308.0 150.0 - 400.0 K/uL   Hemoglobin 12.4 12.0 - 15.0 g/dL   HCT 16.1 09.6 - 04.5 %   MCV 91.1 78.0 - 100.0 fl   MCHC 33.0 30.0 - 36.0 g/dL   RDW 40.9 81.1 - 91.4 %  Comprehensive metabolic panel  Result Value Ref Range   Sodium 138 135 - 145 mEq/L   Potassium 4.3 3.5 - 5.1 mEq/L   Chloride 102 96 - 112 mEq/L   CO2 29 19 - 32 mEq/L   Glucose, Bld 97 70 - 99 mg/dL   BUN 13 6 - 23 mg/dL   Creatinine, Ser 7.82 0.40 - 1.20 mg/dL   Total Bilirubin 0.4 0.2 - 1.2 mg/dL   Alkaline Phosphatase 96 39 - 117 U/L   AST 20 0 - 37 U/L   ALT 22 0 - 35 U/L   Total Protein 6.9 6.0 - 8.3 g/dL   Albumin 4.0 3.5 - 5.2 g/dL   GFR 95.62 (L) >13.08 mL/min   Calcium 9.9 8.4 - 10.5 mg/dL  Hemoglobin M5H  Result Value Ref Range   Hgb A1c MFr Bld 6.3 4.6 - 6.5 %  Lipid panel  Result Value Ref Range   Cholesterol 200 0 - 200 mg/dL   Triglycerides 846.9 0.0 - 149.0 mg/dL   HDL 62.95 >28.41 mg/dL   VLDL 32.4 0.0 - 40.1 mg/dL   LDL Cholesterol 027 (H) 0 - 99 mg/dL   Total CHOL/HDL Ratio 4    NonHDL 151.80   TSH  Result Value Ref Range   TSH 1.90  0.35 - 5.50 uIU/mL  VITAMIN D 25 Hydroxy (Vit-D Deficiency, Fractures)  Result Value Ref Range   VITD 38.20 30.00 - 100.00 ng/mL

## 2022-11-24 ENCOUNTER — Ambulatory Visit (INDEPENDENT_AMBULATORY_CARE_PROVIDER_SITE_OTHER): Payer: Medicare Other | Admitting: Family Medicine

## 2022-11-24 VITALS — BP 150/87 | HR 65 | Temp 97.6°F | Resp 18 | Ht 66.0 in | Wt 186.8 lb

## 2022-11-24 DIAGNOSIS — E559 Vitamin D deficiency, unspecified: Secondary | ICD-10-CM

## 2022-11-24 DIAGNOSIS — R7303 Prediabetes: Secondary | ICD-10-CM | POA: Diagnosis not present

## 2022-11-24 DIAGNOSIS — R0981 Nasal congestion: Secondary | ICD-10-CM | POA: Diagnosis not present

## 2022-11-24 DIAGNOSIS — I10 Essential (primary) hypertension: Secondary | ICD-10-CM

## 2022-11-24 DIAGNOSIS — E785 Hyperlipidemia, unspecified: Secondary | ICD-10-CM

## 2022-11-24 MED ORDER — HYDROCHLOROTHIAZIDE 12.5 MG PO TABS
12.5000 mg | ORAL_TABLET | Freq: Every day | ORAL | 3 refills | Status: AC
Start: 2022-11-24 — End: ?

## 2022-11-24 MED ORDER — DOXYCYCLINE HYCLATE 100 MG PO CAPS
100.0000 mg | ORAL_CAPSULE | Freq: Two times a day (BID) | ORAL | 0 refills | Status: DC
Start: 2022-11-24 — End: 2023-07-12

## 2022-11-25 ENCOUNTER — Encounter: Payer: Self-pay | Admitting: Family Medicine

## 2022-11-25 ENCOUNTER — Ambulatory Visit: Payer: Medicare Other | Admitting: Family Medicine

## 2022-11-25 LAB — HEMOGLOBIN A1C: Hgb A1c MFr Bld: 6.3 % (ref 4.6–6.5)

## 2022-11-25 LAB — LIPID PANEL
Cholesterol: 200 mg/dL (ref 0–200)
HDL: 48.2 mg/dL (ref >39.00)
LDL Cholesterol: 128 mg/dL — ABNORMAL HIGH (ref 0–99)
NonHDL: 151.8
Total CHOL/HDL Ratio: 4
Triglycerides: 117 mg/dL (ref 0.0–149.0)
VLDL: 23.4 mg/dL (ref 0.0–40.0)

## 2022-11-25 LAB — COMPREHENSIVE METABOLIC PANEL
ALT: 22 U/L (ref 0–35)
AST: 20 U/L (ref 0–37)
Albumin: 4 g/dL (ref 3.5–5.2)
Alkaline Phosphatase: 96 U/L (ref 39–117)
BUN: 13 mg/dL (ref 6–23)
CO2: 29 meq/L (ref 19–32)
Calcium: 9.9 mg/dL (ref 8.4–10.5)
Chloride: 102 meq/L (ref 96–112)
Creatinine, Ser: 0.95 mg/dL (ref 0.40–1.20)
GFR: 59.66 mL/min — ABNORMAL LOW (ref >60.00)
Glucose, Bld: 97 mg/dL (ref 70–99)
Potassium: 4.3 meq/L (ref 3.5–5.1)
Sodium: 138 meq/L (ref 135–145)
Total Bilirubin: 0.4 mg/dL (ref 0.2–1.2)
Total Protein: 6.9 g/dL (ref 6.0–8.3)

## 2022-11-25 LAB — CBC
HCT: 37.7 % (ref 36.0–46.0)
Hemoglobin: 12.4 g/dL (ref 12.0–15.0)
MCHC: 33 g/dL (ref 30.0–36.0)
MCV: 91.1 fl (ref 78.0–100.0)
Platelets: 308 10*3/uL (ref 150.0–400.0)
RBC: 4.14 Mil/uL (ref 3.87–5.11)
RDW: 12.6 % (ref 11.5–15.5)
WBC: 9.2 10*3/uL (ref 4.0–10.5)

## 2022-11-25 LAB — VITAMIN D 25 HYDROXY (VIT D DEFICIENCY, FRACTURES): VITD: 38.2 ng/mL (ref 30.00–100.00)

## 2022-11-25 LAB — TSH: TSH: 1.9 u[IU]/mL (ref 0.35–5.50)

## 2022-11-26 ENCOUNTER — Encounter: Payer: Self-pay | Admitting: Pharmacist

## 2022-11-28 NOTE — Progress Notes (Deleted)
[facility] [address] [place], [place] [address] [phone]  Date:  12/01/2022   Name:  Maria [name]   DOB:  21-Sep-1950   MRN:  [id number]  PCP:  Pearline Cables, MD    Chief Complaint: No chief complaint on file.   History of Present Illness:  Maria [name] is a 72 y.o. very pleasant female patient who presents with the following:  Patient seen today for blood pressure follow-up-history of hypertension, GERD, dyslipidemia, prediabetes, osteoporosis I saw her just last week when she was sick with cough and congestion, she had been seen for the same issue on August 30 by one of my partners, supportive care recommended At our visit last week I did routine blood work, gave her a prescription for doxycycline to use if not improving.  She had stopped taking her hydrochlorothiazide at that time for some reason, asked her to start back on it and we can check effect/blood pressure today  Blood work done last week, overall looks good.  A1c 6.3%  Flu shot Recommend COVID booster this fall Recommend shingles vaccine  Patient Active Problem List   Diagnosis Date Noted   Atherosclerosis of aorta (HCC) 02/17/2022   Mixed dyslipidemia 06/30/2021   Obesity (BMI 30.0-34.9) 06/30/2021   Cardiac murmur 06/30/2021   Arthritis 06/24/2021   Colon polyps 06/24/2021   Diverticulosis 06/24/2021   Hemorrhoids, external without complications 06/24/2021   Hemorrhoids, internal 06/24/2021   Hypertension 06/24/2021   Osteoporosis 06/24/2021   Tubular adenoma of colon 06/24/2021   Prediabetes 10/13/2015   Gastro-esophageal reflux disease with esophagitis 11/20/2014   HH (hiatus hernia) 2014   NONSPECIFIC ABN FINDING RAD & OTH EXAM GI TRACT 09/19/2009    Past Medical History:  Diagnosis Date   Arthritis    Cardiac murmur 06/30/2021   Colon polyps    Diverticulosis    Gastro-esophageal reflux disease with esophagitis  11/20/2014   Endoscopy 10/2014 per DR. Hung   GERD (gastroesophageal reflux disease)    Hemorrhoids, external without complications    Hemorrhoids, internal    HH (hiatus hernia) 2014   HH (hiatus hernia) 2014   Hypertension    Mixed dyslipidemia 06/30/2021   NONSPECIFIC ABN FINDING RAD & OTH EXAM GI TRACT 09/19/2009   Qualifier: Diagnosis of  By: Christella Hartigan MD, Melton Alar    Obesity (BMI 30.0-34.9) 06/30/2021   Osteoporosis    pre   Prediabetes 10/13/2015   Tubular adenoma of colon     Past Surgical History:  Procedure Laterality Date   COLONOSCOPY     [id number]   ESOPHAGEAL MANOMETRY N/A 12/13/2014   Procedure: ESOPHAGEAL MANOMETRY (EM);  Surgeon: Jeani Hawking, MD;  Location: [facility];  Service: Endoscopy;  Laterality: N/A;   POLYPECTOMY     SPINE SURGERY     Neck   UPPER GASTROINTESTINAL ENDOSCOPY      Social History   Tobacco Use   Smoking status: Never   Smokeless tobacco: Never  Vaping Use   Vaping status: Never Used  Substance Use Topics   Alcohol use: No    Alcohol/week: 0.0 standard drinks of alcohol   Drug use: No    Family History  Problem Relation Age of Onset   Other Mother        smoke inhalation   Hypertension Father    Arthritis Brother    Colon cancer Neg Hx    Rectal cancer Neg Hx  Esophageal cancer Neg Hx    Stomach cancer Neg Hx    Pancreatic cancer Neg Hx    Prostate cancer Neg Hx    Colon polyps Neg Hx     No Known Allergies  Medication list has been reviewed and updated.  Current Outpatient Medications on File Prior to Visit  Medication Sig Dispense Refill   acetaminophen (TYLENOL) 325 MG tablet Take 650 mg by mouth every 6 (six) hours as needed for pain or headache.     b complex vitamins tablet Take 1 tablet by mouth daily.     celecoxib (CELEBREX) 100 MG capsule Take 1 capsule (100 mg total) by mouth 2 (two) times daily. Use as needed for arthritis pain 60 capsule 1   Cholecalciferol (VITAMIN D3) 1.25 MG (50000 UT) CAPS Take 1  weekly for 12 weeks 12 capsule 0   Cholecalciferol (VITAMIN D3) 1.25 MG (50000 UT) capsule Take 1 weekly for 12 weeks 12 capsule 0   doxycycline (VIBRAMYCIN) 100 MG capsule Take 1 capsule (100 mg total) by mouth 2 (two) times daily. 20 capsule 0   hydrochlorothiazide (HYDRODIURIL) 12.5 MG tablet Take 1 tablet (12.5 mg total) by mouth daily. 90 tablet 3   Multiple Vitamin (MULTIVITAMIN) tablet Take 1 tablet by mouth daily.     naproxen (NAPROSYN) 500 MG tablet Take 1 tablet (500 mg total) by mouth 2 (two) times daily with a meal. 30 tablet 3   omeprazole (PRILOSEC) 20 MG capsule Take 1 capsule (20 mg total) by mouth daily. 90 capsule 3   Peppermint Oil (IBGARD) 90 MG CPCR Take as directed 12 capsule 0   polyethylene glycol (MIRALAX) 17 g packet Take 17 g by mouth daily. Titrate up as needed to achieve 1 bowel movement daily 14 each 0   rosuvastatin (CRESTOR) 10 MG tablet Take 1 tablet (10 mg total) by mouth daily. 90 tablet 3   No current facility-administered medications on file prior to visit.    Review of Systems:  As per HPI- otherwise negative.   Physical Examination: There were no vitals filed for this visit. There were no vitals filed for this visit. There is no height or weight on file to calculate BMI. Ideal Body Weight:    GEN: no acute distress. HEENT: Atraumatic, Normocephalic.  Ears and Nose: No external deformity. CV: RRR, No M/G/R. No JVD. No thrill. No extra heart sounds. PULM: CTA B, no wheezes, crackles, rhonchi. No retractions. No resp. distress. No accessory muscle use. ABD: S, NT, ND, +BS. No rebound. No HSM. EXTR: No c/c/e PSYCH: Normally interactive. Conversant.    Assessment and Plan: ***  Signed Abbe Amsterdam, MD

## 2022-11-30 DIAGNOSIS — M9901 Segmental and somatic dysfunction of cervical region: Secondary | ICD-10-CM | POA: Diagnosis not present

## 2022-11-30 DIAGNOSIS — M9903 Segmental and somatic dysfunction of lumbar region: Secondary | ICD-10-CM | POA: Diagnosis not present

## 2022-11-30 DIAGNOSIS — M9902 Segmental and somatic dysfunction of thoracic region: Secondary | ICD-10-CM | POA: Diagnosis not present

## 2022-12-01 ENCOUNTER — Ambulatory Visit: Payer: Medicare Other | Admitting: Family Medicine

## 2022-12-01 DIAGNOSIS — I10 Essential (primary) hypertension: Secondary | ICD-10-CM

## 2022-12-02 DIAGNOSIS — M9902 Segmental and somatic dysfunction of thoracic region: Secondary | ICD-10-CM | POA: Diagnosis not present

## 2022-12-02 DIAGNOSIS — M9903 Segmental and somatic dysfunction of lumbar region: Secondary | ICD-10-CM | POA: Diagnosis not present

## 2022-12-02 DIAGNOSIS — M9901 Segmental and somatic dysfunction of cervical region: Secondary | ICD-10-CM | POA: Diagnosis not present

## 2022-12-07 DIAGNOSIS — M9901 Segmental and somatic dysfunction of cervical region: Secondary | ICD-10-CM | POA: Diagnosis not present

## 2022-12-07 DIAGNOSIS — M9902 Segmental and somatic dysfunction of thoracic region: Secondary | ICD-10-CM | POA: Diagnosis not present

## 2022-12-07 DIAGNOSIS — M9903 Segmental and somatic dysfunction of lumbar region: Secondary | ICD-10-CM | POA: Diagnosis not present

## 2022-12-09 DIAGNOSIS — M9902 Segmental and somatic dysfunction of thoracic region: Secondary | ICD-10-CM | POA: Diagnosis not present

## 2022-12-09 DIAGNOSIS — M9903 Segmental and somatic dysfunction of lumbar region: Secondary | ICD-10-CM | POA: Diagnosis not present

## 2022-12-09 DIAGNOSIS — M9901 Segmental and somatic dysfunction of cervical region: Secondary | ICD-10-CM | POA: Diagnosis not present

## 2022-12-14 DIAGNOSIS — M9901 Segmental and somatic dysfunction of cervical region: Secondary | ICD-10-CM | POA: Diagnosis not present

## 2022-12-14 DIAGNOSIS — M9903 Segmental and somatic dysfunction of lumbar region: Secondary | ICD-10-CM | POA: Diagnosis not present

## 2022-12-14 DIAGNOSIS — M9902 Segmental and somatic dysfunction of thoracic region: Secondary | ICD-10-CM | POA: Diagnosis not present

## 2022-12-15 DIAGNOSIS — M9903 Segmental and somatic dysfunction of lumbar region: Secondary | ICD-10-CM | POA: Diagnosis not present

## 2022-12-15 DIAGNOSIS — M9901 Segmental and somatic dysfunction of cervical region: Secondary | ICD-10-CM | POA: Diagnosis not present

## 2022-12-15 DIAGNOSIS — M9902 Segmental and somatic dysfunction of thoracic region: Secondary | ICD-10-CM | POA: Diagnosis not present

## 2022-12-21 DIAGNOSIS — M9902 Segmental and somatic dysfunction of thoracic region: Secondary | ICD-10-CM | POA: Diagnosis not present

## 2022-12-21 DIAGNOSIS — M9903 Segmental and somatic dysfunction of lumbar region: Secondary | ICD-10-CM | POA: Diagnosis not present

## 2022-12-21 DIAGNOSIS — M9901 Segmental and somatic dysfunction of cervical region: Secondary | ICD-10-CM | POA: Diagnosis not present

## 2022-12-23 DIAGNOSIS — M9902 Segmental and somatic dysfunction of thoracic region: Secondary | ICD-10-CM | POA: Diagnosis not present

## 2022-12-23 DIAGNOSIS — M9903 Segmental and somatic dysfunction of lumbar region: Secondary | ICD-10-CM | POA: Diagnosis not present

## 2022-12-23 DIAGNOSIS — M9901 Segmental and somatic dysfunction of cervical region: Secondary | ICD-10-CM | POA: Diagnosis not present

## 2023-01-04 DIAGNOSIS — M9902 Segmental and somatic dysfunction of thoracic region: Secondary | ICD-10-CM | POA: Diagnosis not present

## 2023-01-04 DIAGNOSIS — M9901 Segmental and somatic dysfunction of cervical region: Secondary | ICD-10-CM | POA: Diagnosis not present

## 2023-01-04 DIAGNOSIS — M9903 Segmental and somatic dysfunction of lumbar region: Secondary | ICD-10-CM | POA: Diagnosis not present

## 2023-01-13 DIAGNOSIS — M9901 Segmental and somatic dysfunction of cervical region: Secondary | ICD-10-CM | POA: Diagnosis not present

## 2023-01-13 DIAGNOSIS — M9903 Segmental and somatic dysfunction of lumbar region: Secondary | ICD-10-CM | POA: Diagnosis not present

## 2023-01-13 DIAGNOSIS — M9907 Segmental and somatic dysfunction of upper extremity: Secondary | ICD-10-CM | POA: Diagnosis not present

## 2023-01-13 DIAGNOSIS — M9902 Segmental and somatic dysfunction of thoracic region: Secondary | ICD-10-CM | POA: Diagnosis not present

## 2023-01-18 DIAGNOSIS — M9901 Segmental and somatic dysfunction of cervical region: Secondary | ICD-10-CM | POA: Diagnosis not present

## 2023-01-18 DIAGNOSIS — M9902 Segmental and somatic dysfunction of thoracic region: Secondary | ICD-10-CM | POA: Diagnosis not present

## 2023-01-18 DIAGNOSIS — M9903 Segmental and somatic dysfunction of lumbar region: Secondary | ICD-10-CM | POA: Diagnosis not present

## 2023-01-18 DIAGNOSIS — M9907 Segmental and somatic dysfunction of upper extremity: Secondary | ICD-10-CM | POA: Diagnosis not present

## 2023-01-25 DIAGNOSIS — M9901 Segmental and somatic dysfunction of cervical region: Secondary | ICD-10-CM | POA: Diagnosis not present

## 2023-01-25 DIAGNOSIS — M9902 Segmental and somatic dysfunction of thoracic region: Secondary | ICD-10-CM | POA: Diagnosis not present

## 2023-01-25 DIAGNOSIS — M9903 Segmental and somatic dysfunction of lumbar region: Secondary | ICD-10-CM | POA: Diagnosis not present

## 2023-05-19 DIAGNOSIS — H547 Unspecified visual loss: Secondary | ICD-10-CM | POA: Diagnosis not present

## 2023-05-19 DIAGNOSIS — G8929 Other chronic pain: Secondary | ICD-10-CM | POA: Diagnosis not present

## 2023-05-19 DIAGNOSIS — M8580 Other specified disorders of bone density and structure, unspecified site: Secondary | ICD-10-CM | POA: Diagnosis not present

## 2023-05-19 DIAGNOSIS — E559 Vitamin D deficiency, unspecified: Secondary | ICD-10-CM | POA: Diagnosis not present

## 2023-05-19 DIAGNOSIS — E782 Mixed hyperlipidemia: Secondary | ICD-10-CM | POA: Diagnosis not present

## 2023-05-19 DIAGNOSIS — M1990 Unspecified osteoarthritis, unspecified site: Secondary | ICD-10-CM | POA: Diagnosis not present

## 2023-05-19 DIAGNOSIS — I10 Essential (primary) hypertension: Secondary | ICD-10-CM | POA: Diagnosis not present

## 2023-05-19 DIAGNOSIS — K219 Gastro-esophageal reflux disease without esophagitis: Secondary | ICD-10-CM | POA: Diagnosis not present

## 2023-05-19 DIAGNOSIS — E663 Overweight: Secondary | ICD-10-CM | POA: Diagnosis not present

## 2023-07-05 DIAGNOSIS — M25511 Pain in right shoulder: Secondary | ICD-10-CM | POA: Diagnosis not present

## 2023-07-05 DIAGNOSIS — M9901 Segmental and somatic dysfunction of cervical region: Secondary | ICD-10-CM | POA: Diagnosis not present

## 2023-07-05 DIAGNOSIS — M9906 Segmental and somatic dysfunction of lower extremity: Secondary | ICD-10-CM | POA: Diagnosis not present

## 2023-07-05 DIAGNOSIS — M9902 Segmental and somatic dysfunction of thoracic region: Secondary | ICD-10-CM | POA: Diagnosis not present

## 2023-07-05 DIAGNOSIS — M25512 Pain in left shoulder: Secondary | ICD-10-CM | POA: Diagnosis not present

## 2023-07-05 DIAGNOSIS — M9907 Segmental and somatic dysfunction of upper extremity: Secondary | ICD-10-CM | POA: Diagnosis not present

## 2023-07-05 DIAGNOSIS — M25562 Pain in left knee: Secondary | ICD-10-CM | POA: Diagnosis not present

## 2023-07-05 DIAGNOSIS — M9905 Segmental and somatic dysfunction of pelvic region: Secondary | ICD-10-CM | POA: Diagnosis not present

## 2023-07-05 DIAGNOSIS — M9903 Segmental and somatic dysfunction of lumbar region: Secondary | ICD-10-CM | POA: Diagnosis not present

## 2023-07-06 DIAGNOSIS — M9901 Segmental and somatic dysfunction of cervical region: Secondary | ICD-10-CM | POA: Diagnosis not present

## 2023-07-06 DIAGNOSIS — M25511 Pain in right shoulder: Secondary | ICD-10-CM | POA: Diagnosis not present

## 2023-07-06 DIAGNOSIS — M9903 Segmental and somatic dysfunction of lumbar region: Secondary | ICD-10-CM | POA: Diagnosis not present

## 2023-07-06 DIAGNOSIS — M25562 Pain in left knee: Secondary | ICD-10-CM | POA: Diagnosis not present

## 2023-07-06 DIAGNOSIS — M9907 Segmental and somatic dysfunction of upper extremity: Secondary | ICD-10-CM | POA: Diagnosis not present

## 2023-07-06 DIAGNOSIS — M9902 Segmental and somatic dysfunction of thoracic region: Secondary | ICD-10-CM | POA: Diagnosis not present

## 2023-07-06 DIAGNOSIS — M9906 Segmental and somatic dysfunction of lower extremity: Secondary | ICD-10-CM | POA: Diagnosis not present

## 2023-07-06 DIAGNOSIS — M9905 Segmental and somatic dysfunction of pelvic region: Secondary | ICD-10-CM | POA: Diagnosis not present

## 2023-07-06 DIAGNOSIS — M25512 Pain in left shoulder: Secondary | ICD-10-CM | POA: Diagnosis not present

## 2023-07-12 NOTE — Progress Notes (Signed)
 [facility] [address], [address] [place], [place] [address] [phone]  Date:  07/18/2023   Name:  Maria [name]   DOB:  11/19/1950   MRN:  [id number]  PCP:  Kaylee Partridge, MD    Chief Complaint: shoulder pain and R knee pain (Concerns/ questions: pt would like to make sure her vit D levels are okay)   History of Present Illness:  Maria [name] is a 73 y.o. very pleasant female patient who presents with the following:  Patient seen today with concern of fatigue and joint pain.  Most recent visit with myself was in September History of dyslipidemia, obesity, hypertension, osteoporosis, prediabetes, GERD  We got lab work in September, everything looked fine.  A1c 6.3%  She is taking OTC vitamin D  after finishing high dose course - would like to check her level today  She is having pain in her knees, more on the right side It will catch and feels unstable She has noticed this for about a month No falls or injury It does not hurt all the time but will "catch" and be very painful No swelling noted   Finally, she has noticed some right foot discomfort for the last several months.  This hurts if she stands for long period of time.  No acute injury that she is aware of  She is seeing a chiro for right shoulder pain She did have neck surgery years ago - May 2012 per Dr Jackee Marus DISCHARGE DIAGNOSES: 1. Right-sided cervical radiculopathy. 2. C5-6 and C6-7 degenerative disk disease. PROCEDURE PERFORMED:  C5-6, C6-7 anterior cervical decompression and fusion on Jul 22, 2010.  Recommend Shingrix Recommend COVID booster Bone density, mammogram, colonoscopy all up to date  No tobacco, no alcohol   BP Readings from Last 3 Encounters:  07/18/23 132/80  11/24/22 (!) 150/87  11/19/22 (!) 142/82    Patient Active Problem List   Diagnosis Date Noted   Atherosclerosis of aorta (HCC) 02/17/2022   Mixed dyslipidemia  06/30/2021   Obesity (BMI 30.0-34.9) 06/30/2021   Cardiac murmur 06/30/2021   Arthritis 06/24/2021   Colon polyps 06/24/2021   Diverticulosis 06/24/2021   Hemorrhoids, external without complications 06/24/2021   Hemorrhoids, internal 06/24/2021   Hypertension 06/24/2021   Osteoporosis 06/24/2021   Tubular adenoma of colon 06/24/2021   Prediabetes 10/13/2015   Gastro-esophageal reflux disease with esophagitis 11/20/2014   HH (hiatus hernia) 2014   NONSPECIFIC ABN FINDING RAD & OTH EXAM GI TRACT 09/19/2009    Past Medical History:  Diagnosis Date   Arthritis    Cardiac murmur 06/30/2021   Colon polyps    Diverticulosis    Gastro-esophageal reflux disease with esophagitis 11/20/2014   Endoscopy 10/2014 per DR. Hung   GERD (gastroesophageal reflux disease)    Hemorrhoids, external without complications    Hemorrhoids, internal    HH (hiatus hernia) 2014   HH (hiatus hernia) 2014   Hypertension    Mixed dyslipidemia 06/30/2021   NONSPECIFIC ABN FINDING RAD & OTH EXAM GI TRACT 09/19/2009   Qualifier: Diagnosis of  By: Howard Macho MD, Arleen Lacer    Obesity (BMI 30.0-34.9) 06/30/2021   Osteoporosis    pre   Prediabetes 10/13/2015   Tubular adenoma of colon     Past Surgical History:  Procedure Laterality Date   COLONOSCOPY     [id number]   ESOPHAGEAL MANOMETRY N/A 12/13/2014   Procedure: ESOPHAGEAL MANOMETRY (EM);  Surgeon: Alvis Jourdain,  MD;  Location: [facility];  Service: Endoscopy;  Laterality: N/A;   POLYPECTOMY     SPINE SURGERY     Neck   UPPER GASTROINTESTINAL ENDOSCOPY      Social History   Tobacco Use   Smoking status: Never   Smokeless tobacco: Never  Vaping Use   Vaping status: Never Used  Substance Use Topics   Alcohol use: No    Alcohol/week: 0.0 standard drinks of alcohol   Drug use: No    Family History  Problem Relation Age of Onset   Other Mother        smoke inhalation   Hypertension Father    Arthritis Brother    Colon cancer Neg Hx    Rectal  cancer Neg Hx    Esophageal cancer Neg Hx    Stomach cancer Neg Hx    Pancreatic cancer Neg Hx    Prostate cancer Neg Hx    Colon polyps Neg Hx     No Known Allergies  Medication list has been reviewed and updated.  Current Outpatient Medications on File Prior to Visit  Medication Sig Dispense Refill   acetaminophen (TYLENOL) 325 MG tablet Take 650 mg by mouth every 6 (six) hours as needed for pain or headache.     b complex vitamins tablet Take 1 tablet by mouth daily.     celecoxib  (CELEBREX ) 100 MG capsule Take 1 capsule (100 mg total) by mouth 2 (two) times daily. Use as needed for arthritis pain 60 capsule 1   hydrochlorothiazide  (HYDRODIURIL ) 12.5 MG tablet Take 1 tablet (12.5 mg total) by mouth daily. 90 tablet 3   Multiple Vitamin (MULTIVITAMIN) tablet Take 1 tablet by mouth daily.     naproxen  (NAPROSYN ) 500 MG tablet Take 1 tablet (500 mg total) by mouth 2 (two) times daily with a meal. 30 tablet 3   omeprazole  (PRILOSEC) 20 MG capsule Take 1 capsule (20 mg total) by mouth daily. 90 capsule 3   Peppermint Oil (IBGARD) 90 MG CPCR Take as directed 12 capsule 0   polyethylene glycol (MIRALAX ) 17 g packet Take 17 g by mouth daily. Titrate up as needed to achieve 1 bowel movement daily 14 each 0   VITAMIN D , CHOLECALCIFEROL, PO Take by mouth.     rosuvastatin  (CRESTOR ) 10 MG tablet Take 1 tablet (10 mg total) by mouth daily. 90 tablet 3   No current facility-administered medications on file prior to visit.    Review of Systems:  As per HPI- otherwise negative.   Physical Examination: Vitals:   07/18/23 1255 07/18/23 1321  BP: (!) 140/82 132/80  Pulse: 67   Resp: 18   Temp: 98.1 F (36.7 C)   SpO2: 97%    Vitals:   07/18/23 1255  Weight: 180 lb 12.8 oz (82 kg)  Height: 5\' 6"  (1.676 m)   Body mass index is 29.18 kg/m. Ideal Body Weight: Weight in (lb) to have BMI = 25: 154.6  GEN: no acute distress.  Mild overweight for age, looks well  HEENT: Atraumatic,  Normocephalic.  Ears and Nose: No external deformity. CV: RRR, No M/G/R. No JVD. No thrill. No extra heart sounds. PULM: CTA B, no wheezes, crackles, rhonchi. No retractions. No resp. distress. No accessory muscle use. EXTR: No c/c/e PSYCH: Normally interactive. Conversant.  Right shoulder: Normal range of motion but she has discomfort with full abduction and flexion.  Some discomfort over the rotator cuff tendon insertion.  Normal strength of both upper extremities, negative  empty can testing Knees: Significant crepitus bilaterally, no effusion.  Right knee does not show any other specific abnormality on exam Right foot: Patient notes discomfort in the lateral metatarsals, no redness or swelling.  No bruise Normal pedal pulses Achilles intact Assessment and Plan: Acute pain of right shoulder - Plan: Ambulatory referral to Orthopedic Surgery  Acute pain of right knee - Plan: Ambulatory referral to Orthopedic Surgery  Vitamin D  deficiency - Plan: VITAMIN D  25 Hydroxy (Vit-D Deficiency, Fractures)  Pre-diabetes - Plan: Hemoglobin A1c  Right foot pain - Plan: DG Foot Complete Right  Medication monitoring encounter - Plan: Basic metabolic panel with GFR  Encounter for screening mammogram for malignant neoplasm of breast - Plan: MM 3D SCREENING MAMMOGRAM BILATERAL BREAST  Patient seen today with a couple of concerns.  She has history of cervical spine surgery and has been noticing some issues with her right shoulder.  On exam I think this may be a separate issue-she is already seeing a chiropractor for her neck.  Referral to orthopedics to evaluate her right shoulder  I suspect she has a meniscal tear in the right knee, will also ask orthopedics to evaluate her knee pain  Obtain plain films of her right foot today  Follow-up on prediabetes, diuretic use, vitamin D  deficiency Signed Gates Kasal, MD  Received labs as below, 4/29.  Message to patient  Results for orders placed or  performed in visit on 07/18/23  Hemoglobin A1c   Collection Time: 07/18/23  1:25 PM  Result Value Ref Range   Hgb A1c MFr Bld 6.1 4.6 - 6.5 %  VITAMIN D  25 Hydroxy (Vit-D Deficiency, Fractures)   Collection Time: 07/18/23  1:25 PM  Result Value Ref Range   VITD 27.46 (L) 30.00 - 100.00 ng/mL  Basic metabolic panel with GFR   Collection Time: 07/18/23  1:25 PM  Result Value Ref Range   Sodium 137 135 - 145 mEq/L   Potassium 4.2 3.5 - 5.1 mEq/L   Chloride 102 96 - 112 mEq/L   CO2 26 19 - 32 mEq/L   Glucose, Bld 85 70 - 99 mg/dL   BUN 17 6 - 23 mg/dL   Creatinine, Ser 2.95 0.40 - 1.20 mg/dL   GFR 62.13 >08.65 mL/min   Calcium  9.9 8.4 - 10.5 mg/dL

## 2023-07-12 NOTE — Patient Instructions (Addendum)
 It was good to see you again today Recommend the shingles vaccine series at your pharmacy-I think you may have only received 1 dose.  I would suggest getting one more dose to make sure you are covered!  Also recommend 1 dose of RSV vaccine and COVID booster if none the last 6 months or so  We will get you seen by orthopedics to look at your right shoulder and right knee Please stop by imaging on the ground floor today to have an x-ray of your right foot

## 2023-07-13 DIAGNOSIS — M9903 Segmental and somatic dysfunction of lumbar region: Secondary | ICD-10-CM | POA: Diagnosis not present

## 2023-07-13 DIAGNOSIS — M25562 Pain in left knee: Secondary | ICD-10-CM | POA: Diagnosis not present

## 2023-07-13 DIAGNOSIS — M9901 Segmental and somatic dysfunction of cervical region: Secondary | ICD-10-CM | POA: Diagnosis not present

## 2023-07-13 DIAGNOSIS — M25512 Pain in left shoulder: Secondary | ICD-10-CM | POA: Diagnosis not present

## 2023-07-13 DIAGNOSIS — M25511 Pain in right shoulder: Secondary | ICD-10-CM | POA: Diagnosis not present

## 2023-07-13 DIAGNOSIS — M9907 Segmental and somatic dysfunction of upper extremity: Secondary | ICD-10-CM | POA: Diagnosis not present

## 2023-07-13 DIAGNOSIS — M9902 Segmental and somatic dysfunction of thoracic region: Secondary | ICD-10-CM | POA: Diagnosis not present

## 2023-07-13 DIAGNOSIS — M9905 Segmental and somatic dysfunction of pelvic region: Secondary | ICD-10-CM | POA: Diagnosis not present

## 2023-07-13 DIAGNOSIS — M9906 Segmental and somatic dysfunction of lower extremity: Secondary | ICD-10-CM | POA: Diagnosis not present

## 2023-07-18 ENCOUNTER — Ambulatory Visit: Admitting: Family Medicine

## 2023-07-18 ENCOUNTER — Encounter: Payer: Self-pay | Admitting: Family Medicine

## 2023-07-18 VITALS — BP 132/80 | HR 67 | Temp 98.1°F | Resp 18 | Ht 66.0 in | Wt 180.8 lb

## 2023-07-18 DIAGNOSIS — R7303 Prediabetes: Secondary | ICD-10-CM

## 2023-07-18 DIAGNOSIS — M25511 Pain in right shoulder: Secondary | ICD-10-CM | POA: Diagnosis not present

## 2023-07-18 DIAGNOSIS — M25561 Pain in right knee: Secondary | ICD-10-CM | POA: Diagnosis not present

## 2023-07-18 DIAGNOSIS — E559 Vitamin D deficiency, unspecified: Secondary | ICD-10-CM | POA: Diagnosis not present

## 2023-07-18 DIAGNOSIS — M79671 Pain in right foot: Secondary | ICD-10-CM

## 2023-07-18 DIAGNOSIS — Z5181 Encounter for therapeutic drug level monitoring: Secondary | ICD-10-CM

## 2023-07-18 DIAGNOSIS — Z1231 Encounter for screening mammogram for malignant neoplasm of breast: Secondary | ICD-10-CM

## 2023-07-19 ENCOUNTER — Encounter: Payer: Self-pay | Admitting: Family Medicine

## 2023-07-19 LAB — BASIC METABOLIC PANEL WITH GFR
BUN: 17 mg/dL (ref 6–23)
CO2: 26 meq/L (ref 19–32)
Calcium: 9.9 mg/dL (ref 8.4–10.5)
Chloride: 102 meq/L (ref 96–112)
Creatinine, Ser: 0.9 mg/dL (ref 0.40–1.20)
GFR: 63.37 mL/min (ref >60.00)
Glucose, Bld: 85 mg/dL (ref 70–99)
Potassium: 4.2 meq/L (ref 3.5–5.1)
Sodium: 137 meq/L (ref 135–145)

## 2023-07-19 LAB — HEMOGLOBIN A1C: Hgb A1c MFr Bld: 6.1 % (ref 4.6–6.5)

## 2023-07-19 LAB — VITAMIN D 25 HYDROXY (VIT D DEFICIENCY, FRACTURES): VITD: 27.46 ng/mL — ABNORMAL LOW (ref 30.00–100.00)

## 2023-07-26 ENCOUNTER — Encounter: Payer: Self-pay | Admitting: Orthopaedic Surgery

## 2023-07-26 ENCOUNTER — Ambulatory Visit: Admitting: Orthopaedic Surgery

## 2023-07-26 ENCOUNTER — Other Ambulatory Visit (INDEPENDENT_AMBULATORY_CARE_PROVIDER_SITE_OTHER): Payer: Self-pay

## 2023-07-26 DIAGNOSIS — G8929 Other chronic pain: Secondary | ICD-10-CM | POA: Diagnosis not present

## 2023-07-26 DIAGNOSIS — M25561 Pain in right knee: Secondary | ICD-10-CM | POA: Diagnosis not present

## 2023-07-26 DIAGNOSIS — M542 Cervicalgia: Secondary | ICD-10-CM | POA: Diagnosis not present

## 2023-07-26 MED ORDER — BUPIVACAINE HCL 0.5 % IJ SOLN
2.0000 mL | INTRAMUSCULAR | Status: AC | PRN
Start: 2023-07-26 — End: 2023-07-26
  Administered 2023-07-26: 2 mL via INTRA_ARTICULAR

## 2023-07-26 MED ORDER — METHYLPREDNISOLONE ACETATE 40 MG/ML IJ SUSP
40.0000 mg | INTRAMUSCULAR | Status: AC | PRN
Start: 2023-07-26 — End: 2023-07-26
  Administered 2023-07-26: 40 mg via INTRA_ARTICULAR

## 2023-07-26 MED ORDER — LIDOCAINE HCL 1 % IJ SOLN
2.0000 mL | INTRAMUSCULAR | Status: AC | PRN
Start: 2023-07-26 — End: 2023-07-26
  Administered 2023-07-26: 2 mL

## 2023-07-26 NOTE — Progress Notes (Signed)
 Office Visit Note   Patient: Maria [name]           Date of Birth: 10-14-1950           MRN: [id number] Visit Date: 07/26/2023              Requested by: Kaylee Partridge, MD [address] [place],  [place] [address] PCP: Kaylee Partridge, MD   Assessment & Plan: Visit Diagnoses:  1. Chronic pain of right knee   2. Neck pain     Plan: History of Present Illness Maria [name] is a 73 year old female with arthritis who presents with right knee and right shoulder pain.  She experiences flares of arthritis pain in her posterior right shoulder, with stiffness and occasional radiation to the back of her neck. She limits weight lifting to twelve pounds due to past neck surgery.  Her right knee feels weak, with crepitus described as popping and crackling. 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 She does not use a knee brace.  Physical Exam MUSCULOSKELETAL: Right shoulder with normal range of motion, muscle soreness.  Excellent strength to manual muscle testing of the rotator cuff.  Negative Spurling's.  Right knee with crepitus and arthritis.  No joint effusion.  Collaterals and cruciates are stable.  Results RADIOLOGY Neck X-ray: Prior ACDF without any complications. Right knee X-ray: Advanced patellofemoral arthritis, no bone-on-bone contact between femur and tibia, presence of osteophytes  Assessment and Plan Osteoarthritis of right knee Chronic osteoarthritis with crepitus with disease most severe in patellofemoral compartment. X-rays show spurring and significant arthritis.  Try cortisone injection. - Administer cortisone injection, limit to three to four times a year. - Recommend knee brace for support during ambulation, standing, or using stairs.  Muscle pain in right shoulder Intermittent muscle pain associated with arthritis flares, likely involving trapezius muscle. History of neck  surgery in 2012 may contribute. - Consider referral to acupuncture for muscle tightness and pain relief.  Follow-Up Instructions: No follow-ups on file.   Orders:  Orders Placed This Encounter  Procedures   Large Joint Inj: R knee   XR KNEE 3 VIEW RIGHT   XR Cervical Spine 2 or 3 views   No orders of the defined types were placed in this encounter.     Procedures: Large Joint Inj: R knee on 07/26/2023 9:20 AM Indications: pain Details: 22 G needle  Arthrogram: No  Medications: 40 mg methylPREDNISolone  acetate 40 MG/ML; 2 mL lidocaine  1 %; 2 mL bupivacaine 0.5 % Consent was given by the patient. Patient was prepped and draped in the usual sterile fashion.       Clinical Data: No additional findings.   Subjective: Chief Complaint  Patient presents with   Right Knee - Pain   Neck - Pain    HPI  Review of Systems  Constitutional: Negative.   HENT: Negative.    Eyes: Negative.   Respiratory: Negative.    Cardiovascular: Negative.   Endocrine: Negative.   Musculoskeletal: Negative.   Neurological: Negative.   Hematological: Negative.   Psychiatric/Behavioral: Negative.    All other systems reviewed and are negative.    Objective: Vital Signs: There were no vitals taken for this visit.  Physical Exam Vitals and nursing note reviewed.  Constitutional:      Appearance: She is well-developed.  HENT:     Head: Atraumatic.  Nose: Nose normal.  Eyes:     Extraocular Movements: Extraocular movements intact.  Cardiovascular:     Pulses: Normal pulses.  Pulmonary:     Effort: Pulmonary effort is normal.  Abdominal:     Palpations: Abdomen is soft.  Musculoskeletal:     Cervical back: Neck supple.  Skin:    General: Skin is warm.     Capillary Refill: Capillary refill takes less than 2 seconds.  Neurological:     Mental Status: She is alert. Mental status is at baseline.  Psychiatric:        Behavior: Behavior normal.        Thought Content:  Thought content normal.        Judgment: Judgment normal.     Ortho Exam  Specialty Comments:  No specialty comments available.  Imaging: XR KNEE 3 VIEW RIGHT Result Date: 07/26/2023 X-rays of the right knee show advanced patellofemoral compartment osteoarthritis.  Mild to moderate femoral-tibial disease.  No acute abnormalities.  XR Cervical Spine 2 or 3 views Result Date: 07/26/2023 X-rays of the cervical spine show prior ACDF with intact hardware without any complication.  No acute abnormalities.    PMFS History: Patient Active Problem List   Diagnosis Date Noted   Atherosclerosis of aorta (HCC) 02/17/2022   Mixed dyslipidemia 06/30/2021   Obesity (BMI 30.0-34.9) 06/30/2021   Cardiac murmur 06/30/2021   Arthritis 06/24/2021   Colon polyps 06/24/2021   Diverticulosis 06/24/2021   Hemorrhoids, external without complications 06/24/2021   Hemorrhoids, internal 06/24/2021   Hypertension 06/24/2021   Osteoporosis 06/24/2021   Tubular adenoma of colon 06/24/2021   Prediabetes 10/13/2015   Gastro-esophageal reflux disease with esophagitis 11/20/2014   HH (hiatus hernia) 2014   NONSPECIFIC ABN FINDING RAD & OTH EXAM GI TRACT 09/19/2009   Past Medical History:  Diagnosis Date   Arthritis    Cardiac murmur 06/30/2021   Colon polyps    Diverticulosis    Gastro-esophageal reflux disease with esophagitis 11/20/2014   Endoscopy 10/2014 per DR. Hung   GERD (gastroesophageal reflux disease)    Hemorrhoids, external without complications    Hemorrhoids, internal    HH (hiatus hernia) 2014   HH (hiatus hernia) 2014   Hypertension    Mixed dyslipidemia 06/30/2021   NONSPECIFIC ABN FINDING RAD & OTH EXAM GI TRACT 09/19/2009   Qualifier: Diagnosis of  By: Howard Macho MD, Arleen Lacer    Obesity (BMI 30.0-34.9) 06/30/2021   Osteoporosis    pre   Prediabetes 10/13/2015   Tubular adenoma of colon     Family History  Problem Relation Age of Onset   Other Mother        smoke inhalation    Hypertension Father    Arthritis Brother    Colon cancer Neg Hx    Rectal cancer Neg Hx    Esophageal cancer Neg Hx    Stomach cancer Neg Hx    Pancreatic cancer Neg Hx    Prostate cancer Neg Hx    Colon polyps Neg Hx     Past Surgical History:  Procedure Laterality Date   COLONOSCOPY     [id number]   ESOPHAGEAL MANOMETRY N/A 12/13/2014   Procedure: ESOPHAGEAL MANOMETRY (EM);  Surgeon: Alvis Jourdain, MD;  Location: [facility];  Service: Endoscopy;  Laterality: N/A;   POLYPECTOMY     SPINE SURGERY     Neck   UPPER GASTROINTESTINAL ENDOSCOPY     Social History   Occupational History   Occupation: Retired  Tobacco  Use   Smoking status: Never   Smokeless tobacco: Never  Vaping Use   Vaping status: Never Used  Substance and Sexual Activity   Alcohol use: No    Alcohol/week: 0.0 standard drinks of alcohol   Drug use: No   Sexual activity: Never

## 2023-09-07 ENCOUNTER — Telehealth: Payer: Self-pay | Admitting: *Deleted

## 2023-09-07 NOTE — Telephone Encounter (Signed)
 Left message and sent mychart message to r/s AWV on 10/20/23.

## 2023-09-16 NOTE — Telephone Encounter (Signed)
 Mychart message has not been read. Mailed letter to pt to r/s [facility] with me and r/s cpe to Dr Watt instead of Leita Elbe.

## 2023-10-11 ENCOUNTER — Telehealth: Payer: Self-pay | Admitting: Family Medicine

## 2023-10-11 NOTE — Telephone Encounter (Signed)
 Good morning I need lab orders for this pt

## 2023-10-20 ENCOUNTER — Ambulatory Visit

## 2023-10-20 ENCOUNTER — Other Ambulatory Visit

## 2023-10-20 ENCOUNTER — Encounter (HOSPITAL_BASED_OUTPATIENT_CLINIC_OR_DEPARTMENT_OTHER): Payer: Self-pay

## 2023-10-20 ENCOUNTER — Inpatient Hospital Stay (HOSPITAL_BASED_OUTPATIENT_CLINIC_OR_DEPARTMENT_OTHER): Admission: RE | Admit: 2023-10-20 | Source: Ambulatory Visit

## 2023-10-21 ENCOUNTER — Encounter: Admitting: Family

## 2023-10-25 NOTE — Progress Notes (Signed)
 [facility] [address], [address] [place], [place] [address] [phone]  Date:  10/31/2023   Name:  Maria [name]   DOB:  April 27, 1950   MRN:  [id number]  PCP:  Watt Harlene BROCKS, MD    Chief Complaint: Annual Exam   History of Present Illness:  Maria [name] is a 73 y.o. very pleasant female patient who presents with the following:  Patient seen today for physical exam.  I saw her in April at which time she had concern of fatigue and joint pain  History of dyslipidemia, obesity, hypertension, osteoporosis, prediabetes, GERD  I referred her to orthopedics who evaluated her knee and shoulder- she got a steroid shot which did help her some   She had some blood work in April  I am not sure if she ever received her second dose of Shingrix-recommended that she have this done DEXA scan is up-to-date Mammogram can be updated Colonoscopy 2021 She takes vitamin D  at times but not consistently  Pap; she was seen by GYN last year who confirmed she no longer needs screening   HCTZ 12.5 Omeprazole  MiraLAX  as needed Crestor  10 Patient Active Problem List   Diagnosis Date Noted   Atherosclerosis of aorta (HCC) 02/17/2022   Mixed dyslipidemia 06/30/2021   Obesity (BMI 30.0-34.9) 06/30/2021   Cardiac murmur 06/30/2021   Arthritis 06/24/2021   Colon polyps 06/24/2021   Diverticulosis 06/24/2021   Hemorrhoids, external without complications 06/24/2021   Hemorrhoids, internal 06/24/2021   Hypertension 06/24/2021   Osteoporosis 06/24/2021   Tubular adenoma of colon 06/24/2021   Prediabetes 10/13/2015   Gastro-esophageal reflux disease with esophagitis 11/20/2014   HH (hiatus hernia) 2014   NONSPECIFIC ABN FINDING RAD & OTH EXAM GI TRACT 09/19/2009    Past Medical History:  Diagnosis Date   Arthritis    Cardiac murmur 06/30/2021   Colon polyps    Diverticulosis    Gastro-esophageal reflux disease with esophagitis  11/20/2014   Endoscopy 10/2014 per DR. Hung   GERD (gastroesophageal reflux disease)    Hemorrhoids, external without complications    Hemorrhoids, internal    HH (hiatus hernia) 2014   HH (hiatus hernia) 2014   Hypertension    Mixed dyslipidemia 06/30/2021   NONSPECIFIC ABN FINDING RAD & OTH EXAM GI TRACT 09/19/2009   Qualifier: Diagnosis of  By: Teressa MD, Toribio SQUIBB    Obesity (BMI 30.0-34.9) 06/30/2021   Osteoporosis    pre   Prediabetes 10/13/2015   Tubular adenoma of colon     Past Surgical History:  Procedure Laterality Date   COLONOSCOPY     [id number]   ESOPHAGEAL MANOMETRY N/A 12/13/2014   Procedure: ESOPHAGEAL MANOMETRY (EM);  Surgeon: Belvie Just, MD;  Location: [facility];  Service: Endoscopy;  Laterality: N/A;   POLYPECTOMY     SPINE SURGERY     Neck   UPPER GASTROINTESTINAL ENDOSCOPY      Social History   Tobacco Use   Smoking status: Never   Smokeless tobacco: Never  Vaping Use   Vaping status: Never Used  Substance Use Topics   Alcohol use: No    Alcohol/week: 0.0 standard drinks of alcohol   Drug use: No    Family History  Problem Relation Age of Onset   Other Mother        smoke inhalation   Hypertension Father    Arthritis Brother    Colon cancer Neg Hx  Rectal cancer Neg Hx    Esophageal cancer Neg Hx    Stomach cancer Neg Hx    Pancreatic cancer Neg Hx    Prostate cancer Neg Hx    Colon polyps Neg Hx     No Known Allergies  Medication list has been reviewed and updated.  Current Outpatient Medications on File Prior to Visit  Medication Sig Dispense Refill   acetaminophen (TYLENOL) 325 MG tablet Take 650 mg by mouth every 6 (six) hours as needed for pain or headache.     b complex vitamins tablet Take 1 tablet by mouth daily.     hydrochlorothiazide  (HYDRODIURIL ) 12.5 MG tablet Take 1 tablet (12.5 mg total) by mouth daily. 90 tablet 3   Multiple Vitamin (MULTIVITAMIN) tablet Take 1 tablet by mouth daily.     naproxen  (NAPROSYN )  500 MG tablet Take 1 tablet (500 mg total) by mouth 2 (two) times daily with a meal. 30 tablet 3   omeprazole  (PRILOSEC) 20 MG capsule Take 1 capsule (20 mg total) by mouth daily. 90 capsule 3   Peppermint Oil (IBGARD) 90 MG CPCR Take as directed 12 capsule 0   polyethylene glycol (MIRALAX ) 17 g packet Take 17 g by mouth daily. Titrate up as needed to achieve 1 bowel movement daily 14 each 0   rosuvastatin  (CRESTOR ) 10 MG tablet Take 1 tablet (10 mg total) by mouth daily. 90 tablet 3   VITAMIN D , CHOLECALCIFEROL, PO Take by mouth.     No current facility-administered medications on file prior to visit.    Review of Systems:  As per HPI- otherwise negative.  BP Readings from Last 3 Encounters:  10/31/23 138/84  07/18/23 132/80  11/24/22 (!) 150/87     Physical Examination: Vitals:   10/31/23 0901  BP: 138/84  Pulse: 66  Temp: 97.9 F (36.6 C)  SpO2: 96%   Vitals:   10/31/23 0901  Weight: 187 lb (84.8 kg)  Height: 5' 6 (1.676 m)   Body mass index is 30.18 kg/m. Ideal Body Weight: Weight in (lb) to have BMI = 25: 154.6  GEN: no acute distress.  Mildly obese, looks well HEENT: Atraumatic, Normocephalic.  Bilateral TM wnl, oropharynx normal.  PEERL,EOMI.   Ears and Nose: No external deformity. CV: RRR, No M/G/R. No JVD. No thrill. No extra heart sounds. PULM: CTA B, no wheezes, crackles, rhonchi. No retractions. No resp. distress. No accessory muscle use. ABD: S, NT, ND, +BS. No rebound. No HSM. EXTR: No c/c/e PSYCH: Normally interactive. Conversant.    Assessment and Plan: Physical exam  Arthritis - Plan: celecoxib  (CELEBREX ) 100 MG capsule  Right hip pain - Plan: celecoxib  (CELEBREX ) 100 MG capsule  Right foot pain - Plan: celecoxib  (CELEBREX ) 100 MG capsule  Encounter for screening mammogram for malignant neoplasm of breast - Plan: MM 3D SCREENING MAMMOGRAM BILATERAL BREAST  Vitamin D  deficiency - Plan: VITAMIN D  25 Hydroxy (Vit-D Deficiency,  Fractures)  Pre-diabetes - Plan: Comprehensive metabolic panel with GFR  Screening, lipid - Plan: Lipid panel  Thyroid  disorder screening - Plan: TSH  Screening for deficiency anemia - Plan: CBC  Patient seen today for physical exam Ordered mammogram Blood work is pending as above Refilled Celebrex  which she uses as needed for arthritis Will plan further follow- up pending labs.   Signed Harlene Schroeder, MD  Received labs as below, message to patient  Results for orders placed or performed in visit on 10/31/23  VITAMIN D  25 Hydroxy (Vit-D Deficiency, Fractures)  Collection Time: 10/31/23  9:48 AM  Result Value Ref Range   VITD 26.98 (L) 30.00 - 100.00 ng/mL  Comprehensive metabolic panel with GFR   Collection Time: 10/31/23  9:48 AM  Result Value Ref Range   Sodium 138 135 - 145 mEq/L   Potassium 4.5 3.5 - 5.1 mEq/L   Chloride 100 96 - 112 mEq/L   CO2 29 19 - 32 mEq/L   Glucose, Bld 104 (H) 70 - 99 mg/dL   BUN 18 6 - 23 mg/dL   Creatinine, Ser 9.13 0.40 - 1.20 mg/dL   Total Bilirubin 0.5 0.2 - 1.2 mg/dL   Alkaline Phosphatase 91 39 - 117 U/L   AST 17 0 - 37 U/L   ALT 16 0 - 35 U/L   Total Protein 6.8 6.0 - 8.3 g/dL   Albumin 4.3 3.5 - 5.2 g/dL   GFR 33.20 >39.99 mL/min   Calcium  9.8 8.4 - 10.5 mg/dL  Lipid panel   Collection Time: 10/31/23  9:48 AM  Result Value Ref Range   Cholesterol 232 (H) 0 - 200 mg/dL   Triglycerides 20.9 0.0 - 149.0 mg/dL   HDL 43.89 >60.99 mg/dL   VLDL 84.1 0.0 - 59.9 mg/dL   LDL Cholesterol 839 (H) 0 - 99 mg/dL   Total CHOL/HDL Ratio 4    NonHDL 175.83   TSH   Collection Time: 10/31/23  9:48 AM  Result Value Ref Range   TSH 3.03 0.35 - 5.50 uIU/mL  CBC   Collection Time: 10/31/23  9:48 AM  Result Value Ref Range   WBC 7.8 4.0 - 10.5 K/uL   RBC 4.44 3.87 - 5.11 Mil/uL   Platelets 253.0 150.0 - 400.0 K/uL   Hemoglobin 13.3 12.0 - 15.0 g/dL   HCT 59.9 63.9 - 53.9 %   MCV 90.2 78.0 - 100.0 fl   MCHC 33.3 30.0 - 36.0 g/dL    RDW 87.1 88.4 - 84.4 %

## 2023-10-31 ENCOUNTER — Encounter: Payer: Self-pay | Admitting: Family Medicine

## 2023-10-31 ENCOUNTER — Ambulatory Visit (INDEPENDENT_AMBULATORY_CARE_PROVIDER_SITE_OTHER): Admitting: Family Medicine

## 2023-10-31 ENCOUNTER — Ambulatory Visit (HOSPITAL_BASED_OUTPATIENT_CLINIC_OR_DEPARTMENT_OTHER)
Admission: RE | Admit: 2023-10-31 | Discharge: 2023-10-31 | Disposition: A | Discharge status: Home / Self Care | Source: Ambulatory Visit | Attending: Family Medicine | Admitting: Family Medicine

## 2023-10-31 ENCOUNTER — Encounter (HOSPITAL_BASED_OUTPATIENT_CLINIC_OR_DEPARTMENT_OTHER): Payer: Self-pay

## 2023-10-31 VITALS — BP 138/84 | HR 66 | Temp 97.9°F | Ht 66.0 in | Wt 187.0 lb

## 2023-10-31 DIAGNOSIS — M25551 Pain in right hip: Secondary | ICD-10-CM | POA: Diagnosis not present

## 2023-10-31 DIAGNOSIS — M79671 Pain in right foot: Secondary | ICD-10-CM | POA: Diagnosis not present

## 2023-10-31 DIAGNOSIS — Z130 Encounter for screening for diseases of the blood and blood-forming organs and certain disorders involving the immune mechanism: Secondary | ICD-10-CM | POA: Diagnosis not present

## 2023-10-31 DIAGNOSIS — M1990 Unspecified osteoarthritis, unspecified site: Secondary | ICD-10-CM

## 2023-10-31 DIAGNOSIS — Z1231 Encounter for screening mammogram for malignant neoplasm of breast: Secondary | ICD-10-CM | POA: Insufficient documentation

## 2023-10-31 DIAGNOSIS — Z1329 Encounter for screening for other suspected endocrine disorder: Secondary | ICD-10-CM

## 2023-10-31 DIAGNOSIS — Z13220 Encounter for screening for lipoid disorders: Secondary | ICD-10-CM

## 2023-10-31 DIAGNOSIS — Z0000 Encounter for general adult medical examination without abnormal findings: Secondary | ICD-10-CM

## 2023-10-31 DIAGNOSIS — R7303 Prediabetes: Secondary | ICD-10-CM

## 2023-10-31 DIAGNOSIS — E559 Vitamin D deficiency, unspecified: Secondary | ICD-10-CM

## 2023-10-31 DIAGNOSIS — E782 Mixed hyperlipidemia: Secondary | ICD-10-CM

## 2023-10-31 LAB — COMPREHENSIVE METABOLIC PANEL WITH GFR
ALT: 16 U/L (ref 0–35)
AST: 17 U/L (ref 0–37)
Albumin: 4.3 g/dL (ref 3.5–5.2)
Alkaline Phosphatase: 91 U/L (ref 39–117)
BUN: 18 mg/dL (ref 6–23)
CO2: 29 meq/L (ref 19–32)
Calcium: 9.8 mg/dL (ref 8.4–10.5)
Chloride: 100 meq/L (ref 96–112)
Creatinine, Ser: 0.86 mg/dL (ref 0.40–1.20)
GFR: 66.79 mL/min (ref >60.00)
Glucose, Bld: 104 mg/dL — ABNORMAL HIGH (ref 70–99)
Potassium: 4.5 meq/L (ref 3.5–5.1)
Sodium: 138 meq/L (ref 135–145)
Total Bilirubin: 0.5 mg/dL (ref 0.2–1.2)
Total Protein: 6.8 g/dL (ref 6.0–8.3)

## 2023-10-31 LAB — LIPID PANEL
Cholesterol: 232 mg/dL — ABNORMAL HIGH (ref 0–200)
HDL: 56.1 mg/dL (ref >39.00)
LDL Cholesterol: 160 mg/dL — ABNORMAL HIGH (ref 0–99)
NonHDL: 175.83
Total CHOL/HDL Ratio: 4
Triglycerides: 79 mg/dL (ref 0.0–149.0)
VLDL: 15.8 mg/dL (ref 0.0–40.0)

## 2023-10-31 LAB — VITAMIN D 25 HYDROXY (VIT D DEFICIENCY, FRACTURES): VITD: 26.98 ng/mL — ABNORMAL LOW (ref 30.00–100.00)

## 2023-10-31 LAB — CBC
HCT: 40 % (ref 36.0–46.0)
Hemoglobin: 13.3 g/dL (ref 12.0–15.0)
MCHC: 33.3 g/dL (ref 30.0–36.0)
MCV: 90.2 fl (ref 78.0–100.0)
Platelets: 253 K/uL (ref 150.0–400.0)
RBC: 4.44 Mil/uL (ref 3.87–5.11)
RDW: 12.8 % (ref 11.5–15.5)
WBC: 7.8 K/uL (ref 4.0–10.5)

## 2023-10-31 LAB — TSH: TSH: 3.03 u[IU]/mL (ref 0.35–5.50)

## 2023-10-31 MED ORDER — ROSUVASTATIN CALCIUM 20 MG PO TABS
20.0000 mg | ORAL_TABLET | Freq: Every day | ORAL | 3 refills | Status: AC
Start: 2023-10-31 — End: 2024-10-25

## 2023-10-31 MED ORDER — CELECOXIB 100 MG PO CAPS
100.0000 mg | ORAL_CAPSULE | Freq: Two times a day (BID) | ORAL | 1 refills | Status: AC
Start: 2023-10-31 — End: ?

## 2023-10-31 NOTE — Patient Instructions (Addendum)
 It was good to see you today!  I found your note from Dr Jeralyn- you no longer need to do pap smears! Please set up your mammogram at your convenience Recommend flu shot and covid booster this fall Take care!

## 2023-11-28 ENCOUNTER — Ambulatory Visit: Payer: Self-pay

## 2023-11-28 NOTE — Telephone Encounter (Signed)
 FYI Only or Action Required?: FYI only for provider.  Patient was last seen in primary care on 10/31/2023 by Copland, Harlene BROCKS, MD.  Called Nurse Triage reporting leg cramps.  Symptoms began Sunday.  Interventions attempted: Nothing.  Symptoms are: unchanged.  Triage Disposition: See PCP Within 2 Weeks  Patient/caregiver understands and will follow disposition?: Yes        Copied from CRM [id number]. Topic: Clinical - Red Word Triage >> Nov 28, 2023 11:17 AM [place] E wrote: [facility] that prompted transfer to Nurse Triage: Leg cramps/pain in left calf muscle, causing difficulty to walk. Cramps have been going on for 3 days. Reason for Disposition  Leg pain or muscle cramp is a chronic symptom (recurrent or ongoing AND present > 4 weeks)  Answer Assessment - Initial Assessment Questions 1. ONSET: When did the pain start?      Sat Monday  2. LOCATION: Where is the pain located?      Left calf cramping and pain 3. PAIN: How bad is the pain?    (Scale 1-10; or mild, moderate, severe)     Sat: severe today: mild  4. WORK OR EXERCISE: Has there been any recent work or exercise that involved this part of the body?      walking 5. CAUSE: What do you think is causing the leg pain?     Leg cramp  6. OTHER SYMPTOMS: Do you have any other symptoms? (e.g., chest pain, back pain, breathing difficulty, swelling, rash, fever, numbness, weakness)     Muscle pain  Protocols used: Leg Pain-A-AH

## 2023-11-28 NOTE — Telephone Encounter (Signed)
 Appt scheduled

## 2023-11-29 ENCOUNTER — Ambulatory Visit: Admitting: Family Medicine

## 2024-01-23 ENCOUNTER — Encounter: Payer: Self-pay | Admitting: Radiology

## 2024-12-06 ENCOUNTER — Encounter: Admitting: Family Medicine
